# Patient Record
Sex: Male | Born: 1935 | Race: Black or African American | Hispanic: No | Smoking: Never smoker
Health system: Southern US, Community
[De-identification: ages and names within clinical notes are randomized; demographics above are authoritative.]

## PROBLEM LIST (undated history)

## (undated) DIAGNOSIS — E785 Hyperlipidemia, unspecified: Secondary | ICD-10-CM

## (undated) DIAGNOSIS — I739 Peripheral vascular disease, unspecified: Secondary | ICD-10-CM

## (undated) DIAGNOSIS — I779 Disorder of arteries and arterioles, unspecified: Secondary | ICD-10-CM

## (undated) DIAGNOSIS — R42 Dizziness and giddiness: Secondary | ICD-10-CM

## (undated) DIAGNOSIS — I341 Nonrheumatic mitral (valve) prolapse: Secondary | ICD-10-CM

## (undated) DIAGNOSIS — I10 Essential (primary) hypertension: Secondary | ICD-10-CM

## (undated) HISTORY — DX: Nonrheumatic mitral (valve) prolapse: I34.1

## (undated) HISTORY — DX: Essential (primary) hypertension: I10

## (undated) HISTORY — DX: Hyperlipidemia, unspecified: E78.5

## (undated) HISTORY — DX: Peripheral vascular disease, unspecified: I73.9

## (undated) HISTORY — DX: Disorder of arteries and arterioles, unspecified: I77.9

---

## 1985-06-12 HISTORY — PX: ABDOMINAL SURGERY: SHX537

## 2007-12-04 ENCOUNTER — Encounter: Admission: RE | Admit: 2007-12-04 | Discharge: 2007-12-04 | Payer: Self-pay | Admitting: Gastroenterology

## 2009-10-14 ENCOUNTER — Encounter: Payer: Self-pay | Admitting: Cardiology

## 2009-10-15 ENCOUNTER — Encounter: Payer: Self-pay | Admitting: Cardiology

## 2009-10-17 ENCOUNTER — Encounter: Payer: Self-pay | Admitting: Cardiology

## 2009-10-18 ENCOUNTER — Ambulatory Visit: Payer: Self-pay | Admitting: Cardiology

## 2009-10-19 ENCOUNTER — Encounter: Payer: Self-pay | Admitting: Cardiology

## 2009-11-03 ENCOUNTER — Ambulatory Visit: Payer: Self-pay | Admitting: Physician Assistant

## 2009-11-03 DIAGNOSIS — R079 Chest pain, unspecified: Secondary | ICD-10-CM

## 2009-11-03 DIAGNOSIS — E785 Hyperlipidemia, unspecified: Secondary | ICD-10-CM | POA: Insufficient documentation

## 2009-11-03 DIAGNOSIS — I10 Essential (primary) hypertension: Secondary | ICD-10-CM | POA: Insufficient documentation

## 2009-11-03 DIAGNOSIS — I25810 Atherosclerosis of coronary artery bypass graft(s) without angina pectoris: Secondary | ICD-10-CM | POA: Insufficient documentation

## 2009-11-03 DIAGNOSIS — I4510 Unspecified right bundle-branch block: Secondary | ICD-10-CM | POA: Insufficient documentation

## 2009-12-01 ENCOUNTER — Ambulatory Visit: Payer: Self-pay | Admitting: Vascular Surgery

## 2009-12-01 ENCOUNTER — Encounter: Payer: Self-pay | Admitting: Physician Assistant

## 2009-12-15 ENCOUNTER — Encounter: Admission: RE | Admit: 2009-12-15 | Discharge: 2009-12-15 | Payer: Self-pay | Admitting: Vascular Surgery

## 2009-12-15 ENCOUNTER — Encounter: Payer: Self-pay | Admitting: Cardiology

## 2009-12-15 ENCOUNTER — Ambulatory Visit: Payer: Self-pay | Admitting: Vascular Surgery

## 2010-02-07 ENCOUNTER — Ambulatory Visit: Payer: Self-pay | Admitting: Cardiology

## 2010-02-07 DIAGNOSIS — I739 Peripheral vascular disease, unspecified: Secondary | ICD-10-CM | POA: Insufficient documentation

## 2010-02-07 DIAGNOSIS — R0602 Shortness of breath: Secondary | ICD-10-CM

## 2010-02-16 ENCOUNTER — Encounter (INDEPENDENT_AMBULATORY_CARE_PROVIDER_SITE_OTHER): Payer: Self-pay | Admitting: *Deleted

## 2010-02-17 ENCOUNTER — Telehealth (INDEPENDENT_AMBULATORY_CARE_PROVIDER_SITE_OTHER): Payer: Self-pay | Admitting: *Deleted

## 2010-02-24 ENCOUNTER — Ambulatory Visit: Payer: Self-pay | Admitting: Cardiology

## 2010-02-24 ENCOUNTER — Encounter: Payer: Self-pay | Admitting: Cardiology

## 2010-02-28 ENCOUNTER — Ambulatory Visit: Payer: Self-pay | Admitting: Cardiology

## 2010-02-28 DIAGNOSIS — I059 Rheumatic mitral valve disease, unspecified: Secondary | ICD-10-CM

## 2010-07-12 NOTE — Progress Notes (Signed)
Summary: [facility] Oneida Alar  [facility]-Dr[facility] Fields   Imported By: Gurney Maxin, RN, BSN 12/03/2009 [id number]  _____________________________________________________________________  External Attachment:    Type:   Image     Comment:   External Document

## 2010-07-12 NOTE — Letter (Signed)
Summary: [facility]   [facility]   Imported By: Sallee Provencal 01/10/2010 16:13:15  _____________________________________________________________________  External Attachment:    Type:   Image     Comment:   External Document

## 2010-07-12 NOTE — Consult Note (Signed)
Summary: CARDIOLOGY CONSULT/ [place] [facility]/ [place]   Imported By: Delfino Lovett 11/02/2009 11:12:07  _____________________________________________________________________  External Attachment:    Type:   Image     Comment:   External Document

## 2010-07-12 NOTE — Letter (Signed)
Summary: [facility], general practice at [place]. [address]   [place], [place] [address]   Phone: [phone]  Fax: [phone]    02/16/2010  Jared Cruz [address], [place]  [address]  Dear Mr. Amodei,   Dr. Domenic Polite has requested that we schedule a 2-D Echo and a Pulmonary Function Test for you at [facility]. I have been unable to reach you by telephone. Please contact our office so that we may get these test set up for you.        Sincerely,   Delfino Lovett Patient Care Coordinator  [phone] ext.[place]
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
## 2010-07-12 NOTE — Letter (Signed)
Summary: [facility] D/C DR. Megan Salon  [place] D/C DR. Megan Salon   Imported By: Delfino Lovett 11/02/2009 11:07:58  _____________________________________________________________________  External Attachment:    Type:   Image     Comment:   External Document

## 2010-07-12 NOTE — Assessment & Plan Note (Signed)
Summary: 3 WK F/U PER 8/29 OV-JM   Visit Type:  Follow-up Primary Provider:  Dr. Olena Mater   History of Present Illness: 77 year old male presents for followup. I saw him back in late August. He underwent a followup 2-D echocardiogram as well as PFTs, reviewed below.  He reports NYHA class 2-3 dyspnea on exertion, no significant change. Reports improvement with use of inhalers. No chest pain or palpitations.  I reviewed the results of this testing, and discussed the implications. He will need followup of his valvular disease. Not entirely clear that this is symptom provoking at the present time.  Blood pressure is elevated today. He states that he does not check this at home. We did discuss other potential changes to his medications if this trend continues.  Preventive Screening-Counseling & Management  Alcohol-Tobacco     Smoking Status: never  Current Medications (verified): 1)  Proventil Hfa 108 (90 Base) Mcg/act Aers (Albuterol Sulfate) .... As Needed Every Four Hours 2)  Singulair 10 Mg Tabs (Montelukast Sodium) .... Take 1 Tablet By Mouth Once A Day 3)  Aspir-Low 81 Mg Tbec (Aspirin) .... Take 1 Tablet By Mouth Once A Day 4)  Amlodipine Besylate 10 Mg Tabs (Amlodipine Besylate) .... Take One Tablet By Mouth Daily 5)  Albuterol Sulfate (2.5 Mg/49ml) 0.083% Nebu (Albuterol Sulfate) .... Use As Directed 6)  Asmanex 60 Metered Doses 220 Mcg/inh Aepb (Mometasone Furoate) .... Use As Directed 7)  Epipen 2-Pak 0.3 Mg/0.33ml Devi (Epinephrine) .... Use As Directed 8)  Meloxicam 7.5 Mg Tabs (Meloxicam) .... Take 1 Tablet By Mouth Two Times A Day 9)  Simvastatin 20 Mg Tabs (Simvastatin) .... Take 1 Tablet By Mouth Once A Day  Allergies (verified): 1)  ! Ace Inhibitors  Comments:  Nurse/Medical Assistant: The patient is currently on medications but does not know the name or dosage at this time. Instructed to contact our office with details. Will update medication list at that  time.  Past History:  Social History: Last updated: 02/07/2010 Retired  Married  Tobacco Use - No Alcohol Use - no 5 grown children  Past Medical History: Asthma Hypertension Dyslipidemia Carotid artery disease Mitral Valve Prolapse - mild to moderate regurgitation Mild pulmonary hypertension  Review of Systems       The patient complains of dyspnea on exertion.  The patient denies anorexia, fever, weight loss, chest pain, syncope, peripheral edema, prolonged cough, melena, and hematochezia.         Otherwise reviewed and negative.  Vital Signs:  Patient profile:   77 year old male Height:      68 inches Weight:      179 pounds O2 Sat:      100 % on Room air Pulse rate:   63 / minute BP sitting:   155 / 87  (left arm) Cuff size:   regular  Vitals Entered By: Georgina Peer (February 28, 2010 8:33 AM)  O2 Flow:  Room air  Physical Exam  Additional Exam:  Overweight male in no acute distress. HEENT: Conjunctiva and lids normal, oropharynx with moist mucosa. NECK: Palpable pulses, soft right-sided bruit; no JVD; no TM. LUNGS: CTA bilaterally, decreased breath sounds, no wheezes. HEART: RRR (S1S2); split S2 with prominent P2; no significant murmurs; no rubs; no gallops. ABD: Soft, NT; intact BS. EXT: Intact distal pulses; no edema. SKIN: Warm, dry. MUSC: No obvious deformity. NEURO: A/O (x3), affect appropriate.    Echocardiogram  Procedure date:  02/24/2010  Findings:  LVEF [id number] with diastolic dysfunction, mild mitral valve prolapse, mild to moderate mitral regurgitation, mild to moderate right ventricular enlargement with moderate right atrial enlargement, mild to moderate tricuspid regurgitation, mild pulmonary hypertension with RVSP 44 mmHg.  Prior Report Reviewed for Stress Echocardiogram:  Findings: 10/18/2009 Stress Echocardiogram             Conclusions:         1. Patient followed a Bruce protocol. The patient exercised into stage          2 reaching peak heart rate 149 bpm, 101% MPHR.  The total exercise         duration was 04:36 minutes. The patient felt chest discomfort         consistent with angina. The study was terminated because of fatigue         and because peak was reached. There is lead motion artifact with         equivocal ST changes in leads II, III, aVF, V4-V6 near peak. The blood         pressure response is hypertensive. Exercise capacity is poor. The         patient achieved a level of 7 METS. This is an equivocal         electrocardiographic stress test.         2. This was a negative echocardiographic stress test. The left               ventricular chamber size is decreased. The left ventricle appears         hyperdynamic at peak without clear wall motion abnormalties to suggest         ischemia. The estimated ejection fraction is greater than 65%.                        Comments:    PFT  Procedure date:  02/24/2010  Findings:      FEC 2.1, 70% predicted. FEV1 1.9, 74% predicted. Normal expiratory loop. DLCO 16.9, 89% predicted.  Possible mild, primarily small airways obstruction. The FVC is reduced. This does suggest the possibility of a restrictive process.  Impression & Recommendations:  Problem # 1:  SHORTNESS OF BREATH (ICD-786.05)  Likely multifactorial. He does have evidence of small airways disease by PFTs, and reports improvement with use of inhalers. Consistent with reported history of asthma. He has evidence of increased right heart size as well as mild pulmonary hypertension by echocardiogram. Mitral valve prolapse is noted, although associated with only mild to moderate mitral regurgitation, and not clear if this is symptom provoking. Element of diastolic dysfunction and elevated blood pressures also to be considered.  His updated medication list for this problem includes:    Aspir-low 81 Mg Tbec (Aspirin) .Marland Kitchen... Take 1 tablet by mouth once a day    Amlodipine Besylate 10 Mg  Tabs (Amlodipine besylate) .Marland Kitchen... Take one tablet by mouth daily  Problem # 2:  HYPERTENSION (ICD-401.9)  Blood pressure elevated today. We discussed sodium restriction. He has an ACE inhibitor allergy. Next step might be considering a low dose diuretic such as HCTZ with potassium supplement. He plans to see Dr. Ree Edman soon for followup.  His updated medication list for this problem includes:    Aspir-low 81 Mg Tbec (Aspirin) .Marland Kitchen... Take 1 tablet by mouth once a day    Amlodipine Besylate 10 Mg Tabs (Amlodipine besylate) .Marland Kitchen... Take one tablet by mouth daily  Problem #  3:  MITRAL VALVE PROLAPSE (ICD-424.0)  Associated with mild to moderate mitral regurgitation. Continue clinical followup at this point.  Patient Instructions: 1)  Your physician recommends that you continue on your current medications as directed. Please refer to the Current Medication list given to you today. 2)  Follow up in  6 months

## 2010-07-12 NOTE — Progress Notes (Signed)
Summary: Pending Echo/PFT's  ---- Converted from flag ---- ---- 02/17/2010 8:41 AM, Delfino Lovett wrote:   ---- 02/16/2010 9:56 AM, Delfino Lovett wrote: LETTER MAILED TO PATIENT REQT THAT HE CALL OFFICE  ---- 02/07/2010 8:34 AM, Gurney Maxin, RN, BSN wrote: The following orders have been entered for this patient and placed on Admin Hold:  Type:     Referral       Code:   2D Echo Description:   2-D Echocardiogram Order Date:   02/07/2010   Authorized By:   Beckie Salts, MD, [facility] Order #:   [id number] Clinical Notes:    Type:     Referral       Code:   PFT Description:   Pulmonary Function Test Order Date:   02/07/2010   Authorized By:   Beckie Salts, MD, [facility] Order #:   [id number] Clinical Notes:   Bronchodilators? With ABG's? With DLCO? On Room Air? Does patient take Amiodorone? ------------------------------
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
## 2010-08-24 ENCOUNTER — Encounter: Payer: Self-pay | Admitting: *Deleted

## 2010-09-01 ENCOUNTER — Ambulatory Visit: Payer: Self-pay | Admitting: Cardiology

## 2010-09-20 ENCOUNTER — Other Ambulatory Visit: Payer: Self-pay | Admitting: Cardiology

## 2010-09-20 ENCOUNTER — Encounter: Payer: Self-pay | Admitting: Cardiology

## 2010-09-20 ENCOUNTER — Ambulatory Visit (INDEPENDENT_AMBULATORY_CARE_PROVIDER_SITE_OTHER): Payer: Medicare Other | Admitting: Cardiology

## 2010-09-20 VITALS — BP 142/82 | HR 75 | Ht 68.0 in | Wt 186.0 lb

## 2010-09-20 DIAGNOSIS — E782 Mixed hyperlipidemia: Secondary | ICD-10-CM

## 2010-09-20 DIAGNOSIS — I059 Rheumatic mitral valve disease, unspecified: Secondary | ICD-10-CM

## 2010-09-20 DIAGNOSIS — R0602 Shortness of breath: Secondary | ICD-10-CM

## 2010-09-20 DIAGNOSIS — J45909 Unspecified asthma, uncomplicated: Secondary | ICD-10-CM

## 2010-09-20 DIAGNOSIS — I10 Essential (primary) hypertension: Secondary | ICD-10-CM

## 2010-09-20 NOTE — Patient Instructions (Signed)
   2D Echo   Your physician wants you to follow up in: 6 months.  You will receive a reminder letter in the mail one-two months in advance.  If you don't receive a letter, please call our office to schedule the follow up appointment

## 2010-09-20 NOTE — Assessment & Plan Note (Signed)
Continue medical therapy. We also discussed sodium restriction. A low dose diuretic such as HCTZ might be considered. He has an ACE inhibitor allergy.

## 2010-09-20 NOTE — Assessment & Plan Note (Signed)
Relatively stable, and likely multifactorial. Continue observation.

## 2010-09-20 NOTE — Progress Notes (Signed)
Clinical Summary Jared Cruz is a 77 y.o.male presenting for followup. He was last seen in September 2011.  He reports no progressive problems with shortness of breath. He has had some issues with the strong pollen season however. No reported palpitations or chest pain.  Complains of significant left knee pain, plans to discuss this with his primary care physician. He has not been using regular pain medications other than over-the-counter Tylenol.  I rechecked his blood pressure today during his visit today, noted below. He reports compliance with his medications. We did discuss sodium restriction, also possibility of additional medical therapy.  Last echocardiogram as noted below.   Allergies  Allergen Reactions  . Ace Inhibitors     Current outpatient prescriptions:albuterol (PROVENTIL) (2.5 MG/3ML) 0.083% nebulizer solution, Take 2.5 mg by nebulization every 6 (six) hours as needed.  , Disp: , Rfl: ;  albuterol (PROVENTIL) 90 MCG/ACT inhaler, Inhale 2 puffs into the lungs every 6 (six) hours as needed.  , Disp: , Rfl: ;  amLODipine (NORVASC) 10 MG tablet, Take 10 mg by mouth daily.  , Disp: , Rfl: ;  aspirin 81 MG EC tablet, Take 81 mg by mouth daily.  , Disp: , Rfl:  EPINEPHrine (EPIPEN JR) 0.15 MG/0.3ML injection, Inject 0.15 mg into the muscle as needed.  , Disp: , Rfl: ;  HYDROcodone-acetaminophen (VICODIN) 5-500 MG per tablet, Take 1 tablet by mouth every 6 (six) hours as needed.  , Disp: , Rfl: ;  mometasone (ASMANEX 60 METERED DOSES) 220 MCG/INH inhaler, Inhale 2 puffs into the lungs daily. As directed  , Disp: , Rfl: ;  montelukast (SINGULAIR) 10 MG tablet, Take 10 mg by mouth at bedtime.  , Disp: , Rfl:  DISCONTD: meloxicam (MOBIC) 7.5 MG tablet, Take 7.5 mg by mouth 2 (two) times daily.  , Disp: , Rfl: ;  DISCONTD: simvastatin (ZOCOR) 20 MG tablet, Take 20 mg by mouth at bedtime.  , Disp: , Rfl:   Past Medical History  Diagnosis Date  . Asthma     PFT 9/11 - FEC 2.1, 70%  predicted; FEV1 1.9, 74% predicted; Normal expiratory loop; DLCO 16.9, 89% predicted   . Dyslipidemia   . Carotid artery disease   . Mitral valve prolapse     Mild to moderate regurgitation  . Essential hypertension, benign     Social History Jared Cruz reports that he has never smoked. He has never used smokeless tobacco. Jared Cruz reports that he does not drink alcohol.  Review of Systems Outlined above, otherwise reviewed and negative.  Physical Examination Filed Vitals:   09/20/10 1211  BP: 142/82  Pulse: 75  Overweight male in no acute distress. HEENT: Conjunctiva and lids normal, oropharynx with moist mucosa. NECK: Palpable pulses, soft right-sided bruit; no JVD; no TM. LUNGS: CTA bilaterally, decreased breath sounds, no wheezes. HEART: RRR (S1S2); split S2 with prominent P2; no significant murmurs; no rubs; no gallops. ABD: Soft, NT; intact BS. EXT: Intact distal pulses; no edema. SKIN: Warm, dry. MUSC: No obvious deformity. NEURO: A/O (x3), affect appropriate.   ECG Normal sinus rhythm at 79 beats per minute with left atrial enlargement, right bundle branch block, nonspecific ST-T changes.  Studies Echocardiogram from 02/24/2010 showed [id number] with diastolic dysfunction, mild mitral valve prolapse, mild to moderate mitral regurgitation, mild to moderate right ventricular enlargement with moderate right atrial enlargement, mild to moderate tricuspid regurgitation, mild pulmonary hypertension with RVSP 44 mmHg.  Problem List and Plan

## 2010-09-20 NOTE — Assessment & Plan Note (Signed)
A followup echocardiogram will be planned to ensure stability in both mitral valve disease and also pulmonary hypertension. If this is stable, can likely go to an annual followup.

## 2010-09-22 DIAGNOSIS — I059 Rheumatic mitral valve disease, unspecified: Secondary | ICD-10-CM

## 2010-09-23 ENCOUNTER — Encounter: Payer: Self-pay | Admitting: Cardiology

## 2010-09-29 ENCOUNTER — Telehealth: Payer: Self-pay | Admitting: *Deleted

## 2010-09-29 NOTE — Telephone Encounter (Signed)
Pt notified of Echo results and verbalized understanding.

## 2010-09-29 NOTE — Telephone Encounter (Signed)
Message copied by Gurney Maxin on Thu Sep 29, 2010  3:51 PM ------      Message from: Rozann Lesches      Created: Wed Sep 28, 2010  4:49 PM       Reviewed report. Mitral valve prolapse with moderate mitral regurgitation, stable. RVSP is also stable. We can go to a one-year followup, sooner as needed.

## 2010-10-11 ENCOUNTER — Encounter: Payer: Self-pay | Admitting: Cardiology
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
## 2010-10-25 NOTE — Assessment & Plan Note (Signed)
OFFICE VISIT   Jared Cruz, Jared Cruz  DOB:  February 11, 1934                                       12/15/2009  [id number]   The patient returns for followup today.  He was recently seen for  evaluation of possible subclavian stenosis with some symptoms of  dizziness.  He states today that the dizziness primarily occurs when he  goes from a lying to sitting or standing position.  It sounds like  orthostasis.  His CT angiogram of the neck and chest was performed  today, which shows no significant subclavian artery stenosis, no carotid  stenosis or no vertebral stenosis.   On physical exam today, blood pressure is 130/76 in the right arm,  132/77 in the left arm.  Heart rate is 87 regular.  Temperature is 97.9.  Upper extremity vascular exam shows 2+ brachial and radial pulses  bilaterally.   I reviewed the CT angiogram of his neck and chest today and concur with  the findings of radiology that there is no significant great vessel or  vertebral artery stenosis.  I discussed with the patient about staying  well-hydrated as it seems he has some component of orthostasis that  contributes to his dizziness.  We refilled his prescription for  amlodipine with 3 months of refills today.  He will return to see me on  an as-needed basis.     Jessy Oto. Fields, MD  Electronically Signed   CEF/MEDQ  D:  12/15/2009  T:  12/16/2009  Job:  [id number]   cc:   Carlena Bjornstad, MD, [facility]  Margarita Rana, M.D.  Satira Sark, MD

## 2010-12-11 LAB — HM PAP SMEAR

## 2011-08-15 ENCOUNTER — Other Ambulatory Visit: Payer: Self-pay | Admitting: Family Medicine

## 2011-08-15 ENCOUNTER — Encounter (HOSPITAL_COMMUNITY): Payer: Self-pay

## 2011-08-15 ENCOUNTER — Ambulatory Visit (HOSPITAL_COMMUNITY)
Admission: RE | Admit: 2011-08-15 | Discharge: 2011-08-15 | Disposition: A | Discharge status: Home / Self Care | Payer: Medicare Other | Source: Ambulatory Visit | Attending: Family Medicine | Admitting: Family Medicine

## 2011-08-15 DIAGNOSIS — R51 Headache: Secondary | ICD-10-CM

## 2011-08-15 DIAGNOSIS — I10 Essential (primary) hypertension: Secondary | ICD-10-CM

## 2011-08-15 DIAGNOSIS — R42 Dizziness and giddiness: Secondary | ICD-10-CM

## 2011-10-04 ENCOUNTER — Ambulatory Visit: Payer: Medicare Other | Admitting: Cardiology

## 2011-10-04 ENCOUNTER — Encounter: Payer: Self-pay | Admitting: Cardiology

## 2011-11-14 ENCOUNTER — Ambulatory Visit: Payer: Medicare Other | Admitting: Cardiology

## 2011-12-15 ENCOUNTER — Other Ambulatory Visit: Payer: Self-pay

## 2011-12-27 ENCOUNTER — Encounter: Payer: Self-pay | Admitting: Cardiology

## 2011-12-27 ENCOUNTER — Ambulatory Visit (INDEPENDENT_AMBULATORY_CARE_PROVIDER_SITE_OTHER): Payer: Medicare Other | Admitting: Cardiology

## 2011-12-27 VITALS — BP 125/81 | HR 89 | Ht 68.0 in | Wt 189.0 lb

## 2011-12-27 DIAGNOSIS — I059 Rheumatic mitral valve disease, unspecified: Secondary | ICD-10-CM

## 2011-12-27 DIAGNOSIS — I10 Essential (primary) hypertension: Secondary | ICD-10-CM

## 2011-12-27 NOTE — Progress Notes (Signed)
   Clinical Summary Jared Cruz is a 78 y.o.male presenting for followup. He was seen in April 2012. Symptomatically, he reports no progressive shortness of breath, palpitations, syncope. Reports compliance with his medications. He has had adjustments made over time, blood pressure looks good today.  Echocardiogram from 4/12 showed [id number] with diastolic dysfunction, mild dilated right ventricle with mildly reduced function, mild aortic regurgitation, mild MVP with moderate regurgitation, mild tricuspid regurgitation, RVSP 40-45 mm mercury, mild to moderate pulmonic regurgitation, mild dilated ascending aorta. We reviewed this again today.   Allergies  Allergen Reactions  . Ace Inhibitors     Current Outpatient Prescriptions  Medication Sig Dispense Refill  . albuterol (PROVENTIL) (2.5 MG/3ML) 0.083% nebulizer solution Take 2.5 mg by nebulization daily.       Marland Kitchen albuterol (PROVENTIL) 90 MCG/ACT inhaler Inhale 2 puffs into the lungs 2 (two) times daily.       Marland Kitchen amLODipine (NORVASC) 10 MG tablet Take 10 mg by mouth daily.        Marland Kitchen aspirin 81 MG EC tablet Take 81 mg by mouth daily.        Marland Kitchen ezetimibe (ZETIA) 10 MG tablet Take 10 mg by mouth daily.      . Flaxseed, Linseed, (FLAX SEED OIL) 1000 MG CAPS Take 1 capsule by mouth 2 (two) times daily.      . montelukast (SINGULAIR) 10 MG tablet Take 10 mg by mouth daily.       . Pitavastatin Calcium (LIVALO) 4 MG TABS Take 1 tablet by mouth every morning.      Marland Kitchen EPINEPHrine (EPIPEN JR) 0.15 MG/0.3ML injection Inject 0.15 mg into the muscle as needed.        . mometasone (ASMANEX 60 METERED DOSES) 220 MCG/INH inhaler Inhale 2 puffs into the lungs daily. As directed          Past Medical History  Diagnosis Date  . Asthma     PFT 9/11 - FEC 2.1, 70% predicted; FEV1 1.9, 74% predicted; Normal expiratory loop; DLCO 16.9, 89% predicted   . Dyslipidemia   . Carotid artery disease   . Mitral valve prolapse     Mild to moderate regurgitation  .  Essential hypertension, benign   . Hypertension     Social History Jared Cruz reports that he has never smoked. He has never used smokeless tobacco. Jared Cruz reports that he does not drink alcohol.  Review of Systems Negative except as outlined above.  Physical Examination Filed Vitals:   12/27/11 1404  BP: 125/81  Pulse: 89   Overweight male in no acute distress.  HEENT: Conjunctiva and lids normal, oropharynx clear with moist mucosa. Neck: Supple, no elevated JVP, no thyromegaly. Lungs: Clear to auscultation, nonlabored breathing at rest. Cardiac: Regular rate and rhythm, no S3 with soft systolic murmur, soft click, no pericardial rub. Abdomen: Soft, nontender, bowel sounds present, no guarding or rebound. Extremities: No pitting edema, distal pulses 2+. Skin: Warm and dry. Musculoskeletal: No kyphosis. Neuropsychiatric: Alert and oriented x3, affect grossly appropriate.   ECG Normal sinus rhythm with left atrial enlargement and right bundle branch block.  Problem List and Plan   MITRAL VALVE PROLAPSE Symptomatically stable. Echocardiogram from last year showed mild prolapse with moderate regurgitation. Followup echocardiogram will be obtained.  HYPERTENSION Reasonably good blood pressure control noted today. No changes made.    Satira Sark, M.D., F.A.C.C.

## 2011-12-27 NOTE — Assessment & Plan Note (Signed)
Reasonably good blood pressure control noted today. No changes made.

## 2011-12-27 NOTE — Assessment & Plan Note (Signed)
Symptomatically stable. Echocardiogram from last year showed mild prolapse with moderate regurgitation. Followup echocardiogram will be obtained.

## 2011-12-27 NOTE — Patient Instructions (Addendum)
Your physician recommends that you schedule a follow-up appointment in: 1 year with Dr. Domenic Polite. You will receive a reminder letter in the mail in about 10 months reminding you to call and schedule your appointment. If you don't receive this letter, please contact our office. Your physician recommends that you continue on your current medications as directed. Please refer to the Current Medication list given to you today.  Your physician has requested that you have an echocardiogram. Echocardiography is a painless test that uses sound waves to create images of your heart. It provides your doctor with information about the size and shape of your heart and how well your heart's chambers and valves are working. This procedure takes approximately one hour. There are no restrictions for this procedure.

## 2012-01-10 ENCOUNTER — Other Ambulatory Visit: Payer: Medicare Other

## 2012-01-24 ENCOUNTER — Other Ambulatory Visit (INDEPENDENT_AMBULATORY_CARE_PROVIDER_SITE_OTHER): Payer: Medicare Other

## 2012-01-24 ENCOUNTER — Other Ambulatory Visit: Payer: Self-pay

## 2012-01-24 DIAGNOSIS — I059 Rheumatic mitral valve disease, unspecified: Secondary | ICD-10-CM

## 2012-08-08 LAB — BASIC METABOLIC PANEL
Creatinine: 1.4 mg/dL — AB (ref 0.6–1.3)
Glucose: 82 mg/dL
Potassium: 4.9 mmol/L (ref 3.4–5.3)

## 2012-08-08 LAB — LIPID PANEL
HDL: 52 mg/dL (ref 35–70)
Triglycerides: 85 mg/dL (ref 40–160)

## 2012-08-08 LAB — HEPATIC FUNCTION PANEL
AST: 13 U/L — AB (ref 14–40)
Alkaline Phosphatase: 42 U/L (ref 25–125)

## 2012-09-18 ENCOUNTER — Encounter: Payer: Self-pay | Admitting: Family Medicine

## 2012-09-18 ENCOUNTER — Ambulatory Visit (INDEPENDENT_AMBULATORY_CARE_PROVIDER_SITE_OTHER): Payer: Medicare Other | Admitting: Family Medicine

## 2012-09-18 VITALS — BP 105/67 | HR 70 | Temp 97.4°F | Ht 67.5 in | Wt 186.0 lb

## 2012-09-18 DIAGNOSIS — N184 Chronic kidney disease, stage 4 (severe): Secondary | ICD-10-CM

## 2012-09-18 DIAGNOSIS — N1831 Chronic kidney disease, stage 3a: Secondary | ICD-10-CM | POA: Insufficient documentation

## 2012-09-18 DIAGNOSIS — I059 Rheumatic mitral valve disease, unspecified: Secondary | ICD-10-CM

## 2012-09-18 DIAGNOSIS — N1832 Chronic kidney disease, stage 3b: Secondary | ICD-10-CM | POA: Insufficient documentation

## 2012-09-18 DIAGNOSIS — E785 Hyperlipidemia, unspecified: Secondary | ICD-10-CM

## 2012-09-18 DIAGNOSIS — I10 Essential (primary) hypertension: Secondary | ICD-10-CM

## 2012-09-18 DIAGNOSIS — J45909 Unspecified asthma, uncomplicated: Secondary | ICD-10-CM | POA: Insufficient documentation

## 2012-09-18 NOTE — Assessment & Plan Note (Signed)
Blood pressure now well controlled. Continue present medications

## 2012-09-18 NOTE — Assessment & Plan Note (Signed)
Stable labs reviewed

## 2012-09-18 NOTE — Progress Notes (Signed)
  Subjective:    Patient ID: Jared Cruz, male    DOB: [birth year], 79 y.o.   MRN: [id number]  HPI Patient is here for follow up of hypertension: deniesHeadache;deniesChest Pain;deniesweakness; has Shortness of Breath or Orthopnea;deniesVisual changes;deniespalpitations;admits tocough;deniespedal edema;deniessymptoms of TIA or stroke; admits toCompliance with medications. deniesProblems with medications. Has had flare up of his asthma recently. He experienced and wheezing and coughing. He has uses asthma medications, which included Qvar and albuterol. In this resolve the issue.He is back to baseline.   PMH/PSH: reviewed/updated in Epic  SH/FH: reviewed/updated in Epic  Allergies: reviewed/updated in Epic  Medications: reviewed/updated in Epic  Immunizations: reviewed/updated in Epic     Review of Systems  Constitutional: Negative.   HENT: Negative.   Eyes: Negative.   Respiratory: Positive for shortness of breath (asthma attack recently). Negative for cough, chest tightness and wheezing.   Gastrointestinal: Negative.   Endocrine: Negative.   Genitourinary: Negative.   Musculoskeletal: Negative.   Skin: Negative.   Allergic/Immunologic: Negative.   Neurological: Negative.   Hematological: Negative.   Psychiatric/Behavioral: Negative.        Objective:   Physical Exam On examination he appeared in good health and spirits. No distress. Anicteric, Acyanotic Vital signs as documented. BP 105/67  Pulse 70  Temp(Src) 97.4 F (36.3 C) (Oral)  Ht 5' 7.5" (1.715 m)  Wt 186 lb (84.369 kg)  BMI 28.68 kg/m2  Skin warm and dry and without overt rashes.  Head,Ears,Eyes,Throat: normal Neck without JVD.  Lungs clear in apices. Occasional wheeze right lower quadrant. No rales. Heart exam notable for regular rhythm, normal sounds and absence of murmurs, rubs or gallops. Abdomen unremarkable and without evidence of organomegaly, masses, or abdominal aortic  enlargement. GU: Extremities nonedematous. Neurologic:nonfocal       Assessment & Plan:  HYPERTENSION Blood pressure now well controlled. Continue present medications  Chronic kidney disease (CKD), stage IV (severe) Stable labs reviewed  Unspecified asthma Recent asthma flare. Patient treated appropriately. Does need to continue using the Qvar. And the albuterol as a rescue drug.  DYSLIPIDEMIA Stable   Plans Orders Placed This Encounter  Procedures  . HM PAP SMEAR    This external order was created through the Results Console.  . Basic metabolic panel    This external order was created through the Results Console.  . Lipid panel    This external order was created through the Results Console.  . Hepatic function panel    This external order was created through the Results Console.   No results found for this or any previous visit (from the past 24 hour(s)).                                Meds ordered this encounter  Medications  . cloNIDine (CATAPRES) 0.1 MG tablet    Sig: Take 0.1 mg by mouth 2 (two) times daily.  . beclomethasone (QVAR) 40 MCG/ACT inhaler    Sig: Inhale 2 puffs into the lungs 2 (two) times daily.   continue present medications. Continue Qvar. Continues in the albuterol as a rescue drug. Current clinic in 2 months. On the return visit patient should be fasting for lab work.  Francis P. Jacelyn Grip, M.D.

## 2012-09-18 NOTE — Assessment & Plan Note (Signed)
Recent asthma flare. Patient treated appropriately. Does need to continue using the Qvar. And the albuterol as a rescue drug.

## 2012-09-18 NOTE — Assessment & Plan Note (Signed)
Stable

## 2012-09-24 ENCOUNTER — Other Ambulatory Visit: Payer: Self-pay | Admitting: Family Medicine

## 2012-10-11 ENCOUNTER — Other Ambulatory Visit: Payer: Self-pay | Admitting: Family Medicine

## 2012-10-18 ENCOUNTER — Other Ambulatory Visit: Payer: Self-pay

## 2012-10-18 MED ORDER — PITAVASTATIN CALCIUM 4 MG PO TABS: 1.0000 | ORAL_TABLET | Freq: Every day | ORAL | Status: DC

## 2012-11-21 ENCOUNTER — Ambulatory Visit (INDEPENDENT_AMBULATORY_CARE_PROVIDER_SITE_OTHER): Payer: Medicare Other | Admitting: Family Medicine

## 2012-11-21 ENCOUNTER — Encounter: Payer: Self-pay | Admitting: Family Medicine

## 2012-11-21 VITALS — BP 169/81 | HR 67 | Temp 97.6°F | Wt 187.4 lb

## 2012-11-21 DIAGNOSIS — I739 Peripheral vascular disease, unspecified: Secondary | ICD-10-CM

## 2012-11-21 DIAGNOSIS — N184 Chronic kidney disease, stage 4 (severe): Secondary | ICD-10-CM

## 2012-11-21 DIAGNOSIS — E785 Hyperlipidemia, unspecified: Secondary | ICD-10-CM

## 2012-11-21 DIAGNOSIS — J45909 Unspecified asthma, uncomplicated: Secondary | ICD-10-CM

## 2012-11-21 DIAGNOSIS — I6529 Occlusion and stenosis of unspecified carotid artery: Secondary | ICD-10-CM

## 2012-11-21 DIAGNOSIS — I059 Rheumatic mitral valve disease, unspecified: Secondary | ICD-10-CM

## 2012-11-21 DIAGNOSIS — I10 Essential (primary) hypertension: Secondary | ICD-10-CM

## 2012-11-21 DIAGNOSIS — I4510 Unspecified right bundle-branch block: Secondary | ICD-10-CM

## 2012-11-21 LAB — COMPLETE METABOLIC PANEL WITH GFR
ALT: 10 U/L (ref 0–53)
AST: 13 U/L (ref 0–37)
Albumin: 4.3 g/dL (ref 3.5–5.2)
Alkaline Phosphatase: 52 U/L (ref 39–117)
BUN: 13 mg/dL (ref 6–23)
CO2: 27 mEq/L (ref 19–32)
Calcium: 9.7 mg/dL (ref 8.4–10.5)
Chloride: 103 mEq/L (ref 96–112)
Creat: 1.14 mg/dL (ref 0.50–1.35)
GFR, Est African American: 72 mL/min
GFR, Est Non African American: 62 mL/min
Glucose, Bld: 71 mg/dL (ref 70–99)
Potassium: 4.2 mEq/L (ref 3.5–5.3)
Sodium: 140 mEq/L (ref 135–145)
Total Bilirubin: 0.4 mg/dL (ref 0.3–1.2)
Total Protein: 7.5 g/dL (ref 6.0–8.3)

## 2012-11-21 LAB — TSH: TSH: 2.148 u[IU]/mL (ref 0.350–4.500)

## 2012-11-21 MED ORDER — CLONIDINE HCL 0.1 MG PO TABS: 0.1000 mg | ORAL_TABLET | Freq: Three times a day (TID) | ORAL | Status: DC

## 2012-11-21 MED ORDER — EZETIMIBE 10 MG PO TABS: 10.0000 mg | ORAL_TABLET | Freq: Every day | ORAL | Status: DC

## 2012-11-21 NOTE — Patient Instructions (Addendum)
DASH Diet  The DASH diet stands for "Dietary Approaches to Stop Hypertension." It is a healthy eating plan that has been shown to reduce high blood pressure (hypertension) in as little as 14 days, while also possibly providing other significant health benefits. These other health benefits include reducing the risk of breast cancer after menopause and reducing the risk of type 2 diabetes, heart disease, colon cancer, and stroke. Health benefits also include weight loss and slowing kidney failure in patients with chronic kidney disease.   DIET GUIDELINES  · Limit salt (sodium). Your diet should contain less than 1500 mg of sodium daily.  · Limit refined or processed carbohydrates. Your diet should include mostly whole grains. Desserts and added sugars should be used sparingly.  · Include small amounts of heart-healthy fats. These types of fats include nuts, oils, and tub margarine. Limit saturated and trans fats. These fats have been shown to be harmful in the body.  CHOOSING FOODS   The following food groups are based on a 2000 calorie diet. See your Registered Dietitian for individual calorie needs.  Grains and Grain Products (6 to 8 servings daily)  · Eat More Often: Whole-wheat bread, brown rice, whole-grain or wheat pasta, quinoa, popcorn without added fat or salt (air popped).  · Eat Less Often: White bread, white pasta, white rice, cornbread.  Vegetables (4 to 5 servings daily)  · Eat More Often: Fresh, frozen, and canned vegetables. Vegetables may be raw, steamed, roasted, or grilled with a minimal amount of fat.  · Eat Less Often/Avoid: Creamed or fried vegetables. Vegetables in a cheese sauce.  Fruit (4 to 5 servings daily)  · Eat More Often: All fresh, canned (in natural juice), or frozen fruits. Dried fruits without added sugar. One hundred percent fruit juice (½ cup [237 mL] daily).  · Eat Less Often: Dried fruits with added sugar. Canned fruit in light or heavy syrup.  Lean Meats, Fish, and Poultry (2  servings or less daily. One serving is 3 to 4 oz [85-114 g]).  · Eat More Often: Ninety percent or leaner ground beef, tenderloin, sirloin. Round cuts of beef, chicken breast, turkey breast. All fish. Grill, bake, or broil your meat. Nothing should be fried.  · Eat Less Often/Avoid: Fatty cuts of meat, turkey, or chicken leg, thigh, or wing. Fried cuts of meat or fish.  Dairy (2 to 3 servings)  · Eat More Often: Low-fat or fat-free milk, low-fat plain or light yogurt, reduced-fat or part-skim cheese.  · Eat Less Often/Avoid: Milk (whole, 2%). Whole milk yogurt. Full-fat cheeses.  Nuts, Seeds, and Legumes (4 to 5 servings per week)  · Eat More Often: All without added salt.  · Eat Less Often/Avoid: Salted nuts and seeds, canned beans with added salt.  Fats and Sweets (limited)  · Eat More Often: Vegetable oils, tub margarines without trans fats, sugar-free gelatin. Mayonnaise and salad dressings.  · Eat Less Often/Avoid: Coconut oils, palm oils, butter, stick margarine, cream, half and half, cookies, candy, pie.  FOR MORE INFORMATION  The Dash Diet Eating Plan: www.dashdiet.org  Document Released: 05/18/2011 Document Revised: 08/21/2011 Document Reviewed: 05/18/2011  [employer]® Patient Information ©2014 [employer].

## 2012-11-21 NOTE — Progress Notes (Signed)
Patient ID: Jared Cruz, male   DOB: 25-Jan-1934, 79 y.o.   MRN: [id number] SUBJECTIVE: CC: Chief Complaint  Patient presents with  . Follow-up    2 MONTH FOLLOW UP  . Medication Refill    CLONIDINE AND ZETIA    HPI: Patient is here for follow up of hyperlipidemia/hypertension: Feeling good, no problems taking meds as instructed denies Headache;denies Chest Pain;denies weakness;denies Shortness of Breath and orthopnea;denies Visual changes;denies palpitations;denies cough;denies pedal edema;denies symptoms of TIA or stroke;deniesClaudication symptoms. admits to Compliance with medications; denies Problems with medications.    PMH/PSH: reviewed/updated in Epic  SH/FH: reviewed/updated in Epic  Allergies: reviewed/updated in Epic  Medications: reviewed/updated in Epic  Immunizations: reviewed/updated in Epic  ROS: As above in the HPI. All other systems are stable or negative.  OBJECTIVE: APPEARANCE:  Patient in no acute distress.The patient appeared well nourished and normally developed. Acyanotic. Waist: VITAL SIGNS:BP 169/81  Pulse 67  Temp(Src) 97.6 F (36.4 C) (Oral)  Wt 187 lb 6.4 oz (85.004 kg)  BMI 28.9 kg/m2 AAM  SKIN: warm and  Dry without overt rashes, tattoos and scars  HEAD and Neck: without JVD, Head and scalp: normal Eyes:No scleral icterus. Fundi normal, eye movements normal. Ears: Auricle normal, canal normal, Tympanic membranes normal, insufflation normal. Nose: normal Throat: normal Neck & thyroid: normal  CHEST & LUNGS: Chest wall: normal Lungs: Clear  CVS: Reveals the PMI to be normally located. Regular rhythm, First and Second Heart sounds are split and prominent 1/6 murmurs,no rubs or gallops. Peripheral vasculature: Radial pulses: normal  ABDOMEN:  Appearance: normal Benign, no organomegaly, no masses, no Abdominal Aortic enlargement. No Guarding , no rebound. No Bruits. Bowel sounds: normal  RECTAL: N/A GU:  N/A  EXTREMETIES: nonedematous.  MUSCULOSKELETAL:  Spine: normal Joints: intact  NEUROLOGIC: oriented to time,place and person; nonfocal. Strength is normal Sensory is normal Reflexes are normal Cranial Nerves are normal. Results for orders placed in visit on 09/18/12  HM PAP SMEAR      Result Value Range   HM Pap smear [id number]    BASIC METABOLIC PANEL      Result Value Range   Glucose 82     BUN 13  4 - 21 mg/dL   Creatinine 1.4 (*) 0.6 - 1.3 mg/dL   Potassium 4.9  3.4 - 5.3 mmol/L   Sodium 140  137 - 147 mmol/L  LIPID PANEL      Result Value Range   Triglycerides 85  40 - 160 mg/dL   Cholesterol 203 (*) 0 - 200 mg/dL   HDL 52  35 - 70 mg/dL   LDL Cholesterol 134    HEPATIC FUNCTION PANEL      Result Value Range   Alkaline Phosphatase 42  25 - 125 U/L   ALT 9 (*) 10 - 40 U/L   AST 13 (*) 14 - 40 U/L   Bilirubin, Total 0.5      ASSESSMENT: HYPERTENSION - Plan: cloNIDine (CATAPRES) 0.1 MG tablet, COMPLETE METABOLIC PANEL WITH GFR, TSH  Unspecified asthma  MITRAL VALVE PROLAPSE  DYSLIPIDEMIA - Plan: ezetimibe (ZETIA) 10 MG tablet, COMPLETE METABOLIC PANEL WITH GFR, NMR Lipoprofile with Lipids  Chronic kidney disease (CKD), stage IV (severe)  RBBB  PVD  Occlusion and stenosis of carotid artery without mention of cerebral infarction, unspecified laterality    PLAN:  Orders Placed This Encounter  Procedures  . COMPLETE METABOLIC PANEL WITH GFR  . NMR Lipoprofile with Lipids  . TSH  Meds ordered this encounter  Medications  . ezetimibe (ZETIA) 10 MG tablet    Sig: Take 1 tablet (10 mg total) by mouth daily.    Dispense:  30 tablet    Refill:  11  . cloNIDine (CATAPRES) 0.1 MG tablet    Sig: Take 1 tablet (0.1 mg total) by mouth 3 (three) times daily.    Dispense:  90 tablet    Refill:  3  clonidine was increased Discussed with patient.  DASH Diet in the AVS.  Return in about 4 weeks (around 12/19/2012) for Recheck medical problems, recheck  BP.  Cortavious Nix P. Jacelyn Grip, M.D.

## 2012-11-22 ENCOUNTER — Other Ambulatory Visit: Payer: Self-pay | Admitting: Family Medicine

## 2012-11-22 ENCOUNTER — Telehealth: Payer: Self-pay | Admitting: Family Medicine

## 2012-11-22 DIAGNOSIS — E785 Hyperlipidemia, unspecified: Secondary | ICD-10-CM

## 2012-11-22 LAB — NMR LIPOPROFILE WITH LIPIDS
Cholesterol, Total: 195 mg/dL (ref <200)
HDL Particle Number: 33 umol/L (ref >=30.5)
HDL Size: 8.8 nm — ABNORMAL LOW (ref >=9.2)
HDL-C: 46 mg/dL (ref >=40)
LDL (calc): 114 mg/dL — ABNORMAL HIGH (ref <100)
LDL Particle Number: 1659 nmol/L — ABNORMAL HIGH (ref <1000)
LDL Size: 20.3 nm — ABNORMAL LOW (ref >20.5)
LP-IR Score: 66 — ABNORMAL HIGH (ref <=45)
Large HDL-P: 5.2 umol/L (ref >=4.8)
Large VLDL-P: 5.4 nmol/L — ABNORMAL HIGH (ref <=2.7)
Small LDL Particle Number: 1029 nmol/L — ABNORMAL HIGH (ref <=527)
Triglycerides: 177 mg/dL — ABNORMAL HIGH (ref <150)
VLDL Size: 49.3 nm — ABNORMAL HIGH (ref <=46.6)

## 2012-11-22 MED ORDER — FENOFIBRATE 48 MG PO TABS: 48.0000 mg | ORAL_TABLET | Freq: Every day | ORAL | Status: DC

## 2012-12-05 ENCOUNTER — Telehealth: Payer: Self-pay | Admitting: Family Medicine

## 2012-12-06 NOTE — Telephone Encounter (Signed)
appt given for 7/14

## 2012-12-23 ENCOUNTER — Ambulatory Visit (INDEPENDENT_AMBULATORY_CARE_PROVIDER_SITE_OTHER): Payer: Medicare Other | Admitting: Family Medicine

## 2012-12-23 ENCOUNTER — Encounter: Payer: Self-pay | Admitting: Family Medicine

## 2012-12-23 VITALS — BP 162/85 | HR 72 | Temp 97.7°F | Wt 186.2 lb

## 2012-12-23 DIAGNOSIS — IMO0002 Reserved for concepts with insufficient information to code with codable children: Secondary | ICD-10-CM

## 2012-12-23 DIAGNOSIS — E785 Hyperlipidemia, unspecified: Secondary | ICD-10-CM

## 2012-12-23 DIAGNOSIS — S39012A Strain of muscle, fascia and tendon of lower back, initial encounter: Secondary | ICD-10-CM

## 2012-12-23 DIAGNOSIS — I739 Peripheral vascular disease, unspecified: Secondary | ICD-10-CM

## 2012-12-23 DIAGNOSIS — I10 Essential (primary) hypertension: Secondary | ICD-10-CM

## 2012-12-23 DIAGNOSIS — N184 Chronic kidney disease, stage 4 (severe): Secondary | ICD-10-CM

## 2012-12-23 MED ORDER — CYCLOBENZAPRINE HCL 5 MG PO TABS: 5.0000 mg | ORAL_TABLET | Freq: Three times a day (TID) | ORAL | Status: DC | PRN

## 2012-12-23 MED ORDER — ALBUTEROL SULFATE (2.5 MG/3ML) 0.083% IN NEBU: INHALATION_SOLUTION | RESPIRATORY_TRACT | Status: DC

## 2012-12-23 MED ORDER — CHLORTHALIDONE 25 MG PO TABS: 12.5000 mg | ORAL_TABLET | Freq: Every day | ORAL | Status: DC
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
## 2012-12-23 NOTE — Patient Instructions (Signed)

## 2012-12-31 ENCOUNTER — Encounter: Payer: Self-pay | Admitting: Cardiology

## 2012-12-31 ENCOUNTER — Ambulatory Visit (INDEPENDENT_AMBULATORY_CARE_PROVIDER_SITE_OTHER): Payer: Medicare Other | Admitting: Cardiology

## 2012-12-31 VITALS — BP 124/84 | HR 76 | Ht 68.0 in | Wt 186.0 lb

## 2012-12-31 DIAGNOSIS — I6529 Occlusion and stenosis of unspecified carotid artery: Secondary | ICD-10-CM

## 2012-12-31 DIAGNOSIS — I059 Rheumatic mitral valve disease, unspecified: Secondary | ICD-10-CM

## 2012-12-31 DIAGNOSIS — I10 Essential (primary) hypertension: Secondary | ICD-10-CM

## 2012-12-31 NOTE — Assessment & Plan Note (Signed)
Blood pressure is normal today. 

## 2012-12-31 NOTE — Assessment & Plan Note (Signed)
50% RICA stenosis 2011. He continues on aspirin and Zetia.

## 2012-12-31 NOTE — Progress Notes (Signed)
Clinical Summary Jared Cruz is a 79 y.o.male last seen in July 2013. He continues to do well, reports NYHA class II dyspnea which is stable, no palpitations, dizziness, or syncope.  Echocardiogram from August 2013 demonstrated mild LVH with LVEF 0000000, grade 1 diastolic dysfunction, mild aortic regurgitation, moderately thickened mitral leaflets with moderate prolapse of the anterior leaflet and moderate mitral regurgitation (however regurgitant volume calculated at only 17 mL), moderate tricuspid regurgitation.  Recent lab work noted, potassium 4.2, BUN 13, creatinine 1.1, AST 13, ALT 10, LDL 114, cholesterol 195, HDL 46, triglycerides 177.  Allergies  Allergen Reactions  . Ace Inhibitors   . Zocor (Simvastatin)     Current Outpatient Prescriptions  Medication Sig Dispense Refill  . albuterol (PROVENTIL) (2.5 MG/3ML) 0.083% nebulizer solution NEBULIZE 1 VIAL TWICE A DAY  150 mL  2  . albuterol (PROVENTIL) 90 MCG/ACT inhaler Inhale 2 puffs into the lungs 2 (two) times daily.       Marland Kitchen amLODipine (NORVASC) 10 MG tablet Take 10 mg by mouth daily.        Marland Kitchen aspirin 81 MG EC tablet Take 81 mg by mouth daily.        . beclomethasone (QVAR) 40 MCG/ACT inhaler Inhale 2 puffs into the lungs 2 (two) times daily.      . chlorthalidone (HYGROTON) 25 MG tablet Take 0.5 tablets (12.5 mg total) by mouth daily.  30 tablet  5  . cloNIDine (CATAPRES) 0.1 MG tablet Take 1 tablet (0.1 mg total) by mouth 3 (three) times daily.  90 tablet  3  . cyclobenzaprine (FLEXERIL) 5 MG tablet Take 1 tablet (5 mg total) by mouth 3 (three) times daily as needed for muscle spasms.  30 tablet  0  . EPINEPHrine (EPIPEN JR) 0.15 MG/0.3ML injection Inject 0.15 mg into the muscle as needed.        . ezetimibe (ZETIA) 10 MG tablet Take 1 tablet (10 mg total) by mouth daily.  30 tablet  11  . fenofibrate (TRICOR) 48 MG tablet Take 1 tablet (48 mg total) by mouth daily.  30 tablet  3  . montelukast (SINGULAIR) 10 MG tablet Take  10 mg by mouth daily.       . Pitavastatin Calcium (LIVALO) 4 MG TABS Take 1 tablet (4 mg total) by mouth daily.  90 tablet  0   No current facility-administered medications for this visit.    Past Medical History  Diagnosis Date  . Asthma     PFT 9/11 - FEC 2.1, 70% predicted; FEV1 1.9, 74% predicted; Normal expiratory loop; DLCO 16.9, 89% predicted   . Dyslipidemia   . Carotid artery disease   . Mitral valve prolapse     Mild to moderate regurgitation  . Essential hypertension, benign   . Hypertension     Social History Jared Cruz reports that he has never smoked. He has never used smokeless tobacco. Jared Cruz reports that he does not drink alcohol.  Review of Systems Recently strained his back while lifting something. Otherwise negative.  Physical Examination Filed Vitals:   12/31/12 0911  BP: 124/84  Pulse: 76   Filed Weights   12/31/12 0911  Weight: 186 lb (84.369 kg)    Overweight male in no acute distress.  HEENT: Conjunctiva and lids normal, oropharynx clear with moist mucosa.  Neck: Supple, no elevated JVP, no thyromegaly.  Lungs: Clear to auscultation, nonlabored breathing at rest.  Cardiac: Regular rate and rhythm, no S3 with 2/6 systolic  murmur c/w MR at apex, soft click, no pericardial rub.  Abdomen: Soft, nontender, bowel sounds present, no guarding or rebound.  Extremities: No pitting edema, distal pulses 2+.  Skin: Warm and dry.  Musculoskeletal: No kyphosis.  Neuropsychiatric: Alert and oriented x3, affect grossly appropriate.   Problem List and Plan   MITRAL VALVE PROLAPSE Symptomatically stable, no significant change in examination. Echocardiogram from this past August was reviewed. For now we will continue observation, one year followup visit with repeat echocardiogram. If symptoms develop in the interim, we can reassess him sooner.  CAROTID ARTERY DISEASE 50% RICA stenosis 2011. He continues on aspirin and Zetia.  Essential hypertension,  benign Blood pressure is normal today.    Satira Sark, M.D., F.A.C.C.

## 2012-12-31 NOTE — Assessment & Plan Note (Signed)
Symptomatically stable, no significant change in examination. Echocardiogram from this past August was reviewed. For now we will continue observation, one year followup visit with repeat echocardiogram. If symptoms develop in the interim, we can reassess him sooner.

## 2012-12-31 NOTE — Patient Instructions (Addendum)
Your physician recommends that you schedule a follow-up appointment in: 1 year. You will receive a reminder letter in the mail in about 10 months reminding you to call and schedule your appointment. If you don't receive this letter, please contact our office. Your physician recommends that you continue on your current medications as directed. Please refer to the Current Medication list given to you today. Your physician has requested that you have an echocardiogram in 1 year just before your next appointment. Echocardiography is a painless test that uses sound waves to create images of your heart. It provides your doctor with information about the size and shape of your heart and how well your heart's chambers and valves are working. This procedure takes approximately one hour. There are no restrictions for this procedure. You will be contacted about this when its due.

## 2013-01-03 ENCOUNTER — Telehealth: Payer: Self-pay | Admitting: Family Medicine

## 2013-01-03 DIAGNOSIS — E785 Hyperlipidemia, unspecified: Secondary | ICD-10-CM

## 2013-01-03 MED ORDER — PITAVASTATIN CALCIUM 4 MG PO TABS: 1.0000 | ORAL_TABLET | Freq: Every day | ORAL | Status: DC

## 2013-01-03 NOTE — Telephone Encounter (Signed)
Spoke with pt --samples livalo 4mg  at front desk

## 2013-01-21 ENCOUNTER — Ambulatory Visit (INDEPENDENT_AMBULATORY_CARE_PROVIDER_SITE_OTHER): Payer: Medicare Other | Admitting: Family Medicine

## 2013-01-21 ENCOUNTER — Encounter: Payer: Self-pay | Admitting: Family Medicine

## 2013-01-21 VITALS — BP 121/78 | HR 78 | Temp 97.9°F | Wt 185.2 lb

## 2013-01-21 DIAGNOSIS — M549 Dorsalgia, unspecified: Secondary | ICD-10-CM | POA: Insufficient documentation

## 2013-01-21 DIAGNOSIS — I10 Essential (primary) hypertension: Secondary | ICD-10-CM | POA: Insufficient documentation
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
## 2013-01-22 ENCOUNTER — Other Ambulatory Visit: Payer: Self-pay | Admitting: Family Medicine

## 2013-01-22 DIAGNOSIS — R899 Unspecified abnormal finding in specimens from other organs, systems and tissues: Secondary | ICD-10-CM

## 2013-01-22 LAB — BMP8+EGFR
BUN/Creatinine Ratio: 14 (ref 10–22)
BUN: 25 mg/dL (ref 8–27)
CO2: 27 mmol/L (ref 18–29)
Calcium: 10.3 mg/dL — ABNORMAL HIGH (ref 8.6–10.2)
Chloride: 103 mmol/L (ref 97–108)
Creatinine, Ser: 1.82 mg/dL — ABNORMAL HIGH (ref 0.76–1.27)
GFR calc Af Amer: 41 mL/min/{1.73_m2} — ABNORMAL LOW (ref >59)
GFR calc non Af Amer: 35 mL/min/{1.73_m2} — ABNORMAL LOW (ref >59)
Glucose: 86 mg/dL (ref 65–99)
Potassium: 5.5 mmol/L — ABNORMAL HIGH (ref 3.5–5.2)
Sodium: 141 mmol/L (ref 134–144)

## 2013-01-22 NOTE — Progress Notes (Signed)
Quick Note:  Labs abnormal. Potassium elevated and he creatinine went up. Patient to drink adequate water / fluids 6 to 8 cups a day. Avoid potassium foods like bananas and citrus. Recheck BMP in 1 week. Ordered in EPIC. ______

## 2013-01-30 ENCOUNTER — Other Ambulatory Visit (INDEPENDENT_AMBULATORY_CARE_PROVIDER_SITE_OTHER): Payer: Medicare Other

## 2013-01-30 DIAGNOSIS — R6889 Other general symptoms and signs: Secondary | ICD-10-CM

## 2013-01-30 DIAGNOSIS — R899 Unspecified abnormal finding in specimens from other organs, systems and tissues: Secondary | ICD-10-CM

## 2013-01-31 ENCOUNTER — Other Ambulatory Visit: Payer: Self-pay | Admitting: Family Medicine

## 2013-01-31 ENCOUNTER — Telehealth: Payer: Self-pay | Admitting: Family Medicine

## 2013-01-31 DIAGNOSIS — N189 Chronic kidney disease, unspecified: Secondary | ICD-10-CM

## 2013-01-31 LAB — BMP8+EGFR
BUN/Creatinine Ratio: 17 (ref 10–22)
BUN: 33 mg/dL — ABNORMAL HIGH (ref 8–27)
CO2: 25 mmol/L (ref 18–29)
Calcium: 9.8 mg/dL (ref 8.6–10.2)
Chloride: 100 mmol/L (ref 97–108)
Creatinine, Ser: 1.94 mg/dL — ABNORMAL HIGH (ref 0.76–1.27)
GFR calc Af Amer: 38 mL/min/{1.73_m2} — ABNORMAL LOW (ref >59)
GFR calc non Af Amer: 33 mL/min/{1.73_m2} — ABNORMAL LOW (ref >59)
Glucose: 92 mg/dL (ref 65–99)
Potassium: 4.7 mmol/L (ref 3.5–5.2)
Sodium: 139 mmol/L (ref 134–144)

## 2013-01-31 NOTE — Telephone Encounter (Signed)
error 

## 2013-01-31 NOTE — Progress Notes (Signed)
Quick Note:  Labs abnormal. Kidney functions more abnormal. Recommend seeing a kidney specialist. ______

## 2013-02-03 ENCOUNTER — Other Ambulatory Visit: Payer: Self-pay

## 2013-02-03 DIAGNOSIS — R944 Abnormal results of kidney function studies: Secondary | ICD-10-CM

## 2013-02-12 NOTE — Progress Notes (Signed)
Patient came in for labs only.

## 2013-02-17 ENCOUNTER — Telehealth: Payer: Self-pay | Admitting: Family Medicine

## 2013-02-17 ENCOUNTER — Other Ambulatory Visit: Payer: Self-pay | Admitting: Nephrology

## 2013-02-17 NOTE — Telephone Encounter (Signed)
Spoke with pt and he does not need BS machine he needs a BP machine today

## 2013-02-17 NOTE — Telephone Encounter (Signed)
Rx written and given to [place].

## 2013-02-18 ENCOUNTER — Ambulatory Visit
Admission: RE | Admit: 2013-02-18 | Discharge: 2013-02-18 | Disposition: A | Discharge status: Home / Self Care | Payer: Medicare Other | Source: Ambulatory Visit | Attending: Nephrology | Admitting: Nephrology

## 2013-03-20 ENCOUNTER — Ambulatory Visit (INDEPENDENT_AMBULATORY_CARE_PROVIDER_SITE_OTHER): Payer: Medicare Other

## 2013-03-20 DIAGNOSIS — Z23 Encounter for immunization: Secondary | ICD-10-CM

## 2013-03-25 ENCOUNTER — Ambulatory Visit (INDEPENDENT_AMBULATORY_CARE_PROVIDER_SITE_OTHER): Payer: Medicare Other | Admitting: Family Medicine

## 2013-03-25 ENCOUNTER — Encounter: Payer: Self-pay | Admitting: Family Medicine

## 2013-03-25 ENCOUNTER — Encounter (INDEPENDENT_AMBULATORY_CARE_PROVIDER_SITE_OTHER): Payer: Self-pay

## 2013-03-25 VITALS — BP 147/80 | HR 86 | Temp 97.6°F | Ht 68.0 in | Wt 188.0 lb

## 2013-03-25 DIAGNOSIS — N184 Chronic kidney disease, stage 4 (severe): Secondary | ICD-10-CM

## 2013-03-25 DIAGNOSIS — I059 Rheumatic mitral valve disease, unspecified: Secondary | ICD-10-CM

## 2013-03-25 DIAGNOSIS — I10 Essential (primary) hypertension: Secondary | ICD-10-CM

## 2013-03-25 DIAGNOSIS — I4510 Unspecified right bundle-branch block: Secondary | ICD-10-CM

## 2013-03-25 DIAGNOSIS — J45909 Unspecified asthma, uncomplicated: Secondary | ICD-10-CM

## 2013-03-25 DIAGNOSIS — E785 Hyperlipidemia, unspecified: Secondary | ICD-10-CM

## 2013-03-25 DIAGNOSIS — M549 Dorsalgia, unspecified: Secondary | ICD-10-CM

## 2013-03-25 DIAGNOSIS — I6529 Occlusion and stenosis of unspecified carotid artery: Secondary | ICD-10-CM

## 2013-03-25 DIAGNOSIS — I739 Peripheral vascular disease, unspecified: Secondary | ICD-10-CM

## 2013-03-25 NOTE — Patient Instructions (Signed)
      Dr Unique Sillas's Recommendations  For nutrition information, I recommend books:  1).Eat to Live by Dr Joel Fuhrman. 2).Prevent and Reverse Heart Disease by Dr Caldwell Esselstyn. 3) Dr Neal Barnard's Book:  Program to Reverse Diabetes  Exercise recommendations are:  If unable to walk, then the patient can exercise in a chair 3 times a day. By flapping arms like a bird gently and raising legs outwards to the front.  If ambulatory, the patient can go for walks for 30 minutes 3 times a week. Then increase the intensity and duration as tolerated.  Goal is to try to attain exercise frequency to 5 times a week.  If applicable: Best to perform resistance exercises (machines or weights) 2 days a week and cardio type exercises 3 days per week.  
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
## 2013-03-26 LAB — NMR, LIPOPROFILE
Cholesterol: 162 mg/dL (ref <200)
HDL Cholesterol by NMR: 45 mg/dL (ref >=40)
HDL Particle Number: 32 umol/L (ref >=30.5)
LDL Particle Number: 1372 nmol/L — ABNORMAL HIGH (ref <1000)
LDL Size: 20.4 nm — ABNORMAL LOW (ref >20.5)
LDLC SERPL CALC-MCNC: 85 mg/dL (ref <100)
LP-IR Score: 76 — ABNORMAL HIGH (ref <=45)
Small LDL Particle Number: 845 nmol/L — ABNORMAL HIGH (ref <=527)
Triglycerides by NMR: 161 mg/dL — ABNORMAL HIGH (ref <150)

## 2013-03-26 LAB — CMP14+EGFR
ALT: 10 IU/L (ref 0–44)
AST: 15 IU/L (ref 0–40)
Albumin/Globulin Ratio: 1.6 (ref 1.1–2.5)
Albumin: 4.2 g/dL (ref 3.5–4.8)
Alkaline Phosphatase: 44 IU/L (ref 39–117)
BUN/Creatinine Ratio: 10 (ref 10–22)
BUN: 14 mg/dL (ref 8–27)
CO2: 26 mmol/L (ref 18–29)
Calcium: 9.6 mg/dL (ref 8.6–10.2)
Chloride: 104 mmol/L (ref 97–108)
Creatinine, Ser: 1.34 mg/dL — ABNORMAL HIGH (ref 0.76–1.27)
GFR calc Af Amer: 59 mL/min/{1.73_m2} — ABNORMAL LOW (ref >59)
GFR calc non Af Amer: 51 mL/min/{1.73_m2} — ABNORMAL LOW (ref >59)
Globulin, Total: 2.7 g/dL (ref 1.5–4.5)
Glucose: 93 mg/dL (ref 65–99)
Potassium: 4.6 mmol/L (ref 3.5–5.2)
Sodium: 145 mmol/L — ABNORMAL HIGH (ref 134–144)
Total Bilirubin: 0.3 mg/dL (ref 0.0–1.2)
Total Protein: 6.9 g/dL (ref 6.0–8.5)

## 2013-07-17 ENCOUNTER — Other Ambulatory Visit: Payer: Self-pay | Admitting: Family Medicine

## 2013-07-25 ENCOUNTER — Ambulatory Visit: Payer: Medicare Other | Admitting: Family Medicine

## 2013-08-22 ENCOUNTER — Ambulatory Visit (INDEPENDENT_AMBULATORY_CARE_PROVIDER_SITE_OTHER): Payer: Medicare Other | Admitting: Family Medicine

## 2013-08-22 ENCOUNTER — Encounter: Payer: Self-pay | Admitting: Family Medicine

## 2013-08-22 ENCOUNTER — Other Ambulatory Visit: Payer: Self-pay | Admitting: Family Medicine

## 2013-08-22 VITALS — BP 120/73 | HR 71 | Temp 97.5°F | Ht 68.0 in | Wt 189.6 lb

## 2013-08-22 DIAGNOSIS — J45909 Unspecified asthma, uncomplicated: Secondary | ICD-10-CM

## 2013-08-22 DIAGNOSIS — I10 Essential (primary) hypertension: Secondary | ICD-10-CM

## 2013-08-22 DIAGNOSIS — I059 Rheumatic mitral valve disease, unspecified: Secondary | ICD-10-CM

## 2013-08-22 DIAGNOSIS — N184 Chronic kidney disease, stage 4 (severe): Secondary | ICD-10-CM

## 2013-08-22 DIAGNOSIS — E785 Hyperlipidemia, unspecified: Secondary | ICD-10-CM

## 2013-08-22 MED ORDER — MONTELUKAST SODIUM 10 MG PO TABS: 10.0000 mg | ORAL_TABLET | Freq: Every day | ORAL | Status: DC

## 2013-08-22 MED ORDER — ALBUTEROL SULFATE HFA 108 (90 BASE) MCG/ACT IN AERS: 2.0000 | INHALATION_SPRAY | Freq: Four times a day (QID) | RESPIRATORY_TRACT | Status: DC | PRN
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
## 2013-08-24 LAB — CMP14+EGFR
ALT: 11 IU/L (ref 0–44)
AST: 13 IU/L (ref 0–40)
Albumin/Globulin Ratio: 1.5 (ref 1.1–2.5)
Albumin: 4.4 g/dL (ref 3.5–4.8)
Alkaline Phosphatase: 48 IU/L (ref 39–117)
BUN/Creatinine Ratio: 14 (ref 10–22)
BUN: 21 mg/dL (ref 8–27)
CO2: 25 mmol/L (ref 18–29)
Calcium: 9.7 mg/dL (ref 8.6–10.2)
Chloride: 103 mmol/L (ref 97–108)
Creatinine, Ser: 1.52 mg/dL — ABNORMAL HIGH (ref 0.76–1.27)
GFR calc Af Amer: 50 mL/min/{1.73_m2} — ABNORMAL LOW (ref >59)
GFR calc non Af Amer: 44 mL/min/{1.73_m2} — ABNORMAL LOW (ref >59)
Globulin, Total: 2.9 g/dL (ref 1.5–4.5)
Glucose: 86 mg/dL (ref 65–99)
Potassium: 5 mmol/L (ref 3.5–5.2)
Sodium: 143 mmol/L (ref 134–144)
Total Bilirubin: 0.3 mg/dL (ref 0.0–1.2)
Total Protein: 7.3 g/dL (ref 6.0–8.5)

## 2013-08-24 LAB — NMR, LIPOPROFILE
Cholesterol: 171 mg/dL (ref <200)
HDL Cholesterol by NMR: 47 mg/dL (ref >=40)
HDL Particle Number: 32.5 umol/L (ref >=30.5)
LDL Particle Number: 1172 nmol/L — ABNORMAL HIGH (ref <1000)
LDL Size: 20.7 nm (ref >20.5)
LDLC SERPL CALC-MCNC: 94 mg/dL (ref <100)
LP-IR Score: 51 — ABNORMAL HIGH (ref <=45)
Small LDL Particle Number: 507 nmol/L (ref <=527)
Triglycerides by NMR: 148 mg/dL (ref <150)

## 2013-08-25 ENCOUNTER — Telehealth: Payer: Self-pay | Admitting: Family Medicine

## 2013-08-25 NOTE — Telephone Encounter (Signed)
Diet discussed 

## 2013-11-25 ENCOUNTER — Encounter: Payer: Self-pay | Admitting: Family

## 2013-11-25 ENCOUNTER — Ambulatory Visit (INDEPENDENT_AMBULATORY_CARE_PROVIDER_SITE_OTHER): Payer: Medicare Other | Admitting: Family

## 2013-11-25 VITALS — BP 127/77 | HR 72 | Temp 97.9°F | Wt 182.8 lb

## 2013-11-25 DIAGNOSIS — J45909 Unspecified asthma, uncomplicated: Secondary | ICD-10-CM

## 2013-11-25 DIAGNOSIS — E785 Hyperlipidemia, unspecified: Secondary | ICD-10-CM

## 2013-11-25 DIAGNOSIS — I10 Essential (primary) hypertension: Secondary | ICD-10-CM

## 2013-11-25 DIAGNOSIS — Z1321 Encounter for screening for nutritional disorder: Secondary | ICD-10-CM

## 2013-11-25 DIAGNOSIS — Z23 Encounter for immunization: Secondary | ICD-10-CM

## 2013-11-25 MED ORDER — CHLORTHALIDONE 25 MG PO TABS: 12.5000 mg | ORAL_TABLET | Freq: Every day | ORAL | Status: DC

## 2013-11-25 MED ORDER — BECLOMETHASONE DIPROPIONATE 40 MCG/ACT IN AERS: 2.0000 | INHALATION_SPRAY | Freq: Two times a day (BID) | RESPIRATORY_TRACT | Status: DC

## 2013-11-25 MED ORDER — CLONIDINE HCL 0.1 MG PO TABS: 0.1000 mg | ORAL_TABLET | Freq: Three times a day (TID) | ORAL | Status: DC

## 2013-11-25 MED ORDER — AMLODIPINE BESYLATE 10 MG PO TABS: ORAL_TABLET | ORAL | Status: DC

## 2013-11-25 MED ORDER — PITAVASTATIN CALCIUM 4 MG PO TABS: 1.0000 | ORAL_TABLET | Freq: Every day | ORAL | Status: DC

## 2013-11-25 MED ORDER — FENOFIBRATE 48 MG PO TABS: ORAL_TABLET | ORAL | Status: DC

## 2013-11-25 NOTE — Addendum Note (Signed)
Addended by: Jamelle Haring on: 11/25/2013 09:23 AM   Modules accepted: Orders

## 2013-11-25 NOTE — Patient Instructions (Signed)
Asthma, Adult Asthma is a recurring condition in which the airways tighten and narrow. Asthma can make it difficult to breathe. It can cause coughing, wheezing, and shortness of breath. Asthma episodes (also called asthma attacks) range from minor to life-threatening. Asthma cannot be cured, but medicines and lifestyle changes can help control it. CAUSES Asthma is believed to be caused by inherited (genetic) and environmental factors, but its exact cause is unknown. Asthma may be triggered by allergens, lung infections, or irritants in the air. Asthma triggers are different for each person. Common triggers include:   Animal dander.  Dust mites.  Cockroaches.  Pollen from trees or grass.  Mold.  Smoke.  Air pollutants such as dust, household cleaners, hair sprays, aerosol sprays, paint fumes, strong chemicals, or strong odors.  Cold air, weather changes, and winds (which increase molds and pollens in the air).  Strong emotional expressions such as crying or laughing hard.  Stress.  Certain medicines (such as aspirin) or types of drugs (such as beta-blockers).  Sulfites in foods and drinks. Foods and drinks that may contain sulfites include dried fruit, potato chips, and sparkling grape juice.  Infections or inflammatory conditions such as the flu, a cold, or an inflammation of the nasal membranes (rhinitis).  Gastroesophageal reflux disease (GERD).  Exercise or strenuous activity. SYMPTOMS Symptoms may occur immediately after asthma is triggered or many hours later. Symptoms include:  Wheezing.  Excessive nighttime or early morning coughing.  Frequent or severe coughing with a common cold.  Chest tightness.  Shortness of breath. DIAGNOSIS  The diagnosis of asthma is made by a review of your medical history and a physical exam. Tests may also be performed. These may include:  Lung function studies. These tests show how much air you breath in and out.  Allergy  tests.  Imaging tests such as X-rays. TREATMENT  Asthma cannot be cured, but it can usually be controlled. Treatment involves identifying and avoiding your asthma triggers. It also involves medicines. There are 2 classes of medicine used for asthma treatment:   Controller medicines. These prevent asthma symptoms from occurring. They are usually taken every day.  Reliever or rescue medicines. These quickly relieve asthma symptoms. They are used as needed and provide short-term relief. Your health care provider will help you create an asthma action plan. An asthma action plan is a written plan for managing and treating your asthma attacks. It includes a list of your asthma triggers and how they may be avoided. It also includes information on when medicines should be taken and when their dosage should be changed. An action plan may also involve the use of a device called a peak flow meter. A peak flow meter measures how well the lungs are working. It helps you monitor your condition. HOME CARE INSTRUCTIONS   Take medicine as directed by your health care provider. Speak with your health care provider if you have questions about how or when to take the medicines.  Use a peak flow meter as directed by your health care provider. Record and keep track of readings.  Understand and use the action plan to help minimize or stop an asthma attack without needing to seek medical care.  Control your home environment in the following ways to help prevent asthma attacks:  Do not smoke. Avoid being exposed to secondhand smoke.  Change your heating and air conditioning filter regularly.  Limit your use of fireplaces and wood stoves.  Get rid of pests (such as roaches and   mice) and their droppings.  Throw away plants if you see mold on them.  Clean your floors and dust regularly. Use unscented cleaning products.  Try to have someone else vacuum for you regularly. Stay out of rooms while they are being  vacuumed and for a short while afterward. If you vacuum, use a dust mask from a hardware store, a double-layered or microfilter vacuum cleaner bag, or a vacuum cleaner with a HEPA filter.  Replace carpet with wood, tile, or vinyl flooring. Carpet can trap dander and dust.  Use allergy-proof pillows, mattress covers, and box spring covers.  Wash bed sheets and blankets every week in hot water and dry them in a dryer.  Use blankets that are made of polyester or cotton.  Clean bathrooms and kitchens with bleach. If possible, have someone repaint the walls in these rooms with mold-resistant paint. Keep out of the rooms that are being cleaned and painted.  Wash hands frequently. SEEK MEDICAL CARE IF:   You have wheezing, shortness of breath, or a cough even if taking medicine to prevent attacks.  The colored mucus you cough up (sputum) is thicker than usual.  Your sputum changes from clear or white to yellow, green, gray, or bloody.  You have any problems that may be related to the medicines you are taking (such as a rash, itching, swelling, or trouble breathing).  You are using a reliever medicine more than 2 3 times per week.  Your peak flow is still at 50 79% of you personal best after following your action plan for 1 hour. SEEK IMMEDIATE MEDICAL CARE IF:   You seem to be getting worse and are unresponsive to treatment during an asthma attack.  You are short of breath even at rest.  You get short of breath when doing very little physical activity.  You have difficulty eating, drinking, or talking due to asthma symptoms.  You develop chest pain.  You develop a fast heartbeat.  You have a bluish color to your lips or fingernails.  You are lightheaded, dizzy, or faint.  Your peak flow is less than 50% of your personal best.  You have a fever or persistent symptoms for more than 2 3 days.  You have a fever and symptoms suddenly get worse. MAKE SURE YOU:   Understand these  instructions.  Will watch your condition.  Will get help right away if you are not doing well or get worse. Document Released: 05/29/2005 Document Revised: 01/29/2013 Document Reviewed: 12/26/2012 [employer] Patient Information 2014 [employer].  

## 2013-11-25 NOTE — Progress Notes (Signed)
Subjective:    Patient ID: Jared Cruz, male    DOB: July 26, 1933, 80 y.o.   MRN: [id number]  Hypertension This is a chronic problem. The current episode started more than 1 year ago. The problem has been resolved since onset. The problem is controlled. Pertinent negatives include no anxiety, blurred vision, headaches, palpitations, peripheral edema or shortness of breath. Risk factors for coronary artery disease include dyslipidemia, male gender and family history. Past treatments include beta blockers and calcium channel blockers. The current treatment provides moderate improvement. There is no history of kidney disease, CAD/MI or a thyroid problem. There is no history of sleep apnea.  Hyperlipidemia This is a chronic problem. The current episode started more than 1 year ago. The problem is controlled. Recent lipid tests were reviewed and are high. He has no history of diabetes or hypothyroidism. Factors aggravating his hyperlipidemia include beta blockers. Pertinent negatives include no leg pain, myalgias or shortness of breath. Current antihyperlipidemic treatment includes statins. The current treatment provides moderate improvement of lipids. Risk factors for coronary artery disease include dyslipidemia, family history, hypertension and male sex.  Asthma There is no chest tightness, shortness of breath or wheezing. This is a chronic problem. The current episode started more than 1 year ago. The problem occurs intermittently. The problem has been resolved. Pertinent negatives include no headaches or myalgias. His symptoms are aggravated by change in weather and pollen. His symptoms are alleviated by steroid inhaler and ipratropium. He reports significant improvement on treatment. His past medical history is significant for asthma. There is no history of COPD.      Review of Systems  HENT: Negative.   Eyes: Negative for blurred vision.  Respiratory: Negative.  Negative for shortness of  breath and wheezing.   Cardiovascular: Negative.  Negative for palpitations.  Genitourinary: Negative.   Musculoskeletal: Negative.  Negative for myalgias.  Neurological: Negative for headaches.  Hematological: Negative.   Psychiatric/Behavioral: Negative.   All other systems reviewed and are negative.      Objective:   Physical Exam  Vitals reviewed. Constitutional: He is oriented to person, place, and time. He appears well-developed and well-nourished. No distress.  HENT:  Head: Normocephalic.  Right Ear: External ear normal.  Left Ear: External ear normal.  Mouth/Throat: Oropharynx is clear and moist.  Eyes: Pupils are equal, round, and reactive to light. Right eye exhibits no discharge. Left eye exhibits no discharge.  Neck: Normal range of motion. Neck supple. No thyromegaly present.  Cardiovascular: Normal rate, regular rhythm, normal heart sounds and intact distal pulses.   No murmur heard. Pulmonary/Chest: Effort normal. No respiratory distress. He has wheezes.  Mild wheezes   Abdominal: Soft. Bowel sounds are normal. He exhibits no distension. There is no tenderness.  Musculoskeletal: Normal range of motion. He exhibits no edema and no tenderness.  Neurological: He is alert and oriented to person, place, and time. He has normal reflexes. No cranial nerve deficit.  Skin: Skin is warm and dry. No rash noted. No erythema.  Psychiatric: He has a normal mood and affect. His behavior is normal. Judgment and thought content normal.    BP 127/77  Pulse 72  Temp(Src) 97.9 F (36.6 C) (Oral)  Wt 182 lb 12.8 oz (82.918 kg)       Assessment & Plan:  1. HYPERTENSION - amLODipine (NORVASC) 10 MG tablet; TAKE 1 TABLET DAILY  Dispense: 30 tablet; Refill: 6 - chlorthalidone (HYGROTON) 25 MG tablet; Take 0.5 tablets (12.5 mg total) by  mouth daily.  Dispense: 30 tablet; Refill: 5 - cloNIDine (CATAPRES) 0.1 MG tablet; Take 1 tablet (0.1 mg total) by mouth 3 (three) times daily.   Dispense: 90 tablet; Refill: 3 - CMP14+EGFR  2. DYSLIPIDEMIA - Lipid panel - Pitavastatin Calcium (LIVALO) 4 MG TABS; Take 1 tablet (4 mg total) by mouth daily.  Dispense: 30 tablet; Refill: 6 - fenofibrate (TRICOR) 48 MG tablet; TAKE 1 TABLET ONCE A DAY  Dispense: 30 tablet; Refill: 6  3. Unspecified asthma - beclomethasone (QVAR) 40 MCG/ACT inhaler; Inhale 2 puffs into the lungs 2 (two) times daily.  Dispense: 1 Inhaler; Refill: 11  4. Encounter for vitamin deficiency screening - Vit D  25 hydroxy (rtn osteoporosis monitoring)  5. Other and unspecified hyperlipidemia - Lipid panel - Pitavastatin Calcium (LIVALO) 4 MG TABS; Take 1 tablet (4 mg total) by mouth daily.  Dispense: 30 tablet; Refill: 6 - fenofibrate (TRICOR) 48 MG tablet; TAKE 1 TABLET ONCE A DAY  Dispense: 30 tablet; Refill: 6   Continue all meds Labs pending Health Maintenance reviewed Diet and exercise encouraged RTO 3months  Evelina Dun, FNP

## 2013-11-26 LAB — CMP14+EGFR
ALT: 13 IU/L (ref 0–44)
AST: 17 IU/L (ref 0–40)
Albumin/Globulin Ratio: 1.6 (ref 1.1–2.5)
Albumin: 4.6 g/dL (ref 3.5–4.8)
Alkaline Phosphatase: 49 IU/L (ref 39–117)
BILIRUBIN TOTAL: 0.3 mg/dL (ref 0.0–1.2)
BUN/Creatinine Ratio: 12 (ref 10–22)
BUN: 21 mg/dL (ref 8–27)
CALCIUM: 10.8 mg/dL — AB (ref 8.6–10.2)
CHLORIDE: 103 mmol/L (ref 97–108)
CO2: 28 mmol/L (ref 18–29)
Creatinine, Ser: 1.76 mg/dL — ABNORMAL HIGH (ref 0.76–1.27)
GFR calc Af Amer: 42 mL/min/{1.73_m2} — ABNORMAL LOW (ref >59)
GFR, EST NON AFRICAN AMERICAN: 36 mL/min/{1.73_m2} — AB (ref >59)
Globulin, Total: 2.9 g/dL (ref 1.5–4.5)
Glucose: 103 mg/dL — ABNORMAL HIGH (ref 65–99)
Potassium: 4.8 mmol/L (ref 3.5–5.2)
SODIUM: 143 mmol/L (ref 134–144)
TOTAL PROTEIN: 7.5 g/dL (ref 6.0–8.5)

## 2013-11-26 LAB — LIPID PANEL
CHOLESTEROL TOTAL: 161 mg/dL (ref 100–199)
Chol/HDL Ratio: 3 ratio units (ref 0.0–5.0)
HDL: 53 mg/dL (ref >39)
LDL Calculated: 89 mg/dL (ref 0–99)
Triglycerides: 97 mg/dL (ref 0–149)
VLDL CHOLESTEROL CAL: 19 mg/dL (ref 5–40)

## 2013-11-26 LAB — VITAMIN D 25 HYDROXY (VIT D DEFICIENCY, FRACTURES): Vit D, 25-Hydroxy: 22.4 ng/mL — ABNORMAL LOW (ref 30.0–100.0)

## 2013-12-02 ENCOUNTER — Other Ambulatory Visit: Payer: Self-pay | Admitting: *Deleted

## 2013-12-02 DIAGNOSIS — E785 Hyperlipidemia, unspecified: Secondary | ICD-10-CM

## 2013-12-02 MED ORDER — EZETIMIBE 10 MG PO TABS: 10.0000 mg | ORAL_TABLET | Freq: Every day | ORAL | Status: DC

## 2014-01-20 ENCOUNTER — Other Ambulatory Visit: Payer: Self-pay | Admitting: *Deleted

## 2014-01-20 DIAGNOSIS — E785 Hyperlipidemia, unspecified: Secondary | ICD-10-CM

## 2014-01-20 NOTE — Telephone Encounter (Signed)
See allergies. Last lipids 6/15.

## 2014-01-21 MED ORDER — PITAVASTATIN CALCIUM 4 MG PO TABS: 1.0000 | ORAL_TABLET | Freq: Every day | ORAL | Status: DC

## 2014-02-19 ENCOUNTER — Other Ambulatory Visit: Payer: Self-pay | Admitting: Family Medicine

## 2014-02-25 ENCOUNTER — Other Ambulatory Visit: Payer: Self-pay

## 2014-02-25 DIAGNOSIS — E785 Hyperlipidemia, unspecified: Secondary | ICD-10-CM

## 2014-02-25 MED ORDER — PITAVASTATIN CALCIUM 4 MG PO TABS: 1.0000 | ORAL_TABLET | Freq: Every day | ORAL | Status: DC

## 2014-02-27 ENCOUNTER — Telehealth: Payer: Self-pay | Admitting: Family

## 2014-03-24 ENCOUNTER — Ambulatory Visit (INDEPENDENT_AMBULATORY_CARE_PROVIDER_SITE_OTHER): Payer: Medicare Other

## 2014-03-24 DIAGNOSIS — Z23 Encounter for immunization: Secondary | ICD-10-CM

## 2014-05-22 ENCOUNTER — Other Ambulatory Visit: Payer: Self-pay | Admitting: Family Medicine

## 2014-05-28 ENCOUNTER — Ambulatory Visit: Payer: Medicare Other | Admitting: Family

## 2014-06-22 ENCOUNTER — Ambulatory Visit (INDEPENDENT_AMBULATORY_CARE_PROVIDER_SITE_OTHER): Payer: Medicare Other | Admitting: Family

## 2014-06-22 ENCOUNTER — Encounter: Payer: Self-pay | Admitting: Family

## 2014-06-22 VITALS — BP 135/76 | HR 82 | Temp 97.0°F | Ht 68.0 in | Wt 189.8 lb

## 2014-06-22 DIAGNOSIS — J4520 Mild intermittent asthma, uncomplicated: Secondary | ICD-10-CM | POA: Diagnosis not present

## 2014-06-22 DIAGNOSIS — M545 Low back pain, unspecified: Secondary | ICD-10-CM

## 2014-06-22 DIAGNOSIS — I10 Essential (primary) hypertension: Secondary | ICD-10-CM | POA: Diagnosis not present

## 2014-06-22 DIAGNOSIS — E785 Hyperlipidemia, unspecified: Secondary | ICD-10-CM

## 2014-06-22 DIAGNOSIS — J45909 Unspecified asthma, uncomplicated: Secondary | ICD-10-CM | POA: Diagnosis not present

## 2014-06-22 MED ORDER — BECLOMETHASONE DIPROPIONATE 40 MCG/ACT IN AERS: 2.0000 | INHALATION_SPRAY | Freq: Two times a day (BID) | RESPIRATORY_TRACT | Status: DC

## 2014-06-22 MED ORDER — FENOFIBRATE 48 MG PO TABS: ORAL_TABLET | ORAL | Status: DC

## 2014-06-22 MED ORDER — MONTELUKAST SODIUM 10 MG PO TABS: 10.0000 mg | ORAL_TABLET | Freq: Every day | ORAL | Status: DC

## 2014-06-22 MED ORDER — CHLORTHALIDONE 25 MG PO TABS: 12.5000 mg | ORAL_TABLET | Freq: Every day | ORAL | Status: DC

## 2014-06-22 MED ORDER — CLONIDINE HCL 0.1 MG PO TABS: 0.1000 mg | ORAL_TABLET | Freq: Three times a day (TID) | ORAL | Status: DC

## 2014-06-22 MED ORDER — AMLODIPINE BESYLATE 10 MG PO TABS: ORAL_TABLET | ORAL | Status: DC

## 2014-06-22 MED ORDER — ALBUTEROL SULFATE HFA 108 (90 BASE) MCG/ACT IN AERS: 2.0000 | INHALATION_SPRAY | Freq: Four times a day (QID) | RESPIRATORY_TRACT | Status: DC | PRN

## 2014-06-22 MED ORDER — PITAVASTATIN CALCIUM 4 MG PO TABS: 1.0000 | ORAL_TABLET | Freq: Every day | ORAL | Status: DC

## 2014-06-22 MED ORDER — EZETIMIBE 10 MG PO TABS: 10.0000 mg | ORAL_TABLET | Freq: Every day | ORAL | Status: DC

## 2014-06-22 MED ORDER — ALBUTEROL SULFATE (2.5 MG/3ML) 0.083% IN NEBU: INHALATION_SOLUTION | RESPIRATORY_TRACT | Status: DC

## 2014-06-22 MED ORDER — CYCLOBENZAPRINE HCL 5 MG PO TABS: 5.0000 mg | ORAL_TABLET | Freq: Three times a day (TID) | ORAL | Status: DC | PRN

## 2014-06-22 NOTE — Progress Notes (Signed)
Subjective:    Patient ID: Jared Cruz, male    DOB: 1934/05/10, 80 y.o.   MRN: [id number]  Asthma There is no chest tightness, shortness of breath or wheezing. This is a chronic problem. The current episode started more than 1 year ago. The problem occurs intermittently. The problem has been resolved. Pertinent negatives include no headaches or myalgias. His symptoms are aggravated by change in weather and pollen. His symptoms are alleviated by steroid inhaler, ipratropium and leukotriene antagonist. He reports significant improvement on treatment. His past medical history is significant for asthma. There is no history of COPD.  Hyperlipidemia This is a chronic problem. The current episode started more than 1 year ago. The problem is controlled. Recent lipid tests were reviewed and are normal. He has no history of diabetes or hypothyroidism. Factors aggravating his hyperlipidemia include beta blockers. Pertinent negatives include no leg pain, myalgias or shortness of breath. Current antihyperlipidemic treatment includes fibric acid derivatives and ezetimibe. The current treatment provides moderate improvement of lipids. Risk factors for coronary artery disease include dyslipidemia, family history, hypertension and male sex.  Hypertension This is a chronic problem. The current episode started more than 1 year ago. The problem has been resolved since onset. The problem is controlled. Pertinent negatives include no anxiety, blurred vision, headaches, palpitations, peripheral edema or shortness of breath. Risk factors for coronary artery disease include dyslipidemia, male gender and family history. Past treatments include calcium channel blockers. The current treatment provides moderate improvement. There is no history of kidney disease, CAD/MI or a thyroid problem. There is no history of sleep apnea.  Back Pain This is a chronic problem. The current episode started more than 1 month ago. The problem  occurs intermittently. The problem has been waxing and waning since onset. The pain is present in the lumbar spine. The quality of the pain is described as aching. The pain is at a severity of 6/10. The pain is moderate. The symptoms are aggravated by lying down. Pertinent negatives include no dysuria, headaches, leg pain, numbness, perianal numbness or tingling. He has tried bed rest for the symptoms. The treatment provided mild relief.      Review of Systems  Constitutional: Negative.   HENT: Negative.   Eyes: Negative for blurred vision.  Respiratory: Negative.  Negative for shortness of breath and wheezing.   Cardiovascular: Negative.  Negative for palpitations.  Gastrointestinal: Negative.   Endocrine: Negative.   Genitourinary: Negative.  Negative for dysuria.  Musculoskeletal: Positive for back pain. Negative for myalgias.  Neurological: Negative.  Negative for tingling, numbness and headaches.  Hematological: Negative.   Psychiatric/Behavioral: Negative.   All other systems reviewed and are negative.      Objective:   Physical Exam  Constitutional: He is oriented to person, place, and time. He appears well-developed and well-nourished. No distress.  HENT:  Head: Normocephalic.  Right Ear: External ear normal.  Left Ear: External ear normal.  Nose: Nose normal.  Mouth/Throat: Oropharynx is clear and moist.  Eyes: Pupils are equal, round, and reactive to light. Right eye exhibits no discharge. Left eye exhibits no discharge.  Neck: Normal range of motion. Neck supple. No thyromegaly present.  Cardiovascular: Normal rate, regular rhythm, normal heart sounds and intact distal pulses.   No murmur heard. Pulmonary/Chest: Effort normal and breath sounds normal. No respiratory distress. He has no wheezes.  Abdominal: Soft. Bowel sounds are normal. He exhibits no distension. There is no tenderness.  Musculoskeletal: Normal range of motion. He  exhibits no edema or tenderness.    Neurological: He is alert and oriented to person, place, and time. He has normal reflexes. No cranial nerve deficit.  Skin: Skin is warm and dry. No rash noted. No erythema.  Psychiatric: He has a normal mood and affect. His behavior is normal. Judgment and thought content normal.  Vitals reviewed.   BP 135/76 mmHg  Pulse 82  Temp(Src) 97 F (36.1 C) (Oral)  Ht _0  (1.727 m)  Wt 189 lb 12.8 oz (86.093 kg)  BMI 28.87 kg/m2       Assessment & Plan:  1. Essential hypertension - cloNIDine (CATAPRES) 0.1 MG tablet; Take 1 tablet (0.1 mg total) by mouth 3 (three) times daily.  Dispense: 90 tablet; Refill: 3 - amLODipine (NORVASC) 10 MG tablet; TAKE 1 TABLET DAILY  Dispense: 90 tablet; Refill: 3 - chlorthalidone (HYGROTON) 25 MG tablet; Take 0.5 tablets (12.5 mg total) by mouth daily.  Dispense: 90 tablet; Refill: 3 - CMP14+EGFR - Vit D  25 hydroxy (rtn osteoporosis monitoring)  2. Asthma, mild intermittent, uncomplicated - ION62+XBMW  3. Hyperlipidemia - Pitavastatin Calcium (LIVALO) 4 MG TABS; Take 1 tablet (4 mg total) by mouth daily.  Dispense: 90 tablet; Refill: 3 - fenofibrate (TRICOR) 48 MG tablet; TAKE 1 TABLET ONCE A DAY  Dispense: 90 tablet; Refill: 3 - ezetimibe (ZETIA) 10 MG tablet; Take 1 tablet (10 mg total) by mouth daily.  Dispense: 90 tablet; Refill: 3 - CMP14+EGFR  4. Asthma, chronic, mild intermittent, uncomplicated - montelukast (SINGULAIR) 10 MG tablet; Take 1 tablet (10 mg total) by mouth daily.  Dispense: 90 tablet; Refill: 3 - albuterol (PROVENTIL HFA;VENTOLIN HFA) 108 (90 BASE) MCG/ACT inhaler; Inhale 2 puffs into the lungs every 6 (six) hours as needed for wheezing or shortness of breath.  Dispense: 1 Inhaler; Refill: 11 - albuterol (PROVENTIL) (2.5 MG/3ML) 0.083% nebulizer solution; NEBULIZE 1 VIAL TWICE A DAY  Dispense: 150 mL; Refill: 2 - beclomethasone (QVAR) 40 MCG/ACT inhaler; Inhale 2 puffs into the lungs 2 (two) times daily.  Dispense: 1 Inhaler;  Refill: 11  5. Midline low back pain without sciatica - cyclobenzaprine (FLEXERIL) 5 MG tablet; Take 1 tablet (5 mg total) by mouth 3 (three) times daily as needed for muscle spasms.  Dispense: 30 tablet; Refill: 1   Continue all meds Labs pending Health Maintenance reviewed Diet and exercise encouraged RTO 6 months  Evelina Dun, FNP

## 2014-06-22 NOTE — Patient Instructions (Signed)
Back Pain, Adult Low back pain is very common. About 1 in 5 people have back pain.The cause of low back pain is rarely dangerous. The pain often gets better over time.About half of people with a sudden onset of back pain feel better in just 2 weeks. About 8 in 10 people feel better by 6 weeks.  CAUSES Some common causes of back pain include:  Strain of the muscles or ligaments supporting the spine.  Wear and tear (degeneration) of the spinal discs.  Arthritis.  Direct injury to the back. DIAGNOSIS Most of the time, the direct cause of low back pain is not known.However, back pain can be treated effectively even when the exact cause of the pain is unknown.Answering your caregiver's questions about your overall health and symptoms is one of the most accurate ways to make sure the cause of your pain is not dangerous. If your caregiver needs more information, he or she may order lab work or imaging tests (X-rays or MRIs).However, even if imaging tests show changes in your back, this usually does not require surgery. HOME CARE INSTRUCTIONS For many people, back pain returns.Since low back pain is rarely dangerous, it is often a condition that people can learn to manageon their own.   Remain active. It is stressful on the back to sit or stand in one place. Do not sit, drive, or stand in one place for more than 30 minutes at a time. Take short walks on level surfaces as soon as pain allows.Try to increase the length of time you walk each day.  Do not stay in bed.Resting more than 1 or 2 days can delay your recovery.  Do not avoid exercise or work.Your body is made to move.It is not dangerous to be active, even though your back may hurt.Your back will likely heal faster if you return to being active before your pain is gone.  Pay attention to your body when you bend and lift. Many people have less discomfortwhen lifting if they bend their knees, keep the load close to their bodies,and  avoid twisting. Often, the most comfortable positions are those that put less stress on your recovering back.  Find a comfortable position to sleep. Use a firm mattress and lie on your side with your knees slightly bent. If you lie on your back, put a pillow under your knees.  Only take over-the-counter or prescription medicines as directed by your caregiver. Over-the-counter medicines to reduce pain and inflammation are often the most helpful.Your caregiver may prescribe muscle relaxant drugs.These medicines help dull your pain so you can more quickly return to your normal activities and healthy exercise.  Put ice on the injured area.  Put ice in a plastic bag.  Place a towel between your skin and the bag.  Leave the ice on for 15-20 minutes, 03-04 times a day for the first 2 to 3 days. After that, ice and heat may be alternated to reduce pain and spasms.  Ask your caregiver about trying back exercises and gentle massage. This may be of some benefit.  Avoid feeling anxious or stressed.Stress increases muscle tension and can worsen back pain.It is important to recognize when you are anxious or stressed and learn ways to manage it.Exercise is a great option. SEEK MEDICAL CARE IF:  You have pain that is not relieved with rest or medicine.  You have pain that does not improve in 1 week.  You have new symptoms.  You are generally not feeling well. SEEK   IMMEDIATE MEDICAL CARE IF:   You have pain that radiates from your back into your legs.  You develop new bowel or bladder control problems.  You have unusual weakness or numbness in your arms or legs.  You develop nausea or vomiting.  You develop abdominal pain.  You feel faint. Document Released: 05/29/2005 Document Revised: 11/28/2011 Document Reviewed: 09/30/2013 [facility] Patient Information 2015 [place], [place]. This information is not intended to replace advice given to you by your health care provider. Make sure you  discuss any questions you have with your health care provider. Health Maintenance A healthy lifestyle and preventative care can promote health and wellness.  Maintain regular health, dental, and eye exams.  Eat a healthy diet. Foods like vegetables, fruits, whole grains, low-fat dairy products, and lean protein foods contain the nutrients you need and are low in calories. Decrease your intake of foods high in solid fats, added sugars, and salt. Get information about a proper diet from your health care provider, if necessary.  Regular physical exercise is one of the most important things you can do for your health. Most adults should get at least 150 minutes of moderate-intensity exercise (any activity that increases your heart rate and causes you to sweat) each week. In addition, most adults need muscle-strengthening exercises on 2 or more days a week.   Maintain a healthy weight. The body mass index (BMI) is a screening tool to identify possible weight problems. It provides an estimate of body fat based on height and weight. Your health care provider can find your BMI and can help you achieve or maintain a healthy weight. For males 20 years and older:  A BMI below 18.5 is considered underweight.  A BMI of 18.5 to 24.9 is normal.  A BMI of 25 to 29.9 is considered overweight.  A BMI of 30 and above is considered obese.  Maintain normal blood lipids and cholesterol by exercising and minimizing your intake of saturated fat. Eat a balanced diet with plenty of fruits and vegetables. Blood tests for lipids and cholesterol should begin at age 9 and be repeated every 5 years. If your lipid or cholesterol levels are high, you are over age 66, or you are at high risk for heart disease, you may need your cholesterol levels checked more frequently.Ongoing high lipid and cholesterol levels should be treated with medicines if diet and exercise are not working.  If you smoke, find out from your health  care provider how to quit. If you do not use tobacco, do not start.  Lung cancer screening is recommended for adults aged 34-80 years who are at high risk for developing lung cancer because of a history of smoking. A yearly low-dose CT scan of the lungs is recommended for people who have at least a 30-pack-year history of smoking and are current smokers or have quit within the past 15 years. A pack year of smoking is smoking an average of 1 pack of cigarettes a day for 1 year (for example, a 30-pack-year history of smoking could mean smoking 1 pack a day for 30 years or 2 packs a day for 15 years). Yearly screening should continue until the smoker has stopped smoking for at least 15 years. Yearly screening should be stopped for people who develop a health problem that would prevent them from having lung cancer treatment.  If you choose to drink alcohol, do not have more than 2 drinks per day. One drink is considered to be 12 oz (  360 mL) of beer, 5 oz (150 mL) of wine, or 1.5 oz (45 mL) of liquor.  Avoid the use of street drugs. Do not share needles with anyone. Ask for help if you need support or instructions about stopping the use of drugs.  High blood pressure causes heart disease and increases the risk of stroke. Blood pressure should be checked at least every 1-2 years. Ongoing high blood pressure should be treated with medicines if weight loss and exercise are not effective.  If you are 56-19 years old, ask your health care provider if you should take aspirin to prevent heart disease.  Diabetes screening involves taking a blood sample to check your fasting blood sugar level. This should be done once every 3 years after age 21 if you are at a normal weight and without risk factors for diabetes. Testing should be considered at a younger age or be carried out more frequently if you are overweight and have at least 1 risk factor for diabetes.  Colorectal cancer can be detected and often prevented.  Most routine colorectal cancer screening begins at the age of 35 and continues through age 31. However, your health care provider may recommend screening at an earlier age if you have risk factors for colon cancer. On a yearly basis, your health care provider may provide home test kits to check for hidden blood in the stool. A small camera at the end of a tube may be used to directly examine the colon (sigmoidoscopy or colonoscopy) to detect the earliest forms of colorectal cancer. Talk to your health care provider about this at age 55 when routine screening begins. A direct exam of the colon should be repeated every 5-10 years through age 20, unless early forms of precancerous polyps or small growths are found.  People who are at an increased risk for hepatitis B should be screened for this virus. You are considered at high risk for hepatitis B if:  You were born in a country where hepatitis B occurs often. Talk with your health care provider about which countries are considered high risk.  Your parents were born in a high-risk country and you have not received a shot to protect against hepatitis B (hepatitis B vaccine).  You have HIV or AIDS.  You use needles to inject street drugs.  You live with, or have sex with, someone who has hepatitis B.  You are a man who has sex with other men (MSM).  You get hemodialysis treatment.  You take certain medicines for conditions like cancer, organ transplantation, and autoimmune conditions.  Hepatitis C blood testing is recommended for all people born from [birth year] through 1963 and any individual with known risk factors for hepatitis C.  Healthy men should no longer receive prostate-specific antigen (PSA) blood tests as part of routine cancer screening. Talk to your health care provider about prostate cancer screening.  Testicular cancer screening is not recommended for adolescents or adult males who have no symptoms. Screening includes self-exam, a health  care provider exam, and other screening tests. Consult with your health care provider about any symptoms you have or any concerns you have about testicular cancer.  Practice safe sex. Use condoms and avoid high-risk sexual practices to reduce the spread of sexually transmitted infections (STIs).  You should be screened for STIs, including gonorrhea and chlamydia if:  You are sexually active and are younger than 24 years.  You are older than 24 years, and your health care provider tells you  that you are at risk for this type of infection.  Your sexual activity has changed since you were last screened, and you are at an increased risk for chlamydia or gonorrhea. Ask your health care provider if you are at risk.  If you are at risk of being infected with HIV, it is recommended that you take a prescription medicine daily to prevent HIV infection. This is called pre-exposure prophylaxis (PrEP). You are considered at risk if:  You are a man who has sex with other men (MSM).  You are a heterosexual man who is sexually active with multiple partners.  You take drugs by injection.  You are sexually active with a partner who has HIV.  Talk with your health care provider about whether you are at high risk of being infected with HIV. If you choose to begin PrEP, you should first be tested for HIV. You should then be tested every 3 months for as long as you are taking PrEP.  Use sunscreen. Apply sunscreen liberally and repeatedly throughout the day. You should seek shade when your shadow is shorter than you. Protect yourself by wearing long sleeves, pants, a wide-brimmed hat, and sunglasses year round whenever you are outdoors.  Tell your health care provider of new moles or changes in moles, especially if there is a change in shape or color. Also, tell your health care provider if a mole is larger than the size of a pencil eraser.  A one-time screening for abdominal aortic aneurysm (AAA) and surgical  repair of large AAAs by ultrasound is recommended for men aged 53-75 years who are current or former smokers.  Stay current with your vaccines (immunizations). Document Released: 11/25/2007 Document Revised: 06/03/2013 Document Reviewed: 10/24/2010 [facility] Patient Information 2015 [place], [place]. This information is not intended to replace advice given to you by your health care provider. Make sure you discuss any questions you have with your health care provider.

## 2014-06-23 LAB — CMP14+EGFR
ALBUMIN: 4.5 g/dL (ref 3.5–4.8)
ALK PHOS: 49 IU/L (ref 39–117)
ALT: 8 IU/L (ref 0–44)
AST: 11 IU/L (ref 0–40)
Albumin/Globulin Ratio: 1.6 (ref 1.1–2.5)
BUN / CREAT RATIO: 10 (ref 10–22)
BUN: 17 mg/dL (ref 8–27)
CHLORIDE: 106 mmol/L (ref 97–108)
CO2: 25 mmol/L (ref 18–29)
Calcium: 9.9 mg/dL (ref 8.6–10.2)
Creatinine, Ser: 1.63 mg/dL — ABNORMAL HIGH (ref 0.76–1.27)
GFR calc Af Amer: 46 mL/min/{1.73_m2} — ABNORMAL LOW (ref >59)
GFR calc non Af Amer: 40 mL/min/{1.73_m2} — ABNORMAL LOW (ref >59)
GLUCOSE: 109 mg/dL — AB (ref 65–99)
Globulin, Total: 2.9 g/dL (ref 1.5–4.5)
Potassium: 4.9 mmol/L (ref 3.5–5.2)
Sodium: 144 mmol/L (ref 134–144)
Total Bilirubin: 0.3 mg/dL (ref 0.0–1.2)
Total Protein: 7.4 g/dL (ref 6.0–8.5)

## 2014-06-23 LAB — VITAMIN D 25 HYDROXY (VIT D DEFICIENCY, FRACTURES): Vit D, 25-Hydroxy: 16.6 ng/mL — ABNORMAL LOW (ref 30.0–100.0)

## 2014-06-24 ENCOUNTER — Other Ambulatory Visit: Payer: Self-pay | Admitting: Family

## 2014-06-24 ENCOUNTER — Telehealth: Payer: Self-pay | Admitting: Family

## 2014-06-24 MED ORDER — VITAMIN D (ERGOCALCIFEROL) 1.25 MG (50000 UNIT) PO CAPS: 50000.0000 [IU] | ORAL_CAPSULE | ORAL | Status: DC

## 2014-06-24 NOTE — Telephone Encounter (Signed)
-----   Message from Sharion Balloon, [place] sent at 06/24/2014  8:42 AM EST ----- Kidney and liver function stable Vit D level low- RX sent to pharmacy

## 2014-07-02 NOTE — Telephone Encounter (Signed)
Patient aware.

## 2014-07-29 ENCOUNTER — Encounter: Payer: Self-pay | Admitting: Cardiology

## 2014-09-22 ENCOUNTER — Ambulatory Visit (INDEPENDENT_AMBULATORY_CARE_PROVIDER_SITE_OTHER): Payer: Medicare Other | Admitting: *Deleted

## 2014-09-22 ENCOUNTER — Encounter: Payer: Self-pay | Admitting: *Deleted

## 2014-09-22 VITALS — BP 133/84 | HR 87 | Resp 20 | Ht 67.5 in | Wt 189.6 lb

## 2014-09-22 DIAGNOSIS — Z0000 Encounter for general adult medical examination without abnormal findings: Secondary | ICD-10-CM

## 2014-09-22 DIAGNOSIS — Z1212 Encounter for screening for malignant neoplasm of rectum: Secondary | ICD-10-CM

## 2014-09-22 NOTE — Patient Instructions (Signed)
Advance Directive Advance directives are the legal documents that allow you to make choices about your health care and medical treatment if you cannot speak for yourself. Advance directives are a way for you to communicate your wishes to family, friends, and health care providers. The specified people can then convey your decisions about end-of-life care to avoid confusion if you should become unable to communicate. Ideally, the process of discussing and writing advance directives should happen over time rather than making decisions all at once. Advance directives can be modified as your situation changes, and you can change your mind at any time, even after you have signed the advance directives. Each state has its own laws regarding advance directives. You may want to check with your health care provider, attorney, or state representative about the law in your state. Below are some examples of advance directives. LIVING WILL A living will is a set of instructions documenting your wishes about medical care when you cannot care for yourself. It is used if you become:  Terminally ill.  Incapacitated.  Unable to communicate.  Unable to make decisions. Items to consider in your living will include:  The use or non-use of life-sustaining equipment, such as dialysis machines and breathing machines (ventilators).  A do not resuscitate (DNR) order, which is the instruction not to use cardiopulmonary resuscitation (CPR) if breathing or heartbeat stops.  Tube feeding.  Withholding of food and fluids.  Comfort (palliative) care when the goal becomes comfort rather than a cure.  Organ and tissue donation. A living will does not give instructions about distribution of your money and property if you should pass away. It is advisable to seek the expert advice of a lawyer in drawing up a will regarding your possessions. Decisions about taxes, beneficiaries, and asset distribution will be legally binding.  This process can relieve your family and friends of any burdens surrounding disputes or questions that may come up about the allocation of your assets. DO NOT RESUSCITATE (DNR) A do not resuscitate (DNR) order is a request to not have CPR in the event that your heart stops beating or you stop breathing. Unless given other instructions, a health care provider will try to help any patient whose heart has stopped or who has stopped breathing.  HEALTH CARE PROXY AND DURABLE POWER OF ATTORNEY FOR HEALTH CARE A health care proxy is a person (agent) appointed to make medical decisions for you if you cannot. Generally, people choose someone they know well and trust to represent their preferences when they can no longer do so. You should be sure to ask this person for agreement to act as your agent. An agent may have to exercise judgment in the event of a medical decision for which your wishes are not known. The durable power of attorney for health care is the legal document that names your health care proxy. Once written, it should be:  Signed.  Notarized.  Dated.  Copied.  Witnessed.  Incorporated into your medical record. You may also want to appoint someone to manage your financial affairs if you cannot. This is called a durable power of attorney for finances. It is a separate legal document from the durable power of attorney for health care. You may choose the same person or someone different from your health care proxy to act as your agent in financial matters. Document Released: 09/05/2007 Document Revised: 06/03/2013 Document Reviewed: 10/16/2012 Exit[employer] Patient Information 2015 [employer]. This information is not intended to replace advice   given to you by your health care provider. Make sure you discuss any questions you have with your health care provider. DASH Eating Plan DASH stands for "Dietary Approaches to Stop Hypertension." The DASH eating plan is a healthy eating plan that has  been shown to reduce high blood pressure (hypertension). Additional health benefits may include reducing the risk of type 2 diabetes mellitus, heart disease, and stroke. The DASH eating plan may also help with weight loss. WHAT DO I NEED TO KNOW ABOUT THE DASH EATING PLAN? For the DASH eating plan, you will follow these general guidelines:  Choose foods with a percent daily value for sodium of less than 5% (as listed on the food label).  Use salt-free seasonings or herbs instead of table salt or sea salt.  Check with your health care provider or pharmacist before using salt substitutes.  Eat lower-sodium products, often labeled as "lower sodium" or "no salt added."  Eat fresh foods.  Eat more vegetables, fruits, and low-fat dairy products.  Choose whole grains. Look for the word "whole" as the first word in the ingredient list.  Choose fish and skinless chicken or turkey more often than red meat. Limit fish, poultry, and meat to 6 oz (170 g) each day.  Limit sweets, desserts, sugars, and sugary drinks.  Choose heart-healthy fats.  Limit cheese to 1 oz (28 g) per day.  Eat more home-cooked food and less restaurant, buffet, and fast food.  Limit fried foods.  Cook foods using methods other than frying.  Limit canned vegetables. If you do use them, rinse them well to decrease the sodium.  When eating at a restaurant, ask that your food be prepared with less salt, or no salt if possible. WHAT FOODS CAN I EAT? Seek help from a dietitian for individual calorie needs. Grains Whole grain or whole wheat bread. Brown rice. Whole grain or whole wheat pasta. Quinoa, bulgur, and whole grain cereals. Low-sodium cereals. Corn or whole wheat flour tortillas. Whole grain cornbread. Whole grain crackers. Low-sodium crackers. Vegetables Fresh or frozen vegetables (raw, steamed, roasted, or grilled). Low-sodium or reduced-sodium tomato and vegetable juices. Low-sodium or reduced-sodium tomato  sauce and paste. Low-sodium or reduced-sodium canned vegetables.  Fruits All fresh, canned (in natural juice), or frozen fruits. Meat and Other Protein Products Ground beef (85% or leaner), grass-fed beef, or beef trimmed of fat. Skinless chicken or turkey. Ground chicken or turkey. Pork trimmed of fat. All fish and seafood. Eggs. Dried beans, peas, or lentils. Unsalted nuts and seeds. Unsalted canned beans. Dairy Low-fat dairy products, such as skim or 1% milk, 2% or reduced-fat cheeses, low-fat ricotta or cottage cheese, or plain low-fat yogurt. Low-sodium or reduced-sodium cheeses. Fats and Oils Tub margarines without trans fats. Light or reduced-fat mayonnaise and salad dressings (reduced sodium). Avocado. Safflower, olive, or canola oils. Natural peanut or almond butter. Other Unsalted popcorn and pretzels. The items listed above may not be a complete list of recommended foods or beverages. Contact your dietitian for more options. WHAT FOODS ARE NOT RECOMMENDED? Grains White bread. White pasta. White rice. Refined cornbread. Bagels and croissants. Crackers that contain trans fat. Vegetables Creamed or fried vegetables. Vegetables in a cheese sauce. Regular canned vegetables. Regular canned tomato sauce and paste. Regular tomato and vegetable juices. Fruits Dried fruits. Canned fruit in light or heavy syrup. Fruit juice. Meat and Other Protein Products Fatty cuts of meat. Ribs, chicken wings, bacon, sausage, bologna, salami, chitterlings, fatback, hot dogs, bratwurst, and packaged luncheon meats. Salted   nuts and seeds. Canned beans with salt. Dairy Whole or 2% milk, cream, half-and-half, and cream cheese. Whole-fat or sweetened yogurt. Full-fat cheeses or blue cheese. Nondairy creamers and whipped toppings. Processed cheese, cheese spreads, or cheese curds. Condiments Onion and garlic salt, seasoned salt, table salt, and sea salt. Canned and packaged gravies. Worcestershire sauce. Tartar  sauce. Barbecue sauce. Teriyaki sauce. Soy sauce, including reduced sodium. Steak sauce. Fish sauce. Oyster sauce. Cocktail sauce. Horseradish. Ketchup and mustard. Meat flavorings and tenderizers. Bouillon cubes. Hot sauce. Tabasco sauce. Marinades. Taco seasonings. Relishes. Fats and Oils Butter, stick margarine, lard, shortening, ghee, and bacon fat. Coconut, palm kernel, or palm oils. Regular salad dressings. Other Pickles and olives. Salted popcorn and pretzels. The items listed above may not be a complete list of foods and beverages to avoid. Contact your dietitian for more information. WHERE CAN I FIND MORE INFORMATION? National [employer], [employer], [employer]: www.nhlbi.nih.gov/health/health-topics/topics/dash/ Document Released: 05/18/2011 Document Revised: 10/13/2013 Document Reviewed: 04/02/2013 Exit[employer] Patient Information 2015 [employer]. This information is not intended to replace advice given to you by your health care provider. Make sure you discuss any questions you have with your health care provider. Preventive Care for Adults A healthy lifestyle and preventive care can promote health and wellness. Preventive health guidelines for men include the following key practices:  A routine yearly physical is a good way to check with your health care provider about your health and preventative screening. It is a chance to share any concerns and updates on your health and to receive a thorough exam.  Visit your dentist for a routine exam and preventative care every 6 months. Brush your teeth twice a day and floss once a day. Good oral hygiene prevents tooth decay and gum disease.  The frequency of eye exams is based on your age, health, family medical history, use of contact lenses, and other factors. Follow your health care provider's recommendations for frequency of eye exams.  Eat a healthy diet. Foods such as vegetables, fruits, whole grains, low-fat dairy products, and lean  protein foods contain the nutrients you need without too many calories. Decrease your intake of foods high in solid fats, added sugars, and salt. Eat the right amount of calories for you.Get information about a proper diet from your health care provider, if necessary.  Regular physical exercise is one of the most important things you can do for your health. Most adults should get at least 150 minutes of moderate-intensity exercise (any activity that increases your heart rate and causes you to sweat) each week. In addition, most adults need muscle-strengthening exercises on 2 or more days a week.  Maintain a healthy weight. The body mass index (BMI) is a screening tool to identify possible weight problems. It provides an estimate of body fat based on height and weight. Your health care provider can find your BMI and can help you achieve or maintain a healthy weight.For adults 20 years and older:  A BMI below 18.5 is considered underweight.  A BMI of 18.5 to 24.9 is normal.  A BMI of 25 to 29.9 is considered overweight.  A BMI of 30 and above is considered obese.  Maintain normal blood lipids and cholesterol levels by exercising and minimizing your intake of saturated fat. Eat a balanced diet with plenty of fruit and vegetables. Blood tests for lipids and cholesterol should begin at age 20 and be repeated every 5 years. If your lipid or cholesterol levels are high, you are over 50,   or you are at high risk for heart disease, you may need your cholesterol levels checked more frequently.Ongoing high lipid and cholesterol levels should be treated with medicines if diet and exercise are not working.  If you smoke, find out from your health care provider how to quit. If you do not use tobacco, do not start.  Lung cancer screening is recommended for adults aged 55-80 years who are at high risk for developing lung cancer because of a history of smoking. A yearly low-dose CT scan of the lungs is  recommended for people who have at least a 30-pack-year history of smoking and are a current smoker or have quit within the past 15 years. A pack year of smoking is smoking an average of 1 pack of cigarettes a day for 1 year (for example: 1 pack a day for 30 years or 2 packs a day for 15 years). Yearly screening should continue until the smoker has stopped smoking for at least 15 years. Yearly screening should be stopped for people who develop a health problem that would prevent them from having lung cancer treatment.  If you choose to drink alcohol, do not have more than 2 drinks per day. One drink is considered to be 12 ounces (355 mL) of beer, 5 ounces (148 mL) of wine, or 1.5 ounces (44 mL) of liquor.  Avoid use of street drugs. Do not share needles with anyone. Ask for help if you need support or instructions about stopping the use of drugs.  High blood pressure causes heart disease and increases the risk of stroke. Your blood pressure should be checked at least every 1-2 years. Ongoing high blood pressure should be treated with medicines, if weight loss and exercise are not effective.  If you are 45-79 years old, ask your health care provider if you should take aspirin to prevent heart disease.  Diabetes screening involves taking a blood sample to check your fasting blood sugar level. This should be done once every 3 years, after age 45, if you are within normal weight and without risk factors for diabetes. Testing should be considered at a younger age or be carried out more frequently if you are overweight and have at least 1 risk factor for diabetes.  Colorectal cancer can be detected and often prevented. Most routine colorectal cancer screening begins at the age of 50 and continues through age 75. However, your health care provider may recommend screening at an earlier age if you have risk factors for colon cancer. On a yearly basis, your health care provider may provide home test kits to check  for hidden blood in the stool. Use of a small camera at the end of a tube to directly examine the colon (sigmoidoscopy or colonoscopy) can detect the earliest forms of colorectal cancer. Talk to your health care provider about this at age 50, when routine screening begins. Direct exam of the colon should be repeated every 5-10 years through age 75, unless early forms of precancerous polyps or small growths are found.  People who are at an increased risk for hepatitis B should be screened for this virus. You are considered at high risk for hepatitis B if:  You were born in a country where hepatitis B occurs often. Talk with your health care provider about which countries are considered high risk.  Your parents were born in a high-risk country and you have not received a shot to protect against hepatitis B (hepatitis B vaccine).  You have HIV   or AIDS.  You use needles to inject street drugs.  You live with, or have sex with, someone who has hepatitis B.  You are a man who has sex with other men (MSM).  You get hemodialysis treatment.  You take certain medicines for conditions such as cancer, organ transplantation, and autoimmune conditions.  Hepatitis C blood testing is recommended for all people born from 1945 through 1965 and any individual with known risks for hepatitis C.  Practice safe sex. Use condoms and avoid high-risk sexual practices to reduce the spread of sexually transmitted infections (STIs). STIs include gonorrhea, chlamydia, syphilis, trichomonas, herpes, HPV, and human immunodeficiency virus (HIV). Herpes, HIV, and HPV are viral illnesses that have no cure. They can result in disability, cancer, and death.  If you are at risk of being infected with HIV, it is recommended that you take a prescription medicine daily to prevent HIV infection. This is called preexposure prophylaxis (PrEP). You are considered at risk if:  You are a man who has sex with other men (MSM) and have  other risk factors.  You are a heterosexual man, are sexually active, and are at increased risk for HIV infection.  You take drugs by injection.  You are sexually active with a partner who has HIV.  Talk with your health care provider about whether you are at high risk of being infected with HIV. If you choose to begin PrEP, you should first be tested for HIV. You should then be tested every 3 months for as long as you are taking PrEP.  A one-time screening for abdominal aortic aneurysm (AAA) and surgical repair of large AAAs by ultrasound are recommended for men ages 65 to 75 years who are current or former smokers.  Healthy men should no longer receive prostate-specific antigen (PSA) blood tests as part of routine cancer screening. Talk with your health care provider about prostate cancer screening.  Testicular cancer screening is not recommended for adult males who have no symptoms. Screening includes self-exam, a health care provider exam, and other screening tests. Consult with your health care provider about any symptoms you have or any concerns you have about testicular cancer.  Use sunscreen. Apply sunscreen liberally and repeatedly throughout the day. You should seek shade when your shadow is shorter than you. Protect yourself by wearing long sleeves, pants, a wide-brimmed hat, and sunglasses year round, whenever you are outdoors.  Once a month, do a whole-body skin exam, using a mirror to look at the skin on your back. Tell your health care provider about new moles, moles that have irregular borders, moles that are larger than a pencil eraser, or moles that have changed in shape or color.  Stay current with required vaccines (immunizations).  Influenza vaccine. All adults should be immunized every year.  Tetanus, diphtheria, and acellular pertussis (Td, Tdap) vaccine. An adult who has not previously received Tdap or who does not know his vaccine status should receive 1 dose of Tdap.  This initial dose should be followed by tetanus and diphtheria toxoids (Td) booster doses every 10 years. Adults with an unknown or incomplete history of completing a 3-dose immunization series with Td-containing vaccines should begin or complete a primary immunization series including a Tdap dose. Adults should receive a Td booster every 10 years.  Varicella vaccine. An adult without evidence of immunity to varicella should receive 2 doses or a second dose if he has previously received 1 dose.  Human papillomavirus (HPV) vaccine. Males aged 13-21   years who have not received the vaccine previously should receive the 3-dose series. Males aged 22-26 years may be immunized. Immunization is recommended through the age of 26 years for any male who has sex with males and did not get any or all doses earlier. Immunization is recommended for any person with an immunocompromised condition through the age of 26 years if he did not get any or all doses earlier. During the 3-dose series, the second dose should be obtained 4-8 weeks after the first dose. The third dose should be obtained 24 weeks after the first dose and 16 weeks after the second dose.  Zoster vaccine. One dose is recommended for adults aged 60 years or older unless certain conditions are present.  Measles, mumps, and rubella (MMR) vaccine. Adults born before 1957 generally are considered immune to measles and mumps. Adults born in 1957 or later should have 1 or more doses of MMR vaccine unless there is a contraindication to the vaccine or there is laboratory evidence of immunity to each of the three diseases. A routine second dose of MMR vaccine should be obtained at least 28 days after the first dose for students attending postsecondary schools, health care workers, or international travelers. People who received inactivated measles vaccine or an unknown type of measles vaccine during 1963-1967 should receive 2 doses of MMR vaccine. People who received  inactivated mumps vaccine or an unknown type of mumps vaccine before 1979 and are at high risk for mumps infection should consider immunization with 2 doses of MMR vaccine. Unvaccinated health care workers born before 1957 who lack laboratory evidence of measles, mumps, or rubella immunity or laboratory confirmation of disease should consider measles and mumps immunization with 2 doses of MMR vaccine or rubella immunization with 1 dose of MMR vaccine.  Pneumococcal 13-valent conjugate (PCV13) vaccine. When indicated, a person who is uncertain of his immunization history and has no record of immunization should receive the PCV13 vaccine. An adult aged 19 years or older who has certain medical conditions and has not been previously immunized should receive 1 dose of PCV13 vaccine. This PCV13 should be followed with a dose of pneumococcal polysaccharide (PPSV23) vaccine. The PPSV23 vaccine dose should be obtained at least 8 weeks after the dose of PCV13 vaccine. An adult aged 19 years or older who has certain medical conditions and previously received 1 or more doses of PPSV23 vaccine should receive 1 dose of PCV13. The PCV13 vaccine dose should be obtained 1 or more years after the last PPSV23 vaccine dose.  Pneumococcal polysaccharide (PPSV23) vaccine. When PCV13 is also indicated, PCV13 should be obtained first. All adults aged 65 years and older should be immunized. An adult younger than age 65 years who has certain medical conditions should be immunized. Any person who resides in a nursing home or long-term care facility should be immunized. An adult smoker should be immunized. People with an immunocompromised condition and certain other conditions should receive both PCV13 and PPSV23 vaccines. People with human immunodeficiency virus (HIV) infection should be immunized as soon as possible after diagnosis. Immunization during chemotherapy or radiation therapy should be avoided. Routine use of PPSV23 vaccine is  not recommended for American Indians, [place] Natives, or people younger than 65 years unless there are medical conditions that require PPSV23 vaccine. When indicated, people who have unknown immunization and have no record of immunization should receive PPSV23 vaccine. One-time revaccination 5 years after the first dose of PPSV23 is recommended for people aged 19-64   years who have chronic kidney failure, nephrotic syndrome, asplenia, or immunocompromised conditions. People who received 1-2 doses of PPSV23 before age 65 years should receive another dose of PPSV23 vaccine at age 65 years or later if at least 5 years have passed since the previous dose. Doses of PPSV23 are not needed for people immunized with PPSV23 at or after age 65 years.  Meningococcal vaccine. Adults with asplenia or persistent complement component deficiencies should receive 2 doses of quadrivalent meningococcal conjugate (MenACWY-D) vaccine. The doses should be obtained at least 2 months apart. Microbiologists working with certain meningococcal bacteria, military recruits, people at risk during an outbreak, and people who travel to or live in countries with a high rate of meningitis should be immunized. A first-year college student up through age 21 years who is living in a residence hall should receive a dose if he did not receive a dose on or after his 16th birthday. Adults who have certain high-risk conditions should receive one or more doses of vaccine.  Hepatitis A vaccine. Adults who wish to be protected from this disease, have certain high-risk conditions, work with hepatitis A-infected animals, work in hepatitis A research labs, or travel to or work in countries with a high rate of hepatitis A should be immunized. Adults who were previously unvaccinated and who anticipate close contact with an international adoptee during the first 60 days after arrival in the [place] from a country with a high rate of hepatitis A should be  immunized.  Hepatitis B vaccine. Adults should be immunized if they wish to be protected from this disease, have certain high-risk conditions, may be exposed to blood or other infectious body fluids, are household contacts or sex partners of hepatitis B positive people, are clients or workers in certain care facilities, or travel to or work in countries with a high rate of hepatitis B.  Haemophilus influenzae type b (Hib) vaccine. A previously unvaccinated person with asplenia or sickle cell disease or having a scheduled splenectomy should receive 1 dose of Hib vaccine. Regardless of previous immunization, a recipient of a hematopoietic stem cell transplant should receive a 3-dose series 6-12 months after his successful transplant. Hib vaccine is not recommended for adults with HIV infection. Preventive Service / Frequency Ages 19 to 39  Blood pressure check.** / Every 1 to 2 years.  Lipid and cholesterol check.** / Every 5 years beginning at age 20.  Hepatitis C blood test.** / For any individual with known risks for hepatitis C.  Skin self-exam. / Monthly.  Influenza vaccine. / Every year.  Tetanus, diphtheria, and acellular pertussis (Tdap, Td) vaccine.** / Consult your health care provider. 1 dose of Td every 10 years.  Varicella vaccine.** / Consult your health care provider.  HPV vaccine. / 3 doses over 6 months, if 26 or younger.  Measles, mumps, rubella (MMR) vaccine.** / You need at least 1 dose of MMR if you were born in 1957 or later. You may also need a second dose.  Pneumococcal 13-valent conjugate (PCV13) vaccine.** / Consult your health care provider.  Pneumococcal polysaccharide (PPSV23) vaccine.** / 1 to 2 doses if you smoke cigarettes or if you have certain conditions.  Meningococcal vaccine.** / 1 dose if you are age 19 to 21 years and a first-year college student living in a residence hall, or have one of several medical conditions. You may also need additional  booster doses.  Hepatitis A vaccine.** / Consult your health care provider.  Hepatitis B vaccine.** /   Consult your health care provider.  Haemophilus influenzae type b (Hib) vaccine.** / Consult your health care provider. Ages 40 to 64  Blood pressure check.** / Every 1 to 2 years.  Lipid and cholesterol check.** / Every 5 years beginning at age 20.  Lung cancer screening. / Every year if you are aged 55-80 years and have a 30-pack-year history of smoking and currently smoke or have quit within the past 15 years. Yearly screening is stopped once you have quit smoking for at least 15 years or develop a health problem that would prevent you from having lung cancer treatment.  Fecal occult blood test (FOBT) of stool. / Every year beginning at age 50 and continuing until age 75. You may not have to do this test if you get a colonoscopy every 10 years.  Flexible sigmoidoscopy** or colonoscopy.** / Every 5 years for a flexible sigmoidoscopy or every 10 years for a colonoscopy beginning at age 50 and continuing until age 75.  Hepatitis C blood test.** / For all people born from 1945 through 1965 and any individual with known risks for hepatitis C.  Skin self-exam. / Monthly.  Influenza vaccine. / Every year.  Tetanus, diphtheria, and acellular pertussis (Tdap/Td) vaccine.** / Consult your health care provider. 1 dose of Td every 10 years.  Varicella vaccine.** / Consult your health care provider.  Zoster vaccine.** / 1 dose for adults aged 60 years or older.  Measles, mumps, rubella (MMR) vaccine.** / You need at least 1 dose of MMR if you were born in 1957 or later. You may also need a second dose.  Pneumococcal 13-valent conjugate (PCV13) vaccine.** / Consult your health care provider.  Pneumococcal polysaccharide (PPSV23) vaccine.** / 1 to 2 doses if you smoke cigarettes or if you have certain conditions.  Meningococcal vaccine.** / Consult your health care provider.  Hepatitis A  vaccine.** / Consult your health care provider.  Hepatitis B vaccine.** / Consult your health care provider.  Haemophilus influenzae type b (Hib) vaccine.** / Consult your health care provider. Ages 65 and over  Blood pressure check.** / Every 1 to 2 years.  Lipid and cholesterol check.**/ Every 5 years beginning at age 20.  Lung cancer screening. / Every year if you are aged 55-80 years and have a 30-pack-year history of smoking and currently smoke or have quit within the past 15 years. Yearly screening is stopped once you have quit smoking for at least 15 years or develop a health problem that would prevent you from having lung cancer treatment.  Fecal occult blood test (FOBT) of stool. / Every year beginning at age 50 and continuing until age 75. You may not have to do this test if you get a colonoscopy every 10 years.  Flexible sigmoidoscopy** or colonoscopy.** / Every 5 years for a flexible sigmoidoscopy or every 10 years for a colonoscopy beginning at age 50 and continuing until age 75.  Hepatitis C blood test.** / For all people born from 1945 through 1965 and any individual with known risks for hepatitis C.  Abdominal aortic aneurysm (AAA) screening.** / A one-time screening for ages 65 to 75 years who are current or former smokers.  Skin self-exam. / Monthly.  Influenza vaccine. / Every year.  Tetanus, diphtheria, and acellular pertussis (Tdap/Td) vaccine.** / 1 dose of Td every 10 years.  Varicella vaccine.** / Consult your health care provider.  Zoster vaccine.** / 1 dose for adults aged 60 years or older.  Pneumococcal 13-valent conjugate (PCV13)   vaccine.** / Consult your health care provider.  Pneumococcal polysaccharide (PPSV23) vaccine.** / 1 dose for all adults aged 65 years and older.  Meningococcal vaccine.** / Consult your health care provider.  Hepatitis A vaccine.** / Consult your health care provider.  Hepatitis B vaccine.** / Consult your health care  provider.  Haemophilus influenzae type b (Hib) vaccine.** / Consult your health care provider. **Family history and personal history of risk and conditions may change your health care provider's recommendations. Document Released: 07/25/2001 Document Revised: 06/03/2013 Document Reviewed: 10/24/2010 ExitCare Patient Information 2015 [employer]. This information is not intended to replace advice given to you by your health care provider. Make sure you discuss any questions you have with your health care provider.  
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
## 2014-10-05 ENCOUNTER — Other Ambulatory Visit: Payer: Medicare Other

## 2014-10-05 DIAGNOSIS — Z1212 Encounter for screening for malignant neoplasm of rectum: Secondary | ICD-10-CM

## 2014-10-05 NOTE — Progress Notes (Signed)
Lab only 

## 2014-10-07 LAB — FECAL OCCULT BLOOD, IMMUNOCHEMICAL: Fecal Occult Bld: NEGATIVE

## 2014-11-23 ENCOUNTER — Encounter: Payer: Self-pay | Admitting: Family

## 2014-11-23 ENCOUNTER — Ambulatory Visit (INDEPENDENT_AMBULATORY_CARE_PROVIDER_SITE_OTHER): Payer: Medicare Other | Admitting: Family

## 2014-11-23 VITALS — BP 119/72 | HR 83 | Temp 97.7°F | Ht 67.5 in | Wt 186.6 lb

## 2014-11-23 DIAGNOSIS — E559 Vitamin D deficiency, unspecified: Secondary | ICD-10-CM | POA: Diagnosis not present

## 2014-11-23 DIAGNOSIS — J4520 Mild intermittent asthma, uncomplicated: Secondary | ICD-10-CM | POA: Diagnosis not present

## 2014-11-23 DIAGNOSIS — M545 Low back pain: Secondary | ICD-10-CM

## 2014-11-23 DIAGNOSIS — I10 Essential (primary) hypertension: Secondary | ICD-10-CM

## 2014-11-23 DIAGNOSIS — E785 Hyperlipidemia, unspecified: Secondary | ICD-10-CM | POA: Diagnosis not present

## 2014-11-23 DIAGNOSIS — Z125 Encounter for screening for malignant neoplasm of prostate: Secondary | ICD-10-CM | POA: Diagnosis not present

## 2014-11-23 NOTE — Progress Notes (Signed)
Subjective:    Patient ID: Jared Cruz, male    DOB: [birth year], 81 y.o.   MRN: [id number]  Hypertension This is a chronic problem. The current episode started more than 1 year ago. The problem has been resolved since onset. The problem is controlled. Pertinent negatives include no anxiety, blurred vision, headaches, palpitations, peripheral edema or shortness of breath. Risk factors for coronary artery disease include dyslipidemia, male gender and family history. Past treatments include calcium channel blockers, direct vasodilators and central alpha agonists. The current treatment provides moderate improvement. Hypertensive end-organ damage includes CAD/MI. There is no history of kidney disease, CVA, heart failure or a thyroid problem. There is no history of sleep apnea.  Hyperlipidemia This is a chronic problem. The current episode started more than 1 year ago. The problem is controlled. Recent lipid tests were reviewed and are normal. He has no history of diabetes or hypothyroidism. Factors aggravating his hyperlipidemia include beta blockers. Pertinent negatives include no leg pain, myalgias or shortness of breath. Current antihyperlipidemic treatment includes fibric acid derivatives and ezetimibe. The current treatment provides moderate improvement of lipids. Risk factors for coronary artery disease include dyslipidemia, family history, hypertension and male sex.  Asthma There is no chest tightness or shortness of breath. Wheezing: "a little at night" This is a chronic problem. The current episode started more than 1 year ago. The problem occurs intermittently. The problem has been resolved. Associated symptoms include sneezing. Pertinent negatives include no ear congestion, headaches or myalgias. His symptoms are aggravated by change in weather and pollen. His symptoms are alleviated by steroid inhaler, ipratropium and leukotriene antagonist. He reports significant improvement on treatment. His  past medical history is significant for asthma. There is no history of COPD.  Back Pain This is a chronic problem. The current episode started more than 1 month ago. The problem occurs intermittently. The problem has been waxing and waning since onset. The pain is present in the lumbar spine. The quality of the pain is described as aching. The pain is at a severity of 6/10. The pain is moderate. The symptoms are aggravated by lying down. Pertinent negatives include no dysuria, headaches, leg pain, numbness, perianal numbness or tingling. He has tried bed rest for the symptoms. The treatment provided mild relief.      Review of Systems  Constitutional: Negative.   HENT: Positive for sneezing.   Eyes: Positive for discharge. Negative for blurred vision.  Respiratory: Negative.  Negative for shortness of breath. Wheezing: "a little at night"   Cardiovascular: Negative.  Negative for palpitations.  Gastrointestinal: Negative.   Endocrine: Negative.   Genitourinary: Negative.  Negative for dysuria.  Musculoskeletal: Positive for back pain. Negative for myalgias.  Neurological: Negative.  Negative for tingling, numbness and headaches.  Hematological: Negative.   Psychiatric/Behavioral: Negative.   All other systems reviewed and are negative.      Objective:   Physical Exam  Constitutional: He is oriented to person, place, and time. He appears well-developed and well-nourished. No distress.  HENT:  Head: Normocephalic.  Right Ear: External ear normal.  Left Ear: External ear normal.  Nose: Nose normal.  Mouth/Throat: Oropharynx is clear and moist.  Eyes: Pupils are equal, round, and reactive to light. Right eye exhibits no discharge. Left eye exhibits no discharge.  Neck: Normal range of motion. Neck supple. No thyromegaly present.  Cardiovascular: Normal rate, regular rhythm, normal heart sounds and intact distal pulses.   No murmur heard. Pulmonary/Chest: Effort normal and breath  sounds normal. No respiratory distress. He has no wheezes.  Abdominal: Soft. Bowel sounds are normal. He exhibits no distension. There is no tenderness.  Musculoskeletal: Normal range of motion. He exhibits no edema or tenderness.  Neurological: He is alert and oriented to person, place, and time. He has normal reflexes. No cranial nerve deficit.  Skin: Skin is warm and dry. No rash noted. No erythema.  Psychiatric: He has a normal mood and affect. His behavior is normal. Judgment and thought content normal.  Vitals reviewed.     BP 119/72 mmHg  Pulse 83  Temp(Src) 97.7 F (36.5 C) (Oral)  Ht 5' 7.5" (1.715 m)  Wt 186 lb 9.6 oz (84.641 kg)  BMI 28.78 kg/m2     Assessment & Plan:  1. Essential hypertension - CMP14+EGFR  2. Hyperlipidemia - CMP14+EGFR - Lipid panel  3. Asthma, mild intermittent, uncomplicated - YEB34+DHWY  4. Vitamin D deficiency - CMP14+EGFR - Vit D  25 hydroxy (rtn osteoporosis monitoring)  5. Prostate cancer screening - CMP14+EGFR - PSA Total+%Free (Serial)  6. Bilateral low back pain, with sciatica presence unspecified    Continue all meds Labs pending Health Maintenance reviewed Diet and exercise encouraged RTO 6 months  Evelina Dun, FNP

## 2014-11-23 NOTE — Patient Instructions (Signed)

## 2014-11-24 LAB — CMP14+EGFR
ALBUMIN: 4.4 g/dL (ref 3.5–4.8)
ALK PHOS: 44 IU/L (ref 39–117)
ALT: 10 IU/L (ref 0–44)
AST: 15 IU/L (ref 0–40)
Albumin/Globulin Ratio: 1.5 (ref 1.1–2.5)
BILIRUBIN TOTAL: 0.4 mg/dL (ref 0.0–1.2)
BUN / CREAT RATIO: 10 (ref 10–22)
BUN: 16 mg/dL (ref 8–27)
CO2: 26 mmol/L (ref 18–29)
Calcium: 9.6 mg/dL (ref 8.6–10.2)
Chloride: 104 mmol/L (ref 97–108)
Creatinine, Ser: 1.67 mg/dL — ABNORMAL HIGH (ref 0.76–1.27)
GFR calc non Af Amer: 39 mL/min/{1.73_m2} — ABNORMAL LOW (ref >59)
GFR, EST AFRICAN AMERICAN: 45 mL/min/{1.73_m2} — AB (ref >59)
Globulin, Total: 3 g/dL (ref 1.5–4.5)
Glucose: 101 mg/dL — ABNORMAL HIGH (ref 65–99)
POTASSIUM: 4.9 mmol/L (ref 3.5–5.2)
Sodium: 143 mmol/L (ref 134–144)
Total Protein: 7.4 g/dL (ref 6.0–8.5)

## 2014-11-24 LAB — LIPID PANEL
Chol/HDL Ratio: 3.4 ratio units (ref 0.0–5.0)
Cholesterol, Total: 179 mg/dL (ref 100–199)
HDL: 53 mg/dL (ref >39)
LDL Calculated: 106 mg/dL — ABNORMAL HIGH (ref 0–99)
Triglycerides: 100 mg/dL (ref 0–149)
VLDL Cholesterol Cal: 20 mg/dL (ref 5–40)

## 2014-11-24 LAB — VITAMIN D 25 HYDROXY (VIT D DEFICIENCY, FRACTURES): VIT D 25 HYDROXY: 49.7 ng/mL (ref 30.0–100.0)

## 2014-11-24 LAB — PSA TOTAL+% FREE (SERIAL)
PSA FREE: 0.62 ng/mL
PSA, Free Pct: 51.7 %
Prostate Specific Ag, Serum: 1.2 ng/mL (ref 0.0–4.0)

## 2014-11-30 ENCOUNTER — Other Ambulatory Visit: Payer: Self-pay | Admitting: Family

## 2014-12-24 ENCOUNTER — Telehealth: Payer: Self-pay

## 2014-12-24 NOTE — Telephone Encounter (Signed)
Insurance prior authorized Qvar through 06/12/15

## 2014-12-29 ENCOUNTER — Telehealth: Payer: Self-pay

## 2014-12-29 NOTE — Telephone Encounter (Signed)
Insurance prior authorized [employer] [id number]

## 2015-02-16 ENCOUNTER — Telehealth: Payer: Self-pay

## 2015-02-16 NOTE — Telephone Encounter (Signed)
Insurance prior authorized Ventolin HFA through 06/12/15

## 2015-03-17 ENCOUNTER — Ambulatory Visit (INDEPENDENT_AMBULATORY_CARE_PROVIDER_SITE_OTHER): Payer: Medicare Other

## 2015-03-17 DIAGNOSIS — Z23 Encounter for immunization: Secondary | ICD-10-CM

## 2015-04-15 ENCOUNTER — Telehealth: Payer: Self-pay | Admitting: Family

## 2015-04-15 ENCOUNTER — Other Ambulatory Visit: Payer: Self-pay | Admitting: Family

## 2015-04-15 NOTE — Telephone Encounter (Signed)
Per [facility] - this already ready for pick up -- pt aware

## 2015-05-27 ENCOUNTER — Encounter: Payer: Self-pay | Admitting: Family

## 2015-05-27 ENCOUNTER — Ambulatory Visit (INDEPENDENT_AMBULATORY_CARE_PROVIDER_SITE_OTHER): Payer: Medicare Other | Admitting: Family

## 2015-05-27 ENCOUNTER — Encounter (INDEPENDENT_AMBULATORY_CARE_PROVIDER_SITE_OTHER): Payer: Self-pay

## 2015-05-27 VITALS — BP 131/83 | HR 78 | Temp 96.8°F | Ht 67.5 in | Wt 186.8 lb

## 2015-05-27 DIAGNOSIS — I10 Essential (primary) hypertension: Secondary | ICD-10-CM | POA: Diagnosis not present

## 2015-05-27 DIAGNOSIS — I25810 Atherosclerosis of coronary artery bypass graft(s) without angina pectoris: Secondary | ICD-10-CM | POA: Diagnosis not present

## 2015-05-27 DIAGNOSIS — I739 Peripheral vascular disease, unspecified: Secondary | ICD-10-CM

## 2015-05-27 DIAGNOSIS — J4520 Mild intermittent asthma, uncomplicated: Secondary | ICD-10-CM

## 2015-05-27 DIAGNOSIS — E785 Hyperlipidemia, unspecified: Secondary | ICD-10-CM

## 2015-05-27 DIAGNOSIS — E559 Vitamin D deficiency, unspecified: Secondary | ICD-10-CM

## 2015-05-27 MED ORDER — PRAVASTATIN SODIUM 40 MG PO TABS: 40.0000 mg | ORAL_TABLET | Freq: Every day | ORAL | Status: DC

## 2015-05-27 NOTE — Progress Notes (Signed)
Subjective:    Patient ID: Jared Cruz, male    DOB: 16-Aug-1933, 81 y.o.   MRN: [id number]  Pt presents to the office today for chronic follow up. PT states he received a paper from his insurance states they would no longer cover Livalo in 2017. Pt wants to discuss changing this medication.  Hypertension This is a chronic problem. The current episode started more than 1 year ago. The problem has been resolved since onset. The problem is controlled. Pertinent negatives include no anxiety, blurred vision, headaches, palpitations, peripheral edema or shortness of breath. Risk factors for coronary artery disease include dyslipidemia, male gender and family history. Past treatments include calcium channel blockers, direct vasodilators and central alpha agonists. The current treatment provides moderate improvement. Hypertensive end-organ damage includes CAD/MI. There is no history of kidney disease, CVA, heart failure or a thyroid problem. There is no history of sleep apnea.  Hyperlipidemia This is a chronic problem. The current episode started more than 1 year ago. The problem is controlled. Recent lipid tests were reviewed and are normal. He has no history of diabetes or hypothyroidism. Factors aggravating his hyperlipidemia include beta blockers. Pertinent negatives include no leg pain, myalgias or shortness of breath. Current antihyperlipidemic treatment includes fibric acid derivatives and ezetimibe. The current treatment provides moderate improvement of lipids. Risk factors for coronary artery disease include dyslipidemia, family history, hypertension and male sex.  Asthma He complains of wheezing ("if I get around strong smells"). There is no chest tightness, frequent throat clearing, hoarse voice or shortness of breath. This is a chronic problem. The current episode started more than 1 year ago. The problem occurs intermittently. The problem has been resolved. Associated symptoms include sneezing.  Pertinent negatives include no ear congestion, headaches or myalgias. His symptoms are aggravated by change in weather and pollen. His symptoms are alleviated by steroid inhaler, ipratropium and leukotriene antagonist. He reports significant improvement on treatment. His past medical history is significant for asthma. There is no history of COPD.  Back Pain This is a chronic problem. The current episode started more than 1 month ago. The problem occurs intermittently. The problem has been waxing and waning since onset. The pain is present in the lumbar spine. The quality of the pain is described as aching. The pain is at a severity of 6/10. The pain is moderate. The symptoms are aggravated by lying down. Pertinent negatives include no dysuria, headaches, leg pain, numbness, perianal numbness or tingling. He has tried bed rest for the symptoms. The treatment provided mild relief.      Review of Systems  Constitutional: Negative.   HENT: Positive for sneezing. Negative for hoarse voice.   Eyes: Negative for blurred vision.  Respiratory: Positive for wheezing ("if I get around strong smells"). Negative for shortness of breath.   Cardiovascular: Negative.  Negative for palpitations.  Gastrointestinal: Negative.   Endocrine: Negative.   Genitourinary: Negative.  Negative for dysuria.  Musculoskeletal: Positive for back pain. Negative for myalgias.  Neurological: Negative.  Negative for tingling, numbness and headaches.  Hematological: Negative.   Psychiatric/Behavioral: Negative.   All other systems reviewed and are negative.      Objective:   Physical Exam  Constitutional: He is oriented to person, place, and time. He appears well-developed and well-nourished. No distress.  HENT:  Head: Normocephalic.  Right Ear: External ear normal.  Left Ear: External ear normal.  Nose: Nose normal.  Mouth/Throat: Oropharynx is clear and moist.  Eyes: Pupils are equal,  round, and reactive to light.  Right eye exhibits no discharge. Left eye exhibits no discharge.  Neck: Normal range of motion. Neck supple. No thyromegaly present.  Cardiovascular: Normal rate, regular rhythm, normal heart sounds and intact distal pulses.   No murmur heard. Pulmonary/Chest: Effort normal and breath sounds normal. No respiratory distress. He has no wheezes.  Abdominal: Soft. Bowel sounds are normal. He exhibits no distension. There is no tenderness.  Musculoskeletal: Normal range of motion. He exhibits no edema or tenderness.  Neurological: He is alert and oriented to person, place, and time. He has normal reflexes. No cranial nerve deficit.  Skin: Skin is warm and dry. No rash noted. No erythema.  Psychiatric: He has a normal mood and affect. His behavior is normal. Judgment and thought content normal.  Vitals reviewed.   BP 131/83 mmHg  Pulse 78  Temp(Src) 96.8 F (36 C) (Oral)  Ht 5' 7.5" (1.715 m)  Wt 186 lb 12.8 oz (84.732 kg)  BMI 28.81 kg/m2       Assessment & Plan:  1. Coronary artery disease involving coronary bypass graft of native heart without angina pectoris - CMP14+EGFR  2. Essential hypertension - CMP14+EGFR  3. PVD - CMP14+EGFR  4. Asthma, mild intermittent, uncomplicated - ICH79+GVSY  5. Hyperlipidemia - CMP14+EGFR - Lipid panel - pravastatin (PRAVACHOL) 40 MG tablet; Take 1 tablet (40 mg total) by mouth daily.  Dispense: 90 tablet; Refill: 1  6. Vitamin D deficiency - CMP14+EGFR - VITAMIN D 25 Hydroxy (Vit-D Deficiency, Fractures)   Continue all meds Labs pending Health Maintenance reviewed Diet and exercise encouraged RTO 6 months  Evelina Dun, FNP

## 2015-05-27 NOTE — Patient Instructions (Signed)

## 2015-05-28 LAB — CMP14+EGFR
ALBUMIN: 4.9 g/dL — AB (ref 3.5–4.8)
ALT: 12 IU/L (ref 0–44)
AST: 16 IU/L (ref 0–40)
Albumin/Globulin Ratio: 1.6 (ref 1.1–2.5)
Alkaline Phosphatase: 55 IU/L (ref 39–117)
BILIRUBIN TOTAL: 0.5 mg/dL (ref 0.0–1.2)
BUN/Creatinine Ratio: 16 (ref 10–22)
BUN: 25 mg/dL (ref 8–27)
CALCIUM: 10.1 mg/dL (ref 8.6–10.2)
CO2: 21 mmol/L (ref 18–29)
CREATININE: 1.59 mg/dL — AB (ref 0.76–1.27)
Chloride: 102 mmol/L (ref 96–106)
GFR calc non Af Amer: 41 mL/min/{1.73_m2} — ABNORMAL LOW (ref >59)
GFR, EST AFRICAN AMERICAN: 47 mL/min/{1.73_m2} — AB (ref >59)
GLUCOSE: 93 mg/dL (ref 65–99)
Globulin, Total: 3 g/dL (ref 1.5–4.5)
Potassium: 5.2 mmol/L (ref 3.5–5.2)
Sodium: 146 mmol/L — ABNORMAL HIGH (ref 134–144)
Total Protein: 7.9 g/dL (ref 6.0–8.5)

## 2015-05-28 LAB — LIPID PANEL
Chol/HDL Ratio: 3.4 ratio units (ref 0.0–5.0)
Cholesterol, Total: 176 mg/dL (ref 100–199)
HDL: 52 mg/dL (ref >39)
LDL CALC: 104 mg/dL — AB (ref 0–99)
Triglycerides: 99 mg/dL (ref 0–149)
VLDL CHOLESTEROL CAL: 20 mg/dL (ref 5–40)

## 2015-05-28 LAB — VITAMIN D 25 HYDROXY (VIT D DEFICIENCY, FRACTURES): Vit D, 25-Hydroxy: 39.2 ng/mL (ref 30.0–100.0)

## 2015-06-03 ENCOUNTER — Other Ambulatory Visit: Payer: Self-pay | Admitting: Family

## 2015-07-01 ENCOUNTER — Telehealth: Payer: Self-pay

## 2015-07-01 MED ORDER — FLUTICASONE PROPIONATE (INHAL) 50 MCG/BLIST IN AEPB: 1.0000 | INHALATION_SPRAY | Freq: Two times a day (BID) | RESPIRATORY_TRACT | Status: DC

## 2015-07-01 NOTE — Telephone Encounter (Signed)
Pt's QVAR d/c and Flovent Prescription sent to pharmacy because of insurance

## 2015-07-01 NOTE — Telephone Encounter (Signed)
Detailed message left for patient with medication change.

## 2015-07-01 NOTE — Telephone Encounter (Signed)
Insurance denied Qvar  Must try and fail Arnuity Ellipta, Flovent Diskus or Flovent HFA    Insurance approved Ventolin HFA through 06/11/16

## 2015-07-02 ENCOUNTER — Other Ambulatory Visit: Payer: Self-pay | Admitting: Family

## 2015-07-03 MED ORDER — FLUTICASONE PROPIONATE HFA 44 MCG/ACT IN AERO: 2.0000 | INHALATION_SPRAY | Freq: Two times a day (BID) | RESPIRATORY_TRACT | Status: DC

## 2015-07-03 NOTE — Telephone Encounter (Signed)
[facility] called about pt calling them for 2 days to refill qvar 40 or ventolin hfa. Pharmacy states that pt said that the flowvent burns his throat and would like something else called in. Please advise

## 2015-07-03 NOTE — Telephone Encounter (Signed)
Changing from diskus to [facility] due to throat burning. Per pharmacy message.  Jared Apple, MD [facility] 07/03/2015, 12:13 PM

## 2015-07-08 ENCOUNTER — Telehealth: Payer: Self-pay

## 2015-07-08 NOTE — Telephone Encounter (Signed)
Insurance denied Qvar and you changed him to [employer]   Patient is telling pharmacy that he took that before and it almost killed him    The other one that is formulary is [name] features [employer]

## 2015-07-08 NOTE — Telephone Encounter (Signed)
Defer to PCP.   Changed while I was on call on Saturday.   Laroy Apple, MD [facility] 07/08/2015, 5:01 PM

## 2015-07-09 MED ORDER — FLUTICASONE FUROATE 100 MCG/ACT IN AEPB: 1.0000 | INHALATION_SPRAY | Freq: Every day | RESPIRATORY_TRACT | Status: DC

## 2015-07-09 NOTE — Addendum Note (Signed)
Addended by: Evelina Dun A on: 07/09/2015 09:40 AM   Modules accepted: Orders

## 2015-07-09 NOTE — Telephone Encounter (Signed)
RX of Arnuity sent to pharmacy. Pt to stop QVAR and Flovent

## 2015-07-09 NOTE — Telephone Encounter (Signed)
Pt aware.

## 2015-09-22 ENCOUNTER — Other Ambulatory Visit: Payer: Self-pay | Admitting: Family

## 2015-09-22 NOTE — Telephone Encounter (Signed)
Refilled per protocol. Visit 05/2015 next sched for 11/25/15

## 2015-09-27 ENCOUNTER — Other Ambulatory Visit: Payer: Self-pay | Admitting: *Deleted

## 2015-09-27 MED ORDER — AMLODIPINE BESYLATE 10 MG PO TABS: 10.0000 mg | ORAL_TABLET | Freq: Every day | ORAL | Status: DC

## 2015-09-27 NOTE — Telephone Encounter (Signed)
Patient lost the rx for amlodipine and needs a new rx sent to pharmacy

## 2015-10-15 ENCOUNTER — Other Ambulatory Visit: Payer: Self-pay | Admitting: Family

## 2015-11-25 ENCOUNTER — Ambulatory Visit (INDEPENDENT_AMBULATORY_CARE_PROVIDER_SITE_OTHER): Payer: Medicare Other | Admitting: Family

## 2015-11-25 ENCOUNTER — Encounter: Payer: Self-pay | Admitting: Family

## 2015-11-25 VITALS — BP 125/77 | HR 85 | Temp 97.3°F | Ht 67.5 in | Wt 191.6 lb

## 2015-11-25 DIAGNOSIS — E663 Overweight: Secondary | ICD-10-CM | POA: Insufficient documentation

## 2015-11-25 DIAGNOSIS — E785 Hyperlipidemia, unspecified: Secondary | ICD-10-CM

## 2015-11-25 DIAGNOSIS — M545 Low back pain: Secondary | ICD-10-CM | POA: Diagnosis not present

## 2015-11-25 DIAGNOSIS — I10 Essential (primary) hypertension: Secondary | ICD-10-CM | POA: Diagnosis not present

## 2015-11-25 DIAGNOSIS — J4520 Mild intermittent asthma, uncomplicated: Secondary | ICD-10-CM

## 2015-11-25 DIAGNOSIS — E559 Vitamin D deficiency, unspecified: Secondary | ICD-10-CM | POA: Diagnosis not present

## 2015-11-25 DIAGNOSIS — K4090 Unilateral inguinal hernia, without obstruction or gangrene, not specified as recurrent: Secondary | ICD-10-CM | POA: Diagnosis not present

## 2015-11-25 DIAGNOSIS — I25810 Atherosclerosis of coronary artery bypass graft(s) without angina pectoris: Secondary | ICD-10-CM | POA: Diagnosis not present

## 2015-11-25 LAB — CMP14+EGFR
ALK PHOS: 51 IU/L (ref 39–117)
ALT: 13 IU/L (ref 0–44)
AST: 16 IU/L (ref 0–40)
Albumin/Globulin Ratio: 1.3 (ref 1.2–2.2)
Albumin: 4.4 g/dL (ref 3.5–4.8)
BUN/Creatinine Ratio: 11 (ref 10–24)
BUN: 15 mg/dL (ref 8–27)
Bilirubin Total: 0.3 mg/dL (ref 0.0–1.2)
CALCIUM: 10.2 mg/dL (ref 8.6–10.2)
CO2: 23 mmol/L (ref 18–29)
CREATININE: 1.36 mg/dL — AB (ref 0.76–1.27)
Chloride: 104 mmol/L (ref 96–106)
GFR calc Af Amer: 57 mL/min/{1.73_m2} — ABNORMAL LOW (ref >59)
GFR, EST NON AFRICAN AMERICAN: 49 mL/min/{1.73_m2} — AB (ref >59)
GLUCOSE: 98 mg/dL (ref 65–99)
Globulin, Total: 3.3 g/dL (ref 1.5–4.5)
Potassium: 5 mmol/L (ref 3.5–5.2)
Sodium: 145 mmol/L — ABNORMAL HIGH (ref 134–144)
Total Protein: 7.7 g/dL (ref 6.0–8.5)

## 2015-11-25 LAB — CBC WITH DIFFERENTIAL/PLATELET
BASOS: 1 %
Basophils Absolute: 0 10*3/uL (ref 0.0–0.2)
EOS (ABSOLUTE): 0.3 10*3/uL (ref 0.0–0.4)
EOS: 4 %
HEMATOCRIT: 45.1 % (ref 37.5–51.0)
Hemoglobin: 15.1 g/dL (ref 12.6–17.7)
IMMATURE GRANS (ABS): 0 10*3/uL (ref 0.0–0.1)
IMMATURE GRANULOCYTES: 0 %
LYMPHS: 38 %
Lymphocytes Absolute: 2.6 10*3/uL (ref 0.7–3.1)
MCH: 29.8 pg (ref 26.6–33.0)
MCHC: 33.5 g/dL (ref 31.5–35.7)
MCV: 89 fL (ref 79–97)
Monocytes Absolute: 0.3 10*3/uL (ref 0.1–0.9)
Monocytes: 5 %
NEUTROS PCT: 52 %
Neutrophils Absolute: 3.6 10*3/uL (ref 1.4–7.0)
PLATELETS: 217 10*3/uL (ref 150–379)
RBC: 5.06 x10E6/uL (ref 4.14–5.80)
RDW: 14.1 % (ref 12.3–15.4)
WBC: 6.8 10*3/uL (ref 3.4–10.8)

## 2015-11-25 NOTE — Progress Notes (Signed)
Subjective:    Patient ID: Jared Cruz, male    DOB: Mar 07, 1934, 82 y.o.   MRN: [id number]  Pt presents to the office today for chronic follow up. Pt states he believes he has a hernia. PT states he noticed some swelling in his right groin and it has become worse. PT denies any discoloration or pain.  Hypertension This is a chronic problem. The current episode started more than 1 year ago. The problem has been resolved since onset. The problem is controlled. Pertinent negatives include no anxiety, blurred vision, headaches, palpitations, peripheral edema or shortness of breath. Risk factors for coronary artery disease include dyslipidemia, male gender and family history. Past treatments include calcium channel blockers, direct vasodilators and central alpha agonists. The current treatment provides moderate improvement. Hypertensive end-organ damage includes CAD/MI. There is no history of kidney disease, CVA, heart failure or a thyroid problem. There is no history of sleep apnea.  Hyperlipidemia This is a chronic problem. The current episode started more than 1 year ago. The problem is controlled. Recent lipid tests were reviewed and are normal. He has no history of diabetes or hypothyroidism. Factors aggravating his hyperlipidemia include beta blockers. Pertinent negatives include no leg pain, myalgias or shortness of breath. Current antihyperlipidemic treatment includes fibric acid derivatives and ezetimibe. The current treatment provides moderate improvement of lipids. Risk factors for coronary artery disease include dyslipidemia, family history, hypertension and male sex.  Asthma He complains of hoarse voice and wheezing ("if I get around strong smells"). There is no chest tightness, cough, difficulty breathing, frequent throat clearing or shortness of breath. This is a chronic problem. The current episode started more than 1 year ago. The problem occurs intermittently. The problem has been  resolved. Associated symptoms include sneezing. Pertinent negatives include no ear congestion, headaches or myalgias. His symptoms are aggravated by change in weather and pollen. His symptoms are alleviated by steroid inhaler, ipratropium and leukotriene antagonist. He reports significant improvement on treatment. His past medical history is significant for asthma. There is no history of COPD.  Back Pain This is a chronic problem. The current episode started more than 1 month ago. The problem occurs intermittently. The problem has been waxing and waning since onset. The pain is present in the lumbar spine. The quality of the pain is described as aching. The pain is at a severity of 6/10. The pain is moderate. The symptoms are aggravated by lying down. Pertinent negatives include no dysuria, headaches, leg pain, numbness, perianal numbness or tingling. He has tried bed rest for the symptoms. The treatment provided mild relief.      Review of Systems  Constitutional: Negative.   HENT: Positive for hoarse voice and sneezing.   Eyes: Negative for blurred vision.  Respiratory: Positive for wheezing ("if I get around strong smells"). Negative for cough and shortness of breath.   Cardiovascular: Negative.  Negative for palpitations.  Gastrointestinal: Negative.   Endocrine: Negative.   Genitourinary: Negative.  Negative for dysuria.  Musculoskeletal: Positive for back pain. Negative for myalgias.  Neurological: Negative.  Negative for tingling, numbness and headaches.  Hematological: Negative.   Psychiatric/Behavioral: Negative.   All other systems reviewed and are negative.      Objective:   Physical Exam  Constitutional: He is oriented to person, place, and time. He appears well-developed and well-nourished. No distress.  HENT:  Head: Normocephalic.  Right Ear: External ear normal.  Left Ear: External ear normal.  Nose: Nose normal.  Mouth/Throat: Oropharynx  is clear and moist.  Eyes:  Pupils are equal, round, and reactive to light. Right eye exhibits no discharge. Left eye exhibits no discharge.  Neck: Normal range of motion. Neck supple. No thyromegaly present.  Cardiovascular: Normal rate, regular rhythm and intact distal pulses.   Murmur heard. Pulmonary/Chest: Effort normal and breath sounds normal. No respiratory distress. He has no wheezes.  Abdominal: Soft. Bowel sounds are normal. He exhibits no distension. There is no tenderness.  Genitourinary:  Right Inguinal Hernia present, no pain or discoloration noted   Musculoskeletal: Normal range of motion. He exhibits no edema or tenderness.  Neurological: He is alert and oriented to person, place, and time. He has normal reflexes. No cranial nerve deficit.  Skin: Skin is warm and dry. No rash noted. No erythema.  Psychiatric: He has a normal mood and affect. His behavior is normal. Judgment and thought content normal.  Vitals reviewed.   BP 125/77 mmHg  Pulse 85  Temp(Src) 97.3 F (36.3 C) (Oral)  Ht 5' 7.5" (1.715 m)  Wt 191 lb 9.6 oz (86.909 kg)  BMI 29.55 kg/m2       Assessment & Plan:  1. Essential hypertension - CMP14+EGFR - CBC with Differential/Platelet  2. Coronary artery disease involving coronary bypass graft of native heart without angina pectoris - CMP14+EGFR - CBC with Differential/Platelet  3. Asthma, mild intermittent, uncomplicated - SEG31+DVVO - CBC with Differential/Platelet  4. Hyperlipidemia - CMP14+EGFR - CBC with Differential/Platelet  5. Bilateral low back pain, with sciatica presence unspecified - CMP14+EGFR - CBC with Differential/Platelet  6. Vitamin D deficiency - CMP14+EGFR - CBC with Differential/Platelet  7. Overweight (BMI 25.0-29.9) - CMP14+EGFR - CBC with Differential/Platelet  8. Unilateral inguinal hernia without obstruction or gangrene, recurrence not specified - CMP14+EGFR - Ambulatory referral to General Surgery - CBC with  Differential/Platelet   Continue all meds Labs pending Health Maintenance reviewed Diet and exercise encouraged RTO 6 months  Evelina Dun, FNP

## 2015-11-25 NOTE — Patient Instructions (Signed)

## 2015-12-02 ENCOUNTER — Ambulatory Visit: Payer: Self-pay | Admitting: General Surgery
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
## 2016-02-29 ENCOUNTER — Ambulatory Visit (INDEPENDENT_AMBULATORY_CARE_PROVIDER_SITE_OTHER): Payer: Medicare Other

## 2016-02-29 DIAGNOSIS — Z23 Encounter for immunization: Secondary | ICD-10-CM | POA: Diagnosis not present

## 2016-03-09 ENCOUNTER — Other Ambulatory Visit: Payer: Self-pay | Admitting: Family

## 2016-03-16 ENCOUNTER — Other Ambulatory Visit: Payer: Self-pay | Admitting: Family

## 2016-03-16 DIAGNOSIS — E785 Hyperlipidemia, unspecified: Secondary | ICD-10-CM

## 2016-03-20 ENCOUNTER — Ambulatory Visit (INDEPENDENT_AMBULATORY_CARE_PROVIDER_SITE_OTHER): Payer: Medicare Other | Admitting: Pediatrics

## 2016-03-20 ENCOUNTER — Encounter: Payer: Self-pay | Admitting: Pediatrics

## 2016-03-20 VITALS — BP 135/89 | HR 86 | Temp 97.4°F | Ht 67.5 in | Wt 187.6 lb

## 2016-03-20 DIAGNOSIS — I059 Rheumatic mitral valve disease, unspecified: Secondary | ICD-10-CM | POA: Diagnosis not present

## 2016-03-20 DIAGNOSIS — R42 Dizziness and giddiness: Secondary | ICD-10-CM | POA: Diagnosis not present

## 2016-03-20 DIAGNOSIS — I10 Essential (primary) hypertension: Secondary | ICD-10-CM | POA: Diagnosis not present

## 2016-03-20 MED ORDER — CLONIDINE HCL 0.1 MG PO TABS: ORAL_TABLET | ORAL | 0 refills | Status: DC

## 2016-03-20 NOTE — Patient Instructions (Signed)
Take clonidine twice a day instead of 3 times a day Blood work today I ordered the ECHO Avoid bending over Let me know if blood pressure is regularly < 110 on top Let me know if any further events happen

## 2016-03-20 NOTE — Progress Notes (Signed)
  Subjective:   Patient ID: Jared Cruz, male    DOB: 05/05/1934, 82 y.o.   MRN: [id number] CC: Night Sweats and Dizziness  HPI: Jared Cruz is a 82 y.o. male presenting for Night Sweats and Dizziness  2 weeks ago had episode of serious sweats and swimmyheadedness. Also had nausea with it Feels like room is spinning First episode when cooking breakfast Other episode happened at night when he lay down Spinning lasts for about 5 minutes Wakes him up from sleep the sleep Every time he stops he falls Last time was two nights ago When he rolled over about 3 am he noticed the spinning then the sweating Happened when his head was lower than his feet As soon as he got head up he felt better and spinning stopped  First episode happened when he bent over cleaning something off the floor Got his inhaler afterwards   HTN: 120s/70s usually, lowest was 100/69  No toher night sweats other than one time associated with vertigo as above Appetite has been good No weight loss No chest pain or pressure No symptoms with exertion  Relevant past medical, surgical, family and social history reviewed. Allergies and medications reviewed and updated. History  Smoking Status  . Never Smoker  Smokeless Tobacco  . Never Used   ROS: Per HPI   Objective:    BP 135/89   Pulse 86   Temp 97.4 F (36.3 C) (Oral)   Ht 5' 7.5" (1.715 m)   Wt 187 lb 9.6 oz (85.1 kg)   BMI 28.95 kg/m   Wt Readings from Last 3 Encounters:  03/20/16 187 lb 9.6 oz (85.1 kg)  11/25/15 191 lb 9.6 oz (86.9 kg)  05/27/15 186 lb 12.8 oz (84.7 kg)    Gen: NAD, alert, cooperative with exam, NCAT EYES: EOMI, no conjunctival injection, or no icterus CV: NRRR, normal S1, blurred S2, midsystolic murmur, distal pulses 2+ b/l Resp: CTABL, no wheezes, normal WOB Abd: +BS, soft, NTND. no guarding or organomegaly Ext: No edema, warm Neuro: Alert and oriented, strength equal b/l UE and LE, coordination grossly normal MSK:  normal muscle bulk  Assessment & Plan:  Fannie was seen today for episodes of veritgo, come on when he bends over or moves head, has happened twice.   Diagnoses and all orders for this visit:  MITRAL VALVE PROLAPSE Recheck ECHO, last in 2013 -     ECHOCARDIOGRAM COMPLETE; Future  Essential hypertension BPs at home sometimes on low side, down to 100s/60s. Decrease to BID clonidine Continue to check at home, if regularly low will let us know -     cloNIDine (CATAPRES) 0.1 MG tablet; Take 1 tablet (0.1 mg total) by mouth 2 times daily.  Vertigo -     ECHOCARDIOGRAM COMPLETE; Future -     BMP8+EGFR -     CBC with Differential/Platelet   Follow up plan: As scheduled in 2 mo, sooner if needed Assunta Found, MD [place]

## 2016-03-21 ENCOUNTER — Other Ambulatory Visit: Payer: Self-pay | Admitting: Family

## 2016-03-21 ENCOUNTER — Telehealth: Payer: Self-pay | Admitting: Pediatrics

## 2016-03-21 DIAGNOSIS — I10 Essential (primary) hypertension: Secondary | ICD-10-CM

## 2016-03-21 LAB — BMP8+EGFR
BUN / CREAT RATIO: 9 — AB (ref 10–24)
BUN: 13 mg/dL (ref 8–27)
CHLORIDE: 101 mmol/L (ref 96–106)
CO2: 24 mmol/L (ref 18–29)
CREATININE: 1.42 mg/dL — AB (ref 0.76–1.27)
Calcium: 10.6 mg/dL — ABNORMAL HIGH (ref 8.6–10.2)
GFR calc Af Amer: 54 mL/min/{1.73_m2} — ABNORMAL LOW (ref >59)
GFR calc non Af Amer: 47 mL/min/{1.73_m2} — ABNORMAL LOW (ref >59)
GLUCOSE: 106 mg/dL — AB (ref 65–99)
Potassium: 5.5 mmol/L — ABNORMAL HIGH (ref 3.5–5.2)
Sodium: 145 mmol/L — ABNORMAL HIGH (ref 134–144)

## 2016-03-21 LAB — CBC WITH DIFFERENTIAL/PLATELET
BASOS ABS: 0 10*3/uL (ref 0.0–0.2)
BASOS: 1 %
EOS (ABSOLUTE): 0.1 10*3/uL (ref 0.0–0.4)
Eos: 1 %
Hematocrit: 43.4 % (ref 37.5–51.0)
Hemoglobin: 14.6 g/dL (ref 12.6–17.7)
IMMATURE GRANULOCYTES: 0 %
Immature Grans (Abs): 0 10*3/uL (ref 0.0–0.1)
Lymphocytes Absolute: 2 10*3/uL (ref 0.7–3.1)
Lymphs: 31 %
MCH: 30 pg (ref 26.6–33.0)
MCHC: 33.6 g/dL (ref 31.5–35.7)
MCV: 89 fL (ref 79–97)
MONOS ABS: 0.4 10*3/uL (ref 0.1–0.9)
Monocytes: 5 %
NEUTROS PCT: 62 %
Neutrophils Absolute: 4 10*3/uL (ref 1.4–7.0)
PLATELETS: 241 10*3/uL (ref 150–379)
RBC: 4.87 x10E6/uL (ref 4.14–5.80)
RDW: 14.1 % (ref 12.3–15.4)
WBC: 6.5 10*3/uL (ref 3.4–10.8)

## 2016-03-23 ENCOUNTER — Telehealth: Payer: Self-pay | Admitting: Family

## 2016-03-23 NOTE — Telephone Encounter (Signed)
Aware of results. 

## 2016-03-23 NOTE — Telephone Encounter (Signed)
Requesting results of last labs. Please review and advise. Thank you

## 2016-03-23 NOTE — Telephone Encounter (Signed)
Put in result note for labs, thanks and sorry for delay

## 2016-03-27 ENCOUNTER — Other Ambulatory Visit: Payer: Self-pay | Admitting: *Deleted

## 2016-03-27 MED ORDER — EZETIMIBE 10 MG PO TABS: 10.0000 mg | ORAL_TABLET | Freq: Every day | ORAL | 1 refills | Status: DC

## 2016-04-23 ENCOUNTER — Emergency Department (HOSPITAL_COMMUNITY)
Admission: EM | Admit: 2016-04-23 | Discharge: 2016-04-23 | Disposition: A | Discharge status: Home / Self Care | Payer: Medicare Other | Attending: Emergency Medicine | Admitting: Emergency Medicine

## 2016-04-23 ENCOUNTER — Emergency Department (HOSPITAL_COMMUNITY): Payer: Medicare Other

## 2016-04-23 ENCOUNTER — Encounter (HOSPITAL_COMMUNITY): Payer: Self-pay

## 2016-04-23 DIAGNOSIS — W1809XA Striking against other object with subsequent fall, initial encounter: Secondary | ICD-10-CM | POA: Insufficient documentation

## 2016-04-23 DIAGNOSIS — J45909 Unspecified asthma, uncomplicated: Secondary | ICD-10-CM | POA: Insufficient documentation

## 2016-04-23 DIAGNOSIS — R42 Dizziness and giddiness: Secondary | ICD-10-CM | POA: Diagnosis not present

## 2016-04-23 DIAGNOSIS — Y929 Unspecified place or not applicable: Secondary | ICD-10-CM | POA: Diagnosis not present

## 2016-04-23 DIAGNOSIS — I10 Essential (primary) hypertension: Secondary | ICD-10-CM | POA: Insufficient documentation

## 2016-04-23 DIAGNOSIS — I2510 Atherosclerotic heart disease of native coronary artery without angina pectoris: Secondary | ICD-10-CM | POA: Insufficient documentation

## 2016-04-23 DIAGNOSIS — Y999 Unspecified external cause status: Secondary | ICD-10-CM | POA: Insufficient documentation

## 2016-04-23 DIAGNOSIS — S0993XA Unspecified injury of face, initial encounter: Secondary | ICD-10-CM | POA: Diagnosis present

## 2016-04-23 DIAGNOSIS — Z79899 Other long term (current) drug therapy: Secondary | ICD-10-CM | POA: Diagnosis not present

## 2016-04-23 DIAGNOSIS — S00531A Contusion of lip, initial encounter: Secondary | ICD-10-CM | POA: Diagnosis not present

## 2016-04-23 DIAGNOSIS — W19XXXA Unspecified fall, initial encounter: Secondary | ICD-10-CM

## 2016-04-23 DIAGNOSIS — Y939 Activity, unspecified: Secondary | ICD-10-CM | POA: Diagnosis not present

## 2016-04-23 DIAGNOSIS — Z7982 Long term (current) use of aspirin: Secondary | ICD-10-CM | POA: Diagnosis not present

## 2016-04-23 DIAGNOSIS — S0083XA Contusion of other part of head, initial encounter: Secondary | ICD-10-CM

## 2016-04-23 HISTORY — DX: Dizziness and giddiness: R42

## 2016-04-23 NOTE — ED Triage Notes (Signed)
I have vertigo and fell today, face first.  Injury to lip and left jaw with swelling, and abrasions to both knees.  Did not lose consciousness.  Denies headaches or changes in mental status.
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
## 2016-04-23 NOTE — ED Notes (Signed)
Pt has a history of vertigo for which he takes mustard- He reports he fell today with abraisions to his knees bilaterally- He fell onto his face and now has swelling to his left lower jaw as well as his lips- he has a small cit to his inner lip. He is neuro intact with ad lib movement as well as conversant.  He is evaluated by the physician, is given an ice pack and awaits further evaluation

## 2016-04-24 ENCOUNTER — Encounter: Payer: Self-pay | Admitting: *Deleted

## 2016-05-25 ENCOUNTER — Ambulatory Visit (INDEPENDENT_AMBULATORY_CARE_PROVIDER_SITE_OTHER): Payer: Medicare Other | Admitting: Family

## 2016-05-25 ENCOUNTER — Encounter: Payer: Self-pay | Admitting: Family

## 2016-05-25 VITALS — BP 155/88 | HR 103 | Temp 97.9°F | Ht 67.0 in | Wt 187.0 lb

## 2016-05-25 DIAGNOSIS — E663 Overweight: Secondary | ICD-10-CM

## 2016-05-25 DIAGNOSIS — I059 Rheumatic mitral valve disease, unspecified: Secondary | ICD-10-CM | POA: Diagnosis not present

## 2016-05-25 DIAGNOSIS — M545 Low back pain: Secondary | ICD-10-CM | POA: Diagnosis not present

## 2016-05-25 DIAGNOSIS — J45909 Unspecified asthma, uncomplicated: Secondary | ICD-10-CM

## 2016-05-25 DIAGNOSIS — E785 Hyperlipidemia, unspecified: Secondary | ICD-10-CM | POA: Diagnosis not present

## 2016-05-25 DIAGNOSIS — H8110 Benign paroxysmal vertigo, unspecified ear: Secondary | ICD-10-CM | POA: Diagnosis not present

## 2016-05-25 DIAGNOSIS — E559 Vitamin D deficiency, unspecified: Secondary | ICD-10-CM

## 2016-05-25 DIAGNOSIS — I25810 Atherosclerosis of coronary artery bypass graft(s) without angina pectoris: Secondary | ICD-10-CM

## 2016-05-25 DIAGNOSIS — I739 Peripheral vascular disease, unspecified: Secondary | ICD-10-CM

## 2016-05-25 DIAGNOSIS — I10 Essential (primary) hypertension: Secondary | ICD-10-CM

## 2016-05-25 MED ORDER — MECLIZINE HCL 25 MG PO TABS: 25.0000 mg | ORAL_TABLET | Freq: Three times a day (TID) | ORAL | 0 refills | Status: DC | PRN

## 2016-05-25 NOTE — Progress Notes (Signed)
Subjective:    Patient ID: Jared Cruz, male    DOB: 04/26/1934, 82 y.o.   MRN: [id number]  Pt presents to the office today for chronic follow up. Pt has CAD, PVD, mitral valve prolapse, and hyperlipidemia. Pt states he has not seen a Cardiologists since 09/2010. Hypertension  This is a chronic problem. The current episode started more than 1 year ago. The problem has been waxing and waning since onset. The problem is controlled. Pertinent negatives include no anxiety, blurred vision, headaches, palpitations, peripheral edema or shortness of breath. Risk factors for coronary artery disease include dyslipidemia, male gender and family history. Past treatments include calcium channel blockers and central alpha agonists. The current treatment provides moderate improvement. Hypertensive end-organ damage includes CAD/MI. There is no history of kidney disease, CVA, heart failure or a thyroid problem. There is no history of sleep apnea.  Hyperlipidemia  This is a chronic problem. The current episode started more than 1 year ago. The problem is controlled. He has no history of diabetes or hypothyroidism. Factors aggravating his hyperlipidemia include beta blockers. Pertinent negatives include no leg pain, myalgias or shortness of breath. Current antihyperlipidemic treatment includes fibric acid derivatives and ezetimibe. The current treatment provides moderate improvement of lipids. Risk factors for coronary artery disease include dyslipidemia, family history, hypertension and male sex.  Dizziness  This is a new problem. The current episode started more than 1 month ago. The problem occurs intermittently. The problem has been waxing and waning. Associated symptoms include vertigo. Pertinent negatives include no coughing, headaches, myalgias or numbness. The symptoms are aggravated by bending. He has tried rest for the symptoms. The treatment provided no relief.  Asthma  He complains of hoarse voice and  wheezing ("if I get around strong smells"). There is no chest tightness, cough, difficulty breathing, frequent throat clearing or shortness of breath. This is a chronic problem. The current episode started more than 1 year ago. The problem occurs intermittently. The problem has been resolved. Associated symptoms include sneezing. Pertinent negatives include no ear congestion, headaches or myalgias. His symptoms are aggravated by change in weather and pollen. His symptoms are alleviated by steroid inhaler, ipratropium and leukotriene antagonist. He reports significant improvement on treatment. His past medical history is significant for asthma. There is no history of COPD.  Back Pain  This is a chronic problem. The current episode started more than 1 month ago. The problem occurs intermittently. The problem has been waxing and waning since onset. The pain is present in the lumbar spine. The quality of the pain is described as aching. The pain is at a severity of 1/10. The patient is experiencing no pain. The symptoms are aggravated by lying down. Pertinent negatives include no dysuria, headaches, leg pain, numbness, perianal numbness or tingling. He has tried bed rest for the symptoms. The treatment provided mild relief.      Review of Systems  Constitutional: Negative.   HENT: Positive for hoarse voice and sneezing.   Eyes: Negative for blurred vision.  Respiratory: Positive for wheezing ("if I get around strong smells"). Negative for cough and shortness of breath.   Cardiovascular: Negative.  Negative for palpitations.  Gastrointestinal: Negative.   Endocrine: Negative.   Genitourinary: Negative.  Negative for dysuria.  Musculoskeletal: Positive for back pain. Negative for myalgias.  Neurological: Positive for dizziness and vertigo. Negative for tingling, numbness and headaches.  Hematological: Negative.   Psychiatric/Behavioral: Negative.   All other systems reviewed and are negative.  Objective:   Physical Exam  Constitutional: He is oriented to person, place, and time. He appears well-developed and well-nourished. No distress.  HENT:  Head: Normocephalic.  Right Ear: External ear normal.  Left Ear: External ear normal.  Nose: Nose normal.  Mouth/Throat: Oropharynx is clear and moist.  Eyes: Pupils are equal, round, and reactive to light. Right eye exhibits no discharge. Left eye exhibits no discharge.  Neck: Normal range of motion. Neck supple. No thyromegaly present.  Cardiovascular: Normal rate, regular rhythm and intact distal pulses.   Murmur heard. Pulmonary/Chest: Effort normal and breath sounds normal. No respiratory distress. He has no wheezes.  Abdominal: Soft. Bowel sounds are normal. He exhibits no distension. There is no tenderness.  Genitourinary:  Genitourinary Comments: Right Inguinal Hernia present, no pain or discoloration noted   Musculoskeletal: Normal range of motion. He exhibits no edema or tenderness.  Neurological: He is alert and oriented to person, place, and time. He has normal reflexes. No cranial nerve deficit.  Skin: Skin is warm and dry. No rash noted. No erythema.  Psychiatric: He has a normal mood and affect. His behavior is normal. Judgment and thought content normal.  Vitals reviewed.   BP (!) 155/88   Pulse (!) 103   Temp 97.9 F (36.6 C) (Oral)   Ht '5\' 7"'$  (1.702 m)   Wt 187 lb (84.8 kg)   BMI 29.29 kg/m       Assessment & Plan:  1. Coronary artery disease involving coronary bypass graft of native heart without angina pectoris - CMP14+EGFR - Ambulatory referral to Cardiology  2. Essential hypertension - CMP14+EGFR  3. Peripheral vascular disease ([place]) - CMP14+EGFR - Lipid panel - Ambulatory referral to Cardiology  4. Uncomplicated asthma, unspecified asthma severity, unspecified whether persistent - CMP14+EGFR  5. Vitamin D deficiency - CMP14+EGFR - VITAMIN D 25 Hydroxy (Vit-D Deficiency, Fractures)  6.  Overweight (BMI 25.0-29.9) - CMP14+EGFR  7. Hyperlipidemia, unspecified hyperlipidemia type - CMP14+EGFR - Lipid panel  8. Bilateral low back pain, unspecified chronicity, with sciatica presence unspecified - CMP14+EGFR  9. Benign paroxysmal positional vertigo, unspecified laterality -Falls precaution discussed Exercises discussed-Handout given to pt -Meclizine rx given to pt today - meclizine (ANTIVERT) 25 MG tablet; Take 1 tablet (25 mg total) by mouth 3 (three) times daily as needed for dizziness.  Dispense: 30 tablet; Refill: 0 - CBC with Differential/Platelet - CMP14+EGFR  10. MITRAL VALVE PROLAPSE - CMP14+EGFR - Ambulatory referral to Cardiology   Continue all meds Labs pending Health Maintenance reviewed Diet and exercise encouraged RTO 3 months to recheck BPV  Evelina Dun, FNP

## 2016-05-25 NOTE — Patient Instructions (Signed)
Benign Positional Vertigo Introduction Vertigo is the feeling that you or your surroundings are moving when they are not. Benign positional vertigo is the most common form of vertigo. The cause of this condition is not serious (is benign). This condition is triggered by certain movements and positions (is positional). This condition can be dangerous if it occurs while you are doing something that could endanger you or others, such as driving. What are the causes? In many cases, the cause of this condition is not known. It may be caused by a disturbance in an area of the inner ear that helps your brain to sense movement and balance. This disturbance can be caused by a viral infection (labyrinthitis), head injury, or repetitive motion. What increases the risk? This condition is more likely to develop in:  Women.  People who are 50 years of age or older. What are the signs or symptoms? Symptoms of this condition usually happen when you move your head or your eyes in different directions. Symptoms may start suddenly, and they usually last for less than a minute. Symptoms may include:  Loss of balance and falling.  Feeling like you are spinning or moving.  Feeling like your surroundings are spinning or moving.  Nausea and vomiting.  Blurred vision.  Dizziness.  Involuntary eye movement (nystagmus). Symptoms can be mild and cause only slight annoyance, or they can be severe and interfere with daily life. Episodes of benign positional vertigo may return (recur) over time, and they may be triggered by certain movements. Symptoms may improve over time. How is this diagnosed? This condition is usually diagnosed by medical history and a physical exam of the head, neck, and ears. You may be referred to a health care provider who specializes in ear, nose, and throat (ENT) problems (otolaryngologist) or a provider who specializes in disorders of the nervous system (neurologist). You may have  additional testing, including:  MRI.  A CT scan.  Eye movement tests. Your health care provider may ask you to change positions quickly while he or she watches you for symptoms of benign positional vertigo, such as nystagmus. Eye movement may be tested with an electronystagmogram (ENG), caloric stimulation, the Dix-Hallpike test, or the roll test.  An electroencephalogram (EEG). This records electrical activity in your brain.  Hearing tests. How is this treated? Usually, your health care provider will treat this by moving your head in specific positions to adjust your inner ear back to normal. Surgery may be needed in severe cases, but this is rare. In some cases, benign positional vertigo may resolve on its own in 2-4 weeks. Follow these instructions at home: Safety  Move slowly.Avoid sudden body or head movements.  Avoid driving.  Avoid operating heavy machinery.  Avoid doing any tasks that would be dangerous to you or others if a vertigo episode would occur.  If you have trouble walking or keeping your balance, try using a cane for stability. If you feel dizzy or unstable, sit down right away.  Return to your normal activities as told by your health care provider. Ask your health care provider what activities are safe for you. General instructions  Take over-the-counter and prescription medicines only as told by your health care provider.  Avoid certain positions or movements as told by your health care provider.  Drink enough fluid to keep your urine clear or pale yellow.  Keep all follow-up visits as told by your health care provider. This is important. Contact a health care provider if:    You have a fever.  Your condition gets worse or you develop new symptoms.  Your family or friends notice any behavioral changes.  Your nausea or vomiting gets worse.  You have numbness or a "pins and needles" sensation. Get help right away if:  You have difficulty speaking or  moving.  You are always dizzy.  You faint.  You develop severe headaches.  You have weakness in your legs or arms.  You have changes in your hearing or vision.  You develop a stiff neck.  You develop sensitivity to light. This information is not intended to replace advice given to you by your health care provider. Make sure you discuss any questions you have with your health care provider. Document Released: 03/06/2006 Document Revised: 11/04/2015 Document Reviewed: 09/21/2014  2017 Elsevier  

## 2016-05-26 ENCOUNTER — Ambulatory Visit: Payer: Medicare Other | Admitting: Family

## 2016-05-26 LAB — CMP14+EGFR
A/G RATIO: 1.6 (ref 1.2–2.2)
ALBUMIN: 4.7 g/dL (ref 3.5–4.8)
ALT: 9 IU/L (ref 0–44)
AST: 14 IU/L (ref 0–40)
Alkaline Phosphatase: 58 IU/L (ref 39–117)
BILIRUBIN TOTAL: 0.3 mg/dL (ref 0.0–1.2)
BUN / CREAT RATIO: 12 (ref 10–24)
BUN: 22 mg/dL (ref 8–27)
CHLORIDE: 105 mmol/L (ref 96–106)
CO2: 24 mmol/L (ref 18–29)
Calcium: 10.5 mg/dL — ABNORMAL HIGH (ref 8.6–10.2)
Creatinine, Ser: 1.77 mg/dL — ABNORMAL HIGH (ref 0.76–1.27)
GFR calc non Af Amer: 36 mL/min/{1.73_m2} — ABNORMAL LOW (ref >59)
GFR, EST AFRICAN AMERICAN: 41 mL/min/{1.73_m2} — AB (ref >59)
GLOBULIN, TOTAL: 3 g/dL (ref 1.5–4.5)
Glucose: 99 mg/dL (ref 65–99)
POTASSIUM: 5.5 mmol/L — AB (ref 3.5–5.2)
Sodium: 145 mmol/L — ABNORMAL HIGH (ref 134–144)
TOTAL PROTEIN: 7.7 g/dL (ref 6.0–8.5)

## 2016-05-26 LAB — CBC WITH DIFFERENTIAL/PLATELET
BASOS: 0 %
Basophils Absolute: 0 10*3/uL (ref 0.0–0.2)
EOS (ABSOLUTE): 0.3 10*3/uL (ref 0.0–0.4)
EOS: 4 %
HEMATOCRIT: 44.5 % (ref 37.5–51.0)
HEMOGLOBIN: 14.7 g/dL (ref 13.0–17.7)
IMMATURE GRANS (ABS): 0 10*3/uL (ref 0.0–0.1)
Immature Granulocytes: 0 %
LYMPHS: 35 %
Lymphocytes Absolute: 2.7 10*3/uL (ref 0.7–3.1)
MCH: 29.7 pg (ref 26.6–33.0)
MCHC: 33 g/dL (ref 31.5–35.7)
MCV: 90 fL (ref 79–97)
MONOCYTES: 8 %
Monocytes Absolute: 0.6 10*3/uL (ref 0.1–0.9)
NEUTROS ABS: 4 10*3/uL (ref 1.4–7.0)
Neutrophils: 53 %
Platelets: 238 10*3/uL (ref 150–379)
RBC: 4.95 x10E6/uL (ref 4.14–5.80)
RDW: 14.4 % (ref 12.3–15.4)
WBC: 7.7 10*3/uL (ref 3.4–10.8)

## 2016-05-26 LAB — LIPID PANEL
CHOL/HDL RATIO: 4.6 ratio (ref 0.0–5.0)
CHOLESTEROL TOTAL: 221 mg/dL — AB (ref 100–199)
HDL: 48 mg/dL (ref >39)
LDL CALC: 143 mg/dL — AB (ref 0–99)
Triglycerides: 148 mg/dL (ref 0–149)
VLDL CHOLESTEROL CAL: 30 mg/dL (ref 5–40)

## 2016-05-26 LAB — VITAMIN D 25 HYDROXY (VIT D DEFICIENCY, FRACTURES): VIT D 25 HYDROXY: 25.6 ng/mL — AB (ref 30.0–100.0)

## 2016-05-31 ENCOUNTER — Telehealth: Payer: Self-pay | Admitting: Family

## 2016-05-31 ENCOUNTER — Other Ambulatory Visit: Payer: Self-pay | Admitting: Family

## 2016-05-31 DIAGNOSIS — E875 Hyperkalemia: Secondary | ICD-10-CM

## 2016-06-30 ENCOUNTER — Ambulatory Visit: Payer: Medicare Other | Admitting: Cardiology

## 2016-07-14 ENCOUNTER — Other Ambulatory Visit: Payer: Self-pay | Admitting: *Deleted

## 2016-07-14 ENCOUNTER — Telehealth: Payer: Self-pay | Admitting: Family

## 2016-07-14 DIAGNOSIS — H8110 Benign paroxysmal vertigo, unspecified ear: Secondary | ICD-10-CM

## 2016-07-14 MED ORDER — MECLIZINE HCL 25 MG PO TABS: 25.0000 mg | ORAL_TABLET | Freq: Three times a day (TID) | ORAL | 0 refills | Status: DC | PRN

## 2016-07-14 NOTE — Telephone Encounter (Signed)
Rx sent to pharmacy   

## 2016-07-17 ENCOUNTER — Other Ambulatory Visit: Payer: Self-pay | Admitting: Family

## 2016-07-24 ENCOUNTER — Encounter: Payer: Self-pay | Admitting: Cardiology
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
## 2016-07-25 ENCOUNTER — Ambulatory Visit (INDEPENDENT_AMBULATORY_CARE_PROVIDER_SITE_OTHER): Payer: Medicare Other | Admitting: Cardiology

## 2016-07-25 ENCOUNTER — Encounter: Payer: Self-pay | Admitting: Cardiology

## 2016-07-25 ENCOUNTER — Telehealth: Payer: Self-pay

## 2016-07-25 VITALS — BP 138/88 | HR 97 | Ht 68.0 in | Wt 189.0 lb

## 2016-07-25 DIAGNOSIS — I341 Nonrheumatic mitral (valve) prolapse: Secondary | ICD-10-CM | POA: Diagnosis not present

## 2016-07-25 DIAGNOSIS — I10 Essential (primary) hypertension: Secondary | ICD-10-CM

## 2016-07-25 DIAGNOSIS — I340 Nonrheumatic mitral (valve) insufficiency: Secondary | ICD-10-CM | POA: Diagnosis not present

## 2016-07-25 DIAGNOSIS — Z789 Other specified health status: Secondary | ICD-10-CM

## 2016-07-25 DIAGNOSIS — R0989 Other specified symptoms and signs involving the circulatory and respiratory systems: Secondary | ICD-10-CM

## 2016-07-25 NOTE — Telephone Encounter (Signed)
Patient left his meds in the office today. Attempted to contact him. No answer. Unable to leave a message.

## 2016-07-25 NOTE — Patient Instructions (Signed)
Your physician wants you to follow-up in: 6 months with Dr. Domenic Polite. You will receive a reminder letter in the mail two months in advance. If you don't receive a letter, please call our office to schedule the follow-up appointment.  Your physician has requested that you have an echocardiogram. Echocardiography is a painless test that uses sound waves to create images of your heart. It provides your doctor with information about the size and shape of your heart and how well your heart's chambers and valves are working. This procedure takes approximately one hour. There are no restrictions for this procedure.    Your physician has requested that you have a carotid duplex. This test is an ultrasound of the carotid arteries in your neck. It looks at blood flow through these arteries that supply the brain with blood. Allow one hour for this exam. There are no restrictions or special instructions.    Your physician recommends that you continue on your current medications as directed. Please refer to the Current Medication list given to you today.   Thank you for choosing [facility]!!

## 2016-08-14 ENCOUNTER — Other Ambulatory Visit: Payer: Self-pay | Admitting: Family

## 2016-08-16 ENCOUNTER — Ambulatory Visit (INDEPENDENT_AMBULATORY_CARE_PROVIDER_SITE_OTHER): Payer: Medicare Other

## 2016-08-16 ENCOUNTER — Other Ambulatory Visit: Payer: Self-pay

## 2016-08-16 ENCOUNTER — Telehealth: Payer: Self-pay | Admitting: *Deleted

## 2016-08-16 ENCOUNTER — Ambulatory Visit: Payer: Medicare Other

## 2016-08-16 DIAGNOSIS — I341 Nonrheumatic mitral (valve) prolapse: Secondary | ICD-10-CM

## 2016-08-16 DIAGNOSIS — R0989 Other specified symptoms and signs involving the circulatory and respiratory systems: Secondary | ICD-10-CM | POA: Diagnosis not present

## 2016-08-16 NOTE — Telephone Encounter (Signed)
Patient informed. 

## 2016-08-16 NOTE — Telephone Encounter (Signed)
-----   Message from Satira Sark, MD sent at 08/16/2016 12:24 PM EST ----- Results reviewed. Mitral valve prolapse noted as before, no progression in degree of mitral regurgitation. Continue plan for follow-up. A copy of this test should be forwarded to Evelina Dun, FNP.

## 2016-08-17 ENCOUNTER — Other Ambulatory Visit: Payer: Self-pay | Admitting: Family

## 2016-08-18 ENCOUNTER — Telehealth: Payer: Self-pay | Admitting: *Deleted

## 2016-08-18 LAB — VAS US CAROTID
LCCADDIAS: -40 cm/s
LCCADSYS: -143 cm/s
LEFT ECA DIAS: -13 cm/s
LEFT VERTEBRAL DIAS: -18 cm/s
LICADSYS: -52 cm/s
LICAPDIAS: -34 cm/s
LICAPSYS: -133 cm/s
Left CCA prox dias: 32 cm/s
Left CCA prox sys: 126 cm/s
Left ICA dist dias: -18 cm/s
RCCADSYS: -97 cm/s
RCCAPSYS: 97 cm/s
RIGHT ECA DIAS: -17 cm/s
RIGHT VERTEBRAL DIAS: -15 cm/s
Right CCA prox dias: 20 cm/s

## 2016-08-18 NOTE — Telephone Encounter (Signed)
Patient informed. 

## 2016-08-18 NOTE — Telephone Encounter (Signed)
-----   Message from Satira Sark, MD sent at 08/18/2016  2:36 PM EST ----- Results reviewed. Carotid Dopplers show 1-39% ICA stenosis, bilaterally. Will continue medical therapy and observation. A copy of this test should be forwarded to Evelina Dun, FNP.

## 2016-09-06 ENCOUNTER — Other Ambulatory Visit: Payer: Self-pay | Admitting: Family

## 2016-09-06 DIAGNOSIS — H8110 Benign paroxysmal vertigo, unspecified ear: Secondary | ICD-10-CM

## 2016-11-23 ENCOUNTER — Ambulatory Visit (INDEPENDENT_AMBULATORY_CARE_PROVIDER_SITE_OTHER): Payer: Medicare Other | Admitting: Family

## 2016-11-23 ENCOUNTER — Other Ambulatory Visit: Payer: Self-pay | Admitting: Family

## 2016-11-23 ENCOUNTER — Encounter: Payer: Self-pay | Admitting: Family

## 2016-11-23 VITALS — BP 146/88 | HR 90 | Temp 98.4°F | Ht 68.0 in | Wt 188.4 lb

## 2016-11-23 DIAGNOSIS — E663 Overweight: Secondary | ICD-10-CM

## 2016-11-23 DIAGNOSIS — M545 Low back pain: Secondary | ICD-10-CM

## 2016-11-23 DIAGNOSIS — I739 Peripheral vascular disease, unspecified: Secondary | ICD-10-CM | POA: Diagnosis not present

## 2016-11-23 DIAGNOSIS — J45909 Unspecified asthma, uncomplicated: Secondary | ICD-10-CM

## 2016-11-23 DIAGNOSIS — I10 Essential (primary) hypertension: Secondary | ICD-10-CM

## 2016-11-23 DIAGNOSIS — E559 Vitamin D deficiency, unspecified: Secondary | ICD-10-CM

## 2016-11-23 DIAGNOSIS — H8110 Benign paroxysmal vertigo, unspecified ear: Secondary | ICD-10-CM | POA: Diagnosis not present

## 2016-11-23 DIAGNOSIS — E785 Hyperlipidemia, unspecified: Secondary | ICD-10-CM | POA: Diagnosis not present

## 2016-11-23 MED ORDER — MECLIZINE HCL 25 MG PO TABS: ORAL_TABLET | ORAL | 2 refills | Status: DC

## 2016-11-23 MED ORDER — BUDESONIDE-FORMOTEROL FUMARATE 80-4.5 MCG/ACT IN AERO: 2.0000 | INHALATION_SPRAY | Freq: Two times a day (BID) | RESPIRATORY_TRACT | 3 refills | Status: DC

## 2016-11-23 MED ORDER — EPINEPHRINE 0.3 MG/0.3ML IJ SOAJ: INTRAMUSCULAR | 3 refills | Status: DC

## 2016-11-23 NOTE — Patient Instructions (Signed)

## 2016-11-23 NOTE — Progress Notes (Signed)
Subjective:    Patient ID: Jared Cruz, male    DOB: Aug 19, 1933, 83 y.o.   MRN: [id number]  HPI PHILO KURTZ is here today for follow up of chronic medical problem.  Outpatient Encounter Prescriptions as of 11/23/2016  Medication Sig  . albuterol (PROVENTIL) (2.5 MG/3ML) 0.083% nebulizer solution NEBULIZE 1 VIAL TWICE A DAY  . amLODipine (NORVASC) 10 MG tablet Take 1 tablet (10 mg total) by mouth daily.  Marland Kitchen aspirin 81 MG EC tablet Take 81 mg by mouth daily.    . cloNIDine (CATAPRES) 0.1 MG tablet Take 1 tablet (0.1 mg total) by mouth 3 (three) times daily.  . fenofibrate (TRICOR) 48 MG tablet Take 48 mg by mouth daily.  . meclizine (ANTIVERT) 25 MG tablet Take 1 tablet 3 (three) times daily as needed for dizziness.  . montelukast (SINGULAIR) 10 MG tablet Take 1 tablet (10 mg total) by mouth daily.  . VENTOLIN HFA 108 (90 Base) MCG/ACT inhaler Inhale 2 puffs into the lungs every 6 (six) hours as needed for wheezi ng or shortness of breath.  . EPIPEN 0.3 MG/0.3ML SOAJ injection USE AS DIRECTED (Patient not taking: Reported on 11/23/2016)   No facility-administered encounter medications on file as of 11/23/2016.     1. Essential hypertension  Blood pressure managed with amlodipine.  Patient checks blood pressure every day at home.  BP runs 120s/70s.  2. Benign paroxysmal positional vertigo, unspecified laterality  Patient uses Antivert for symptom management.  Patient is still having problems with vertigo intermittently and states he has noticed any particular triggers.  3. Peripheral vascular disease ([place])  Patient uses fenofibrate for management.  4. Uncomplicated asthma, unspecified asthma severity, unspecified whether persistent Patient has ventolin inhaler and uses every day.  Patient uses albuterol nebulizer as needed.  5. Hyperlipidemia, unspecified hyperlipidemia type  Patient managed with fenofibrate.  6. Bilateral low back pain, unspecified chronicity, with sciatica  presence unspecified   7. Vitamin D deficiency  Patient gets out in the sun.  8. Overweight (BMI 25.0-29.9) No recent weight gain or loss.     New complaints: None today.    Review of Systems  Constitutional: Negative for activity change, appetite change and fatigue.  Respiratory: Negative for cough and shortness of breath.   Cardiovascular: Negative for chest pain and palpitations.  Gastrointestinal: Negative for abdominal distention and abdominal pain.  Neurological: Negative for light-headedness and headaches.  All other systems reviewed and are negative.      Objective:   Physical Exam  Constitutional: He is oriented to person, place, and time. He appears well-developed and well-nourished. No distress.  HENT:  Head: Normocephalic.  Right Ear: External ear normal.  Left Ear: External ear normal.  Nose: Nose normal.  Mouth/Throat: Oropharynx is clear and moist.  Eyes: Pupils are equal, round, and reactive to light.  Neck: Normal range of motion. Neck supple. No JVD present. No thyromegaly present.  Cardiovascular: Normal rate, regular rhythm and intact distal pulses.   Murmur heard. Pulmonary/Chest: Effort normal and breath sounds normal. No respiratory distress. He has no wheezes.  Abdominal: Soft. Bowel sounds are normal. He exhibits no distension. There is no tenderness.  Musculoskeletal: Normal range of motion.  Lymphadenopathy:    He has no cervical adenopathy.  Neurological: He is alert and oriented to person, place, and time.  Skin: Skin is warm and dry.  Psychiatric: He has a normal mood and affect. His behavior is normal. Judgment and thought content normal.  BP (!) 146/88   Pulse 90   Temp 98.4 F (36.9 C) (Oral)   Ht _0  (1.727 m)   Wt 188 lb 6.4 oz (85.5 kg)   SpO2 98%   BMI 28.65 kg/m      Assessment & Plan:  1. Benign paroxysmal positional vertigo, unspecified laterality - meclizine (ANTIVERT) 25 MG tablet; Take 1 tablet 3 (three) times  daily as needed for dizziness.  Dispense: 30 tablet; Refill: 2 - CMP14+EGFR  2. Essential hypertension - CMP14+EGFR  3. Peripheral vascular disease ([place]) - CMP14+EGFR  4. Uncomplicated asthma, unspecified asthma severity, unspecified whether persistent - CMP14+EGFR - budesonide-formoterol (SYMBICORT) 80-4.5 MCG/ACT inhaler; Inhale 2 puffs into the lungs 2 (two) times daily.  Dispense: 1 Inhaler; Refill: 3  5. Hyperlipidemia, unspecified hyperlipidemia type - CMP14+EGFR - Lipid panel  6. Bilateral low back pain, unspecified chronicity, with sciatica presence unspecified - CMP14+EGFR  7. Vitamin D deficiency - CMP14+EGFR  8. Overweight (BMI 25.0-29.9) - CMP14+EGFR   Continue all meds Labs pending Health Maintenance reviewed Diet and exercise encouraged RTO 4 months   Evelina Dun, FNP

## 2016-11-24 ENCOUNTER — Other Ambulatory Visit: Payer: Self-pay | Admitting: Family

## 2016-11-24 LAB — CMP14+EGFR
ALBUMIN: 4.5 g/dL (ref 3.5–4.7)
ALK PHOS: 47 IU/L (ref 39–117)
ALT: 8 IU/L (ref 0–44)
AST: 15 IU/L (ref 0–40)
Albumin/Globulin Ratio: 1.5 (ref 1.2–2.2)
BILIRUBIN TOTAL: 0.3 mg/dL (ref 0.0–1.2)
BUN / CREAT RATIO: 13 (ref 10–24)
BUN: 18 mg/dL (ref 8–27)
CHLORIDE: 105 mmol/L (ref 96–106)
CO2: 24 mmol/L (ref 20–29)
CREATININE: 1.37 mg/dL — AB (ref 0.76–1.27)
Calcium: 9.7 mg/dL (ref 8.6–10.2)
GFR calc non Af Amer: 48 mL/min/{1.73_m2} — ABNORMAL LOW (ref >59)
GFR, EST AFRICAN AMERICAN: 56 mL/min/{1.73_m2} — AB (ref >59)
GLUCOSE: 86 mg/dL (ref 65–99)
Globulin, Total: 3 g/dL (ref 1.5–4.5)
Potassium: 4.3 mmol/L (ref 3.5–5.2)
Sodium: 144 mmol/L (ref 134–144)
TOTAL PROTEIN: 7.5 g/dL (ref 6.0–8.5)

## 2016-11-24 LAB — LIPID PANEL
Chol/HDL Ratio: 4.4 ratio (ref 0.0–5.0)
Cholesterol, Total: 223 mg/dL — ABNORMAL HIGH (ref 100–199)
HDL: 51 mg/dL (ref >39)
LDL CALC: 150 mg/dL — AB (ref 0–99)
Triglycerides: 110 mg/dL (ref 0–149)
VLDL CHOLESTEROL CAL: 22 mg/dL (ref 5–40)

## 2016-11-24 MED ORDER — FENOFIBRATE 48 MG PO TABS: 48.0000 mg | ORAL_TABLET | Freq: Every day | ORAL | 1 refills | Status: DC

## 2016-11-24 MED ORDER — AMLODIPINE BESYLATE 10 MG PO TABS: 10.0000 mg | ORAL_TABLET | Freq: Every day | ORAL | 1 refills | Status: DC

## 2016-11-24 NOTE — Addendum Note (Signed)
Addended by: Shelbie Ammons on: 11/24/2016 09:25 AM   Modules accepted: Orders

## 2017-03-13 ENCOUNTER — Ambulatory Visit (INDEPENDENT_AMBULATORY_CARE_PROVIDER_SITE_OTHER): Payer: Medicare Other

## 2017-03-13 DIAGNOSIS — Z23 Encounter for immunization: Secondary | ICD-10-CM | POA: Diagnosis not present

## 2017-03-30 ENCOUNTER — Other Ambulatory Visit: Payer: Self-pay | Admitting: Family

## 2017-05-25 ENCOUNTER — Other Ambulatory Visit: Payer: Self-pay | Admitting: Family

## 2017-05-25 ENCOUNTER — Encounter: Payer: Self-pay | Admitting: Family

## 2017-05-25 ENCOUNTER — Ambulatory Visit: Payer: Medicare Other | Admitting: Family

## 2017-05-25 VITALS — BP 179/90 | HR 86 | Temp 97.5°F | Ht 68.0 in | Wt 187.6 lb

## 2017-05-25 DIAGNOSIS — E663 Overweight: Secondary | ICD-10-CM

## 2017-05-25 DIAGNOSIS — I739 Peripheral vascular disease, unspecified: Secondary | ICD-10-CM | POA: Diagnosis not present

## 2017-05-25 DIAGNOSIS — I10 Essential (primary) hypertension: Secondary | ICD-10-CM | POA: Diagnosis not present

## 2017-05-25 DIAGNOSIS — E785 Hyperlipidemia, unspecified: Secondary | ICD-10-CM

## 2017-05-25 DIAGNOSIS — E559 Vitamin D deficiency, unspecified: Secondary | ICD-10-CM | POA: Diagnosis not present

## 2017-05-25 DIAGNOSIS — I25810 Atherosclerosis of coronary artery bypass graft(s) without angina pectoris: Secondary | ICD-10-CM | POA: Diagnosis not present

## 2017-05-25 DIAGNOSIS — J45909 Unspecified asthma, uncomplicated: Secondary | ICD-10-CM

## 2017-05-25 MED ORDER — LOSARTAN POTASSIUM 100 MG PO TABS: 100.0000 mg | ORAL_TABLET | Freq: Every day | ORAL | 3 refills | Status: DC

## 2017-05-25 MED ORDER — LOSARTAN POTASSIUM 50 MG PO TABS: 50.0000 mg | ORAL_TABLET | Freq: Every day | ORAL | 3 refills | Status: DC

## 2017-05-25 MED ORDER — ATORVASTATIN CALCIUM 20 MG PO TABS: 20.0000 mg | ORAL_TABLET | Freq: Every day | ORAL | 11 refills | Status: DC

## 2017-05-25 NOTE — Patient Instructions (Signed)

## 2017-05-25 NOTE — Progress Notes (Signed)
Subjective:    Patient ID: ROI JAFARI, male    DOB: 18-Dec-1933, 83 y.o.   MRN: [id number]   Pt presents to the office today for chronic follow up. Pt is followed by Cardiologists 6 months for CAD, PVD, and mitral valve prolapse. Hypertension  This is a chronic problem. The current episode started more than 1 year ago. The problem is unchanged. The problem is uncontrolled. Pertinent negatives include no headaches, malaise/fatigue, palpitations, peripheral edema or shortness of breath. Risk factors for coronary artery disease include dyslipidemia, post-menopausal state and sedentary lifestyle. The current treatment provides mild improvement. Hypertensive end-organ damage includes CAD/MI. There is no history of kidney disease, CVA or heart failure.  Asthma  There is no cough, shortness of breath or wheezing. This is a chronic problem. The current episode started more than 1 year ago. The problem occurs intermittently. The problem has been waxing and waning. Pertinent negatives include no headaches or malaise/fatigue. His past medical history is significant for asthma.  Hyperlipidemia  This is a chronic problem. The current episode started more than 1 year ago. The problem is uncontrolled. Recent lipid tests were reviewed and are high. Pertinent negatives include no shortness of breath. Current antihyperlipidemic treatment includes bile acid squestrants. The current treatment provides no improvement of lipids. Risk factors for coronary artery disease include dyslipidemia, male sex, hypertension and a sedentary lifestyle.      Review of Systems  Constitutional: Negative for malaise/fatigue.  Respiratory: Negative for cough, shortness of breath and wheezing.   Cardiovascular: Negative for palpitations.  Neurological: Negative for headaches.  All other systems reviewed and are negative.      Objective:   Physical Exam  Constitutional: He is oriented to person, place, and time. He appears  well-developed and well-nourished. No distress.  HENT:  Head: Normocephalic.  Right Ear: External ear normal.  Left Ear: External ear normal.  Nose: Nose normal.  Mouth/Throat: Oropharynx is clear and moist.  Eyes: Pupils are equal, round, and reactive to light. Right eye exhibits no discharge. Left eye exhibits no discharge.  Neck: Normal range of motion. Neck supple. No thyromegaly present.  Cardiovascular: Normal rate, regular rhythm and intact distal pulses.  Murmur heard. Pulmonary/Chest: Effort normal and breath sounds normal. No respiratory distress. He has no wheezes.  Abdominal: Soft. Bowel sounds are normal. He exhibits no distension. There is no tenderness.  Musculoskeletal: Normal range of motion. He exhibits no edema or tenderness.  Neurological: He is alert and oriented to person, place, and time. He has normal reflexes. No cranial nerve deficit.  Skin: Skin is warm and dry. No rash noted. No erythema.  Psychiatric: He has a normal mood and affect. His behavior is normal. Judgment and thought content normal.  Vitals reviewed.     BP (!) 177/99   Pulse 95   Temp (!) 97.5 F (36.4 C) (Oral)   Ht _0  (1.727 m)   Wt 187 lb 9.6 oz (85.1 kg)   BMI 28.52 kg/m      Assessment & Plan:  1. Essential hypertension -Losartan 50 mg started today -Daily blood pressure log given with instructions on how to fill out and told to bring to next visit -Dash diet information given -Exercise encouraged - Stress Management  -Continue current meds -RTO in 2 weeks to recheck  - CMP14+EGFR - losartan (COZAAR) 50 MG tablet; Take 1 tablet (50 mg total) by mouth daily.  Dispense: 90 tablet; Refill: 3  2. Coronary artery disease involving  coronary bypass graft of native heart without angina pectoris - CMP14+EGFR - Lipid panel  3. Peripheral vascular disease ([place]) - CMP14+EGFR  4. Uncomplicated asthma, unspecified asthma severity, unspecified whether persistent - CMP14+EGFR  5.  Hyperlipidemia, unspecified hyperlipidemia type -Pt has intolerance to statins - CMP14+EGFR - Lipid panel  6. Overweight (BMI 25.0-29.9) - CMP14+EGFR  7. Vitamin D deficiency - CMP14+EGFR   Continue all meds Labs pending Health Maintenance reviewed Diet and exercise encouraged RTO 2 weeks to recheck BP  Evelina Dun, FNP

## 2017-05-26 ENCOUNTER — Other Ambulatory Visit: Payer: Self-pay | Admitting: *Deleted

## 2017-05-26 LAB — CMP14+EGFR
A/G RATIO: 1.4 (ref 1.2–2.2)
ALBUMIN: 4.5 g/dL (ref 3.5–4.7)
ALK PHOS: 55 IU/L (ref 39–117)
ALT: 11 IU/L (ref 0–44)
AST: 12 IU/L (ref 0–40)
BILIRUBIN TOTAL: 0.4 mg/dL (ref 0.0–1.2)
BUN / CREAT RATIO: 13 (ref 10–24)
BUN: 19 mg/dL (ref 8–27)
CHLORIDE: 108 mmol/L — AB (ref 96–106)
CO2: 25 mmol/L (ref 20–29)
Calcium: 10.1 mg/dL (ref 8.6–10.2)
Creatinine, Ser: 1.5 mg/dL — ABNORMAL HIGH (ref 0.76–1.27)
GFR calc Af Amer: 50 mL/min/{1.73_m2} — ABNORMAL LOW (ref >59)
GFR calc non Af Amer: 43 mL/min/{1.73_m2} — ABNORMAL LOW (ref >59)
GLOBULIN, TOTAL: 3.2 g/dL (ref 1.5–4.5)
Glucose: 93 mg/dL (ref 65–99)
POTASSIUM: 5.3 mmol/L — AB (ref 3.5–5.2)
SODIUM: 144 mmol/L (ref 134–144)
Total Protein: 7.7 g/dL (ref 6.0–8.5)

## 2017-05-26 LAB — LIPID PANEL
CHOLESTEROL TOTAL: 241 mg/dL — AB (ref 100–199)
Chol/HDL Ratio: 5 ratio (ref 0.0–5.0)
HDL: 48 mg/dL (ref >39)
LDL Calculated: 170 mg/dL — ABNORMAL HIGH (ref 0–99)
TRIGLYCERIDES: 114 mg/dL (ref 0–149)
VLDL Cholesterol Cal: 23 mg/dL (ref 5–40)

## 2017-05-28 ENCOUNTER — Ambulatory Visit (INDEPENDENT_AMBULATORY_CARE_PROVIDER_SITE_OTHER): Payer: Medicare Other | Admitting: *Deleted

## 2017-05-28 ENCOUNTER — Encounter: Payer: Self-pay | Admitting: *Deleted

## 2017-05-28 VITALS — BP 136/75 | HR 83 | Ht 66.5 in | Wt 190.0 lb

## 2017-05-28 DIAGNOSIS — Z0000 Encounter for general adult medical examination without abnormal findings: Secondary | ICD-10-CM | POA: Diagnosis not present

## 2017-05-28 NOTE — Patient Instructions (Signed)
  [name] , Thank you for taking time to come for your Medicare Wellness Visit. I appreciate your ongoing commitment to your health goals. Please review the following plan we discussed and let me know if I can assist you in the future.   These are the goals we discussed: Continue to stay active for at least 150 minutes a week.  This is a list of the screening recommended for you and due dates:  Health Maintenance  Topic Date Due  . Tetanus Vaccine  05/28/2018*  . Flu Shot  Completed  . Pneumonia vaccines  Completed  *Topic was postponed. The date shown is not the original due date.   Bring all of your medications to your next visit with Jared Dun, FNP
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
## 2017-06-19 ENCOUNTER — Ambulatory Visit: Payer: Medicare Other | Admitting: Family

## 2017-06-19 ENCOUNTER — Encounter: Payer: Self-pay | Admitting: Family

## 2017-06-19 VITALS — BP 134/82 | HR 83 | Temp 97.0°F | Ht 66.5 in | Wt 189.6 lb

## 2017-06-19 DIAGNOSIS — I10 Essential (primary) hypertension: Secondary | ICD-10-CM | POA: Diagnosis not present

## 2017-06-19 NOTE — Patient Instructions (Addendum)

## 2017-06-19 NOTE — Progress Notes (Signed)
   Subjective:    Patient ID: Jared Cruz, male    DOB: [birth year], 84 y.o.   MRN: [id number]  Pt presents to the office today to recheck HTN. Pt's BP is at goal today! Hypertension  This is a chronic problem. The current episode started more than 1 year ago. The problem has been resolved since onset. The problem is controlled. Pertinent negatives include no headaches, malaise/fatigue, peripheral edema or shortness of breath. Risk factors for coronary artery disease include dyslipidemia. The current treatment provides moderate improvement. Hypertensive end-organ damage includes CAD/MI. There is no history of kidney disease, CVA or heart failure.      Review of Systems  Constitutional: Negative for malaise/fatigue.  Respiratory: Negative for shortness of breath.   Neurological: Negative for headaches.  All other systems reviewed and are negative.      Objective:   Physical Exam  Constitutional: He is oriented to person, place, and time. He appears well-developed and well-nourished. No distress.  HENT:  Head: Normocephalic.  Right Ear: External ear normal.  Left Ear: External ear normal.  Nose: Nose normal.  Mouth/Throat: Oropharynx is clear and moist.  Eyes: Pupils are equal, round, and reactive to light. Right eye exhibits no discharge. Left eye exhibits no discharge.  Neck: Normal range of motion. Neck supple. No thyromegaly present.  Cardiovascular: Normal rate, regular rhythm, normal heart sounds and intact distal pulses.  No murmur heard. Pulmonary/Chest: Effort normal and breath sounds normal. No respiratory distress. He has no wheezes.  Abdominal: Soft. Bowel sounds are normal. He exhibits no distension. There is no tenderness.  Musculoskeletal: Normal range of motion. He exhibits no edema or tenderness.  Neurological: He is alert and oriented to person, place, and time.  Skin: Skin is warm and dry. No rash noted. No erythema.  Psychiatric: He has a normal mood and  affect. His behavior is normal. Judgment and thought content normal.  Vitals reviewed.    BP 134/82   Pulse 83   Temp (!) 97 F (36.1 C) (Oral)   Ht 5' 6.5" (1.689 m)   Wt 189 lb 9.6 oz (86 kg)   BMI 30.14 kg/m      Assessment & Plan:  1. Essential hypertension -Dash diet information given -Exercise encouraged - Stress Management  -Continue current meds -RTO in 4 months  - BMP8+EGFR    Evelina Dun, FNP

## 2017-06-20 LAB — BMP8+EGFR
BUN/Creatinine Ratio: 11 (ref 10–24)
BUN: 16 mg/dL (ref 8–27)
CALCIUM: 10.2 mg/dL (ref 8.6–10.2)
CHLORIDE: 105 mmol/L (ref 96–106)
CO2: 23 mmol/L (ref 20–29)
Creatinine, Ser: 1.5 mg/dL — ABNORMAL HIGH (ref 0.76–1.27)
GFR calc Af Amer: 50 mL/min/{1.73_m2} — ABNORMAL LOW (ref >59)
GFR, EST NON AFRICAN AMERICAN: 43 mL/min/{1.73_m2} — AB (ref >59)
Glucose: 97 mg/dL (ref 65–99)
Potassium: 4.9 mmol/L (ref 3.5–5.2)
Sodium: 143 mmol/L (ref 134–144)

## 2017-09-24 ENCOUNTER — Other Ambulatory Visit: Payer: Self-pay | Admitting: Family

## 2017-09-24 DIAGNOSIS — H8110 Benign paroxysmal vertigo, unspecified ear: Secondary | ICD-10-CM

## 2017-10-17 ENCOUNTER — Other Ambulatory Visit: Payer: Self-pay | Admitting: Family

## 2017-10-22 ENCOUNTER — Ambulatory Visit: Payer: Medicare Other | Admitting: Family

## 2017-10-22 ENCOUNTER — Encounter: Payer: Self-pay | Admitting: Family

## 2017-10-22 VITALS — BP 137/82 | HR 81 | Temp 97.9°F | Ht 66.5 in | Wt 190.8 lb

## 2017-10-22 DIAGNOSIS — J45909 Unspecified asthma, uncomplicated: Secondary | ICD-10-CM | POA: Diagnosis not present

## 2017-10-22 DIAGNOSIS — I25810 Atherosclerosis of coronary artery bypass graft(s) without angina pectoris: Secondary | ICD-10-CM

## 2017-10-22 DIAGNOSIS — I10 Essential (primary) hypertension: Secondary | ICD-10-CM

## 2017-10-22 DIAGNOSIS — E785 Hyperlipidemia, unspecified: Secondary | ICD-10-CM | POA: Diagnosis not present

## 2017-10-22 DIAGNOSIS — H8110 Benign paroxysmal vertigo, unspecified ear: Secondary | ICD-10-CM

## 2017-10-22 LAB — CMP14+EGFR
A/G RATIO: 1.6 (ref 1.2–2.2)
ALK PHOS: 56 IU/L (ref 39–117)
ALT: 9 IU/L (ref 0–44)
AST: 16 IU/L (ref 0–40)
Albumin: 4.6 g/dL (ref 3.5–4.7)
BUN/Creatinine Ratio: 10 (ref 10–24)
BUN: 15 mg/dL (ref 8–27)
Bilirubin Total: 0.5 mg/dL (ref 0.0–1.2)
CALCIUM: 10 mg/dL (ref 8.6–10.2)
CO2: 23 mmol/L (ref 20–29)
CREATININE: 1.49 mg/dL — AB (ref 0.76–1.27)
Chloride: 104 mmol/L (ref 96–106)
GFR calc Af Amer: 50 mL/min/{1.73_m2} — ABNORMAL LOW (ref >59)
GFR calc non Af Amer: 43 mL/min/{1.73_m2} — ABNORMAL LOW (ref >59)
Globulin, Total: 2.8 g/dL (ref 1.5–4.5)
Glucose: 97 mg/dL (ref 65–99)
POTASSIUM: 4.9 mmol/L (ref 3.5–5.2)
Sodium: 144 mmol/L (ref 134–144)
Total Protein: 7.4 g/dL (ref 6.0–8.5)

## 2017-10-22 MED ORDER — MECLIZINE HCL 25 MG PO TABS: 25.0000 mg | ORAL_TABLET | Freq: Three times a day (TID) | ORAL | 1 refills | Status: DC | PRN

## 2017-10-22 NOTE — Patient Instructions (Signed)
Dizziness °Dizziness is a common problem. It is a feeling of unsteadiness or light-headedness. You may feel like you are about to faint. Dizziness can lead to injury if you stumble or fall. Anyone can become dizzy, but dizziness is more common in older adults. This condition can be caused by a number of things, including medicines, dehydration, or illness. °Follow these instructions at home: °Eating and drinking °· Drink enough fluid to keep your urine clear or pale yellow. This helps to keep you from becoming dehydrated. Try to drink more clear fluids, such as water. °· Do not drink alcohol. °· Limit your caffeine intake if told to do so by your health care provider. Check ingredients and nutrition facts to see if a food or beverage contains caffeine. °· Limit your salt (sodium) intake if told to do so by your health care provider. Check ingredients and nutrition facts to see if a food or beverage contains sodium. °Activity °· Avoid making quick movements. °? Rise slowly from chairs and steady yourself until you feel okay. °? In the morning, first sit up on the side of the bed. When you feel okay, stand slowly while you hold onto something until you know that your balance is fine. °· If you need to stand in one place for a long time, move your legs often. Tighten and relax the muscles in your legs while you are standing. °· Do not drive or use heavy machinery if you feel dizzy. °· Avoid bending down if you feel dizzy. Place items in your home so that they are easy for you to reach without leaning over. °Lifestyle °· Do not use any products that contain nicotine or tobacco, such as cigarettes and e-cigarettes. If you need help quitting, ask your health care provider. °· Try to reduce your stress level by using methods such as yoga or meditation. Talk with your health care provider if you need help to manage your stress. °General instructions °· Watch your dizziness for any changes. °· Take over-the-counter and  prescription medicines only as told by your health care provider. Talk with your health care provider if you think that your dizziness is caused by a medicine that you are taking. °· Tell a friend or a family member that you are feeling dizzy. If he or she notices any changes in your behavior, have this person call your health care provider. °· Keep all follow-up visits as told by your health care provider. This is important. °Contact a health care provider if: °· Your dizziness does not go away. °· Your dizziness or light-headedness gets worse. °· You feel nauseous. °· You have reduced hearing. °· You have new symptoms. °· You are unsteady on your feet or you feel like the room is spinning. °Get help right away if: °· You vomit or have diarrhea and are unable to eat or drink anything. °· You have problems talking, walking, swallowing, or using your arms, hands, or legs. °· You feel generally weak. °· You are not thinking clearly or you have trouble forming sentences. It may take a friend or family member to notice this. °· You have chest pain, abdominal pain, shortness of breath, or sweating. °· Your vision changes. °· You have any bleeding. °· You have a severe headache. °· You have neck pain or a stiff neck. °· You have a fever. °These symptoms may represent a serious problem that is an emergency. Do not wait to see if the symptoms will go away. Get medical help   right away. Call your local emergency services (911 in the [place] Do not drive yourself to the hospital. °Summary °· Dizziness is a feeling of unsteadiness or light-headedness. This condition can be caused by a number of things, including medicines, dehydration, or illness. °· Anyone can become dizzy, but dizziness is more common in older adults. °· Drink enough fluid to keep your urine clear or pale yellow. Do not drink alcohol. °· Avoid making quick movements if you feel dizzy. Monitor your dizziness for any changes. °This information is not intended to  replace advice given to you by your health care provider. Make sure you discuss any questions you have with your health care provider. °Document Released: 11/22/2000 Document Revised: 07/01/2016 Document Reviewed: 07/01/2016 °Elsevier Interactive Patient Education © 2018 [employer] ° °

## 2017-10-22 NOTE — Progress Notes (Signed)
Subjective:    Patient ID: Jared Cruz, male    DOB: Oct 17, 1933, 84 y.o.   MRN: [id number]  Chief Complaint  Patient presents with  . Hypertension    4 month recheck   Pt presents to the office today for chronic follow up.Pt is followed by Cardiologists 6 months for CAD, PVD, and mitral valve prolapse. He reports he has not taken any of his medications except his albuertol inhaler for over the last week.  He reports every time he took his Norvasc 10 mg it was get "stuck" in his throat. States the "pill has a sticking coating when it gets wet, and will get hung in my throat". States this got him "strangled and scared" and has not taken any more since.   Hypertension  This is a chronic problem. The current episode started more than 1 year ago. The problem has been resolved since onset. The problem is controlled. Pertinent negatives include no headaches, peripheral edema or shortness of breath. Risk factors for coronary artery disease include dyslipidemia, family history and male gender. The current treatment provides moderate improvement. There is no history of kidney disease, CAD/MI or heart failure.  Hyperlipidemia  This is a chronic problem. The current episode started more than 1 year ago. The problem is uncontrolled. Recent lipid tests were reviewed and are high. Pertinent negatives include no shortness of breath. Current antihyperlipidemic treatment includes fibric acid derivatives. The current treatment provides mild improvement of lipids. Compliance problems include medication side effects.   Asthma  He complains of cough and wheezing. There is no shortness of breath. This is a chronic problem. Pertinent negatives include no headaches. His symptoms are alleviated by leukotriene antagonist. He reports moderate improvement on treatment. His past medical history is significant for asthma.  Dizziness  This is a chronic problem. The current episode started more than 1 year ago. The problem  occurs intermittently. The problem has been waxing and waning. Associated symptoms include coughing. Pertinent negatives include no headaches. The symptoms are aggravated by bending. Treatments tried: antivert. The treatment provided mild relief.      Review of Systems  Respiratory: Positive for cough and wheezing. Negative for shortness of breath.   Neurological: Positive for dizziness. Negative for headaches.  All other systems reviewed and are negative.      Objective:   Physical Exam  Constitutional: He is oriented to person, place, and time. He appears well-developed and well-nourished. No distress.  HENT:  Head: Normocephalic.  Right Ear: External ear normal.  Left Ear: External ear normal.  Mouth/Throat: Oropharynx is clear and moist.  Eyes: Pupils are equal, round, and reactive to light. Right eye exhibits no discharge. Left eye exhibits no discharge.  Neck: Normal range of motion. Neck supple. No thyromegaly present.  Cardiovascular: Normal rate, regular rhythm and intact distal pulses.  Murmur heard. Pulmonary/Chest: Effort normal and breath sounds normal. No respiratory distress. He has no wheezes.  Abdominal: Soft. Bowel sounds are normal. He exhibits no distension. There is no tenderness.  Musculoskeletal: Normal range of motion. He exhibits no edema or tenderness.  Neurological: He is alert and oriented to person, place, and time. He has normal reflexes. No cranial nerve deficit.  Skin: Skin is warm and dry. No rash noted. No erythema.  Psychiatric: He has a normal mood and affect. His behavior is normal. Judgment and thought content normal.  Vitals reviewed.     BP 137/82   Pulse 81   Temp 97.9 F (36.6  C) (Oral)   Ht 5' 6.5" (1.689 m)   Wt 190 lb 12.8 oz (86.5 kg)   BMI 30.33 kg/m      Assessment & Plan:  Jared Cruz comes in today with chief complaint of Hypertension (4 month recheck)   Diagnosis and orders addressed:  1. Essential  hypertension -Will stop Norvasc and hold Losartan today. Pt has not had either of these medications over the last week and his BP is at goal. He had report his dizziness has become worse and I wonder if this related to hypotension. He reports his BP at home this AM was 117/79. Discussed he will monitor his BP at home and if starts to increase we will add losartan back. - CMP14+EGFR  2. Coronary artery disease involving coronary bypass graft of native heart without angina pectoris - CMP14+EGFR  3. Uncomplicated asthma, unspecified asthma severity, unspecified whether persistent - CMP14+EGFR  4. Benign paroxysmal positional vertigo, unspecified laterality - meclizine (ANTIVERT) 25 MG tablet; Take 1 tablet (25 mg total) by mouth 3 (three) times daily as needed for dizziness. (Patient not taking: Reported on 10/22/2017)  Dispense: 90 tablet; Refill: 1 - CMP14+EGFR  5. Hyperlipidemia, unspecified hyperlipidemia type - CMP14+EGFR   Labs pending Health Maintenance reviewed Diet and exercise encouraged  Follow up plan: 1 month to recheck HTN   Evelina Dun, FNP

## 2017-11-22 ENCOUNTER — Encounter: Payer: Self-pay | Admitting: Family

## 2017-11-22 ENCOUNTER — Ambulatory Visit: Payer: Medicare Other | Admitting: Family

## 2017-11-22 DIAGNOSIS — I10 Essential (primary) hypertension: Secondary | ICD-10-CM

## 2017-11-22 LAB — BMP8+EGFR
BUN/Creatinine Ratio: 10 (ref 10–24)
BUN: 14 mg/dL (ref 8–27)
CALCIUM: 10.1 mg/dL (ref 8.6–10.2)
CO2: 23 mmol/L (ref 20–29)
Chloride: 105 mmol/L (ref 96–106)
Creatinine, Ser: 1.4 mg/dL — ABNORMAL HIGH (ref 0.76–1.27)
GFR calc Af Amer: 54 mL/min/{1.73_m2} — ABNORMAL LOW (ref >59)
GFR, EST NON AFRICAN AMERICAN: 47 mL/min/{1.73_m2} — AB (ref >59)
Glucose: 85 mg/dL (ref 65–99)
Potassium: 5.2 mmol/L (ref 3.5–5.2)
Sodium: 143 mmol/L (ref 134–144)

## 2017-11-22 MED ORDER — LOSARTAN POTASSIUM 50 MG PO TABS: 50.0000 mg | ORAL_TABLET | Freq: Every day | ORAL | 3 refills | Status: DC

## 2017-11-22 NOTE — Progress Notes (Signed)
   Subjective:    Patient ID: Jared Cruz, male    DOB: 1934-03-01, 84 y.o.   MRN: [id number]  Chief Complaint  Patient presents with  . Hypertension    one month recheck   PT states he has not taken any of losartan or norvasc in over a month. He reports he takes his BP at home and his machine reads /80's. Pt's BP is elevated today.   Hypertension  This is a chronic problem. The current episode started more than 1 year ago. The problem has been waxing and waning since onset. Pertinent negatives include no headaches, peripheral edema or shortness of breath. Risk factors for coronary artery disease include dyslipidemia, obesity, male gender, sedentary lifestyle and family history. The current treatment provides mild improvement. Hypertensive end-organ damage includes CAD/MI.      Review of Systems  Respiratory: Negative for shortness of breath.   Neurological: Negative for headaches.  All other systems reviewed and are negative.      Objective:   Physical Exam  Constitutional: He is oriented to person, place, and time. He appears well-developed and well-nourished. No distress.  HENT:  Head: Normocephalic.  Eyes: Pupils are equal, round, and reactive to light. Right eye exhibits no discharge. Left eye exhibits no discharge.  Neck: Normal range of motion. Neck supple. No thyromegaly present.  Cardiovascular: Normal rate, regular rhythm, normal heart sounds and intact distal pulses.  No murmur heard. Pulmonary/Chest: Effort normal and breath sounds normal. No respiratory distress. He has no wheezes.  Abdominal: Soft. Bowel sounds are normal. He exhibits no distension. There is no tenderness.  Musculoskeletal: Normal range of motion. He exhibits no edema or tenderness.  Neurological: He is alert and oriented to person, place, and time. He has normal reflexes. No cranial nerve deficit.  Skin: Skin is warm and dry. No rash noted. No erythema.  Psychiatric: He has a normal mood and  affect. His behavior is normal. Judgment and thought content normal.  Vitals reviewed.     BP (!) 157/92   Pulse 75   Temp (!) 97.5 F (36.4 C) (Oral)   Ht 5' 6.5" (1.689 m)   Wt 191 lb 9.6 oz (86.9 kg)   BMI 30.46 kg/m      Assessment & Plan:  RAHEIM BEUTLER comes in today with chief complaint of Hypertension (one month recheck)   Diagnosis and orders addressed:  1. Essential hypertension We will restart Losartan today Pt to bring his BP machine on next visit -Dash diet information given -Exercise encouraged - Stress Management  -Continue current meds -RTO in 2 weeks  - losartan (COZAAR) 50 MG tablet; Take 1 tablet (50 mg total) by mouth daily.  Dispense: 90 tablet; Refill: 3 - BMP8+EGFR   Labs pending Health Maintenance reviewed Diet and exercise encouraged  Follow up plan: 2 weeks to recheck HTN  Evelina Dun, FNP

## 2017-11-22 NOTE — Patient Instructions (Signed)

## 2017-12-06 ENCOUNTER — Ambulatory Visit: Payer: Medicare Other | Admitting: Family

## 2017-12-06 ENCOUNTER — Encounter: Payer: Self-pay | Admitting: Family

## 2017-12-06 VITALS — BP 131/74 | HR 81 | Temp 98.5°F | Ht 66.5 in | Wt 188.2 lb

## 2017-12-06 DIAGNOSIS — I10 Essential (primary) hypertension: Secondary | ICD-10-CM | POA: Diagnosis not present

## 2017-12-06 DIAGNOSIS — Z23 Encounter for immunization: Secondary | ICD-10-CM

## 2017-12-06 LAB — BMP8+EGFR
BUN/Creatinine Ratio: 9 — ABNORMAL LOW (ref 10–24)
BUN: 15 mg/dL (ref 8–27)
CALCIUM: 10.2 mg/dL (ref 8.6–10.2)
CO2: 23 mmol/L (ref 20–29)
Chloride: 107 mmol/L — ABNORMAL HIGH (ref 96–106)
Creatinine, Ser: 1.7 mg/dL — ABNORMAL HIGH (ref 0.76–1.27)
GFR calc Af Amer: 43 mL/min/{1.73_m2} — ABNORMAL LOW (ref >59)
GFR calc non Af Amer: 37 mL/min/{1.73_m2} — ABNORMAL LOW (ref >59)
Glucose: 95 mg/dL (ref 65–99)
POTASSIUM: 5.1 mmol/L (ref 3.5–5.2)
Sodium: 144 mmol/L (ref 134–144)

## 2017-12-06 NOTE — Addendum Note (Signed)
Addended by: Shelbie Ammons on: 12/06/2017 10:03 AM   Modules accepted: Orders

## 2017-12-06 NOTE — Patient Instructions (Signed)

## 2017-12-06 NOTE — Progress Notes (Signed)
   Subjective:    Patient ID: Jared Cruz, male    DOB: 1933-11-15, 84 y.o.   MRN: [id number]  Chief Complaint  Patient presents with  . Hypertension    two week recheck   Pt presents to the office today to recheck HTN. Pt's BP is at goal today.  Hypertension  This is a chronic problem. The current episode started more than 1 year ago. The problem has been resolved since onset. The problem is controlled. Pertinent negatives include no headaches, malaise/fatigue, peripheral edema or shortness of breath. Risk factors for coronary artery disease include dyslipidemia and male gender. Past treatments include angiotensin blockers. Hypertensive end-organ damage includes CAD/MI. There is no history of kidney disease.      Review of Systems  Constitutional: Negative for malaise/fatigue.  Respiratory: Negative for shortness of breath.   Neurological: Negative for headaches.  All other systems reviewed and are negative.      Objective:   Physical Exam  Constitutional: He is oriented to person, place, and time. He appears well-developed and well-nourished. No distress.  HENT:  Head: Normocephalic.  Right Ear: External ear normal.  Left Ear: External ear normal.  Mouth/Throat: Oropharynx is clear and moist.  Eyes: Pupils are equal, round, and reactive to light. Right eye exhibits no discharge. Left eye exhibits no discharge.  Neck: Normal range of motion. Neck supple. No thyromegaly present.  Cardiovascular: Normal rate, regular rhythm, normal heart sounds and intact distal pulses.  No murmur heard. Pulmonary/Chest: Effort normal and breath sounds normal. No respiratory distress. He has no wheezes.  Abdominal: Soft. Bowel sounds are normal. He exhibits no distension. There is no tenderness.  Musculoskeletal: Normal range of motion. He exhibits no edema or tenderness.  Neurological: He is alert and oriented to person, place, and time. He has normal reflexes. No cranial nerve deficit.    Skin: Skin is warm and dry. No rash noted. No erythema.  Psychiatric: He has a normal mood and affect. His behavior is normal. Judgment and thought content normal.  Vitals reviewed.     BP 131/74   Pulse 81   Temp 98.5 F (36.9 C) (Oral)   Ht 5' 6.5" (1.689 m)   Wt 188 lb 3.2 oz (85.4 kg)   BMI 29.92 kg/m      Assessment & Plan:  Jared Cruz comes in today with chief complaint of Hypertension (two week recheck)   Diagnosis and orders addressed:  1. Essential hypertension -Daily blood pressure log given with instructions on how to fill out and told to bring to next visit -Dash diet information given -Exercise encouraged - Stress Management  -Continue current meds -RTO in 6 months  - BMP8+EGFR TDAP given today  Evelina Dun, FNP

## 2018-01-21 ENCOUNTER — Other Ambulatory Visit: Payer: Self-pay | Admitting: Family

## 2018-01-21 NOTE — Telephone Encounter (Signed)
Last seen 6/19  Last lipid 12/18

## 2018-02-21 DIAGNOSIS — H40033 Anatomical narrow angle, bilateral: Secondary | ICD-10-CM | POA: Diagnosis not present

## 2018-02-21 DIAGNOSIS — H2513 Age-related nuclear cataract, bilateral: Secondary | ICD-10-CM | POA: Diagnosis not present

## 2018-03-08 ENCOUNTER — Ambulatory Visit (INDEPENDENT_AMBULATORY_CARE_PROVIDER_SITE_OTHER): Payer: Medicare Other

## 2018-03-08 DIAGNOSIS — Z23 Encounter for immunization: Secondary | ICD-10-CM

## 2018-03-26 ENCOUNTER — Encounter: Payer: Self-pay | Admitting: *Deleted

## 2018-04-18 ENCOUNTER — Other Ambulatory Visit: Payer: Self-pay | Admitting: Family

## 2018-05-17 ENCOUNTER — Other Ambulatory Visit: Payer: Self-pay | Admitting: Family

## 2018-05-29 ENCOUNTER — Encounter: Payer: Medicare Other | Admitting: *Deleted

## 2018-05-30 ENCOUNTER — Encounter: Payer: Medicare Other | Admitting: *Deleted

## 2018-05-30 ENCOUNTER — Ambulatory Visit (INDEPENDENT_AMBULATORY_CARE_PROVIDER_SITE_OTHER): Payer: Medicare Other | Admitting: *Deleted

## 2018-05-30 ENCOUNTER — Encounter: Payer: Self-pay | Admitting: *Deleted

## 2018-05-30 VITALS — BP 168/84 | HR 85 | Ht 66.5 in | Wt 190.0 lb

## 2018-05-30 DIAGNOSIS — Z23 Encounter for immunization: Secondary | ICD-10-CM

## 2018-05-30 DIAGNOSIS — Z1211 Encounter for screening for malignant neoplasm of colon: Secondary | ICD-10-CM

## 2018-05-30 DIAGNOSIS — Z0000 Encounter for general adult medical examination without abnormal findings: Secondary | ICD-10-CM

## 2018-05-30 DIAGNOSIS — Z1212 Encounter for screening for malignant neoplasm of rectum: Secondary | ICD-10-CM

## 2018-05-30 NOTE — Patient Instructions (Addendum)
Please work on your goal of continuing your healthy diet and exercise habits.   Please review the information given on Advance Directives.  If you complete the paperwork, please bring a copy to our office to be filed in your medical record.   Please follow up with Evelina Dun, FNP as scheduled.  You received your 1st Shingrix (Shingles) Vaccine today.  You will need the 2nd dose after 07/31/18.   Thank you for coming in for your Annual Wellness Visit today!!  Merry Christmas!!  Preventive Care 82 Years and Older, Male Preventive care refers to lifestyle choices and visits with your health care provider that can promote health and wellness. What does preventive care include?   A yearly physical exam. This is also called an annual well check.  Dental exams once or twice a year.  Routine eye exams. Ask your health care provider how often you should have your eyes checked.  Personal lifestyle choices, including: ? Daily care of your teeth and gums. ? Regular physical activity. ? Eating a healthy diet. ? Avoiding tobacco and drug use. ? Limiting alcohol use. ? Practicing safe sex. ? Taking low doses of aspirin every day. ? Taking vitamin and mineral supplements as recommended by your health care provider. What happens during an annual well check? The services and screenings done by your health care provider during your annual well check will depend on your age, overall health, lifestyle risk factors, and family history of disease. Counseling Your health care provider may ask you questions about your:  Alcohol use.  Tobacco use.  Drug use.  Emotional well-being.  Home and relationship well-being.  Sexual activity.  Eating habits.  History of falls.  Memory and ability to understand (cognition).  Work and work Statistician. Screening You may have the following tests or measurements:  Height, weight, and BMI.  Blood pressure.  Lipid and cholesterol levels. These  may be checked every 5 years, or more frequently if you are over 82 years old.  Skin check.  Lung cancer screening. You may have this screening every year starting at age 82 if you have a 30-pack-year history of smoking and currently smoke or have quit within the past 15 years.  Colorectal cancer screening. All adults should have this screening starting at age 82 and continuing until age 82. You will have tests every 1-10 years, depending on your results and the type of screening test. People at increased risk should start screening at an earlier age. Screening tests may include: ? Guaiac-based fecal occult blood testing. ? Fecal immunochemical test (FIT). ? Stool DNA test. ? Virtual colonoscopy. ? Sigmoidoscopy. During this test, a flexible tube with a tiny camera (sigmoidoscope) is used to examine your rectum and lower colon. The sigmoidoscope is inserted through your anus into your rectum and lower colon. ? Colonoscopy. During this test, a long, thin, flexible tube with a tiny camera (colonoscope) is used to examine your entire colon and rectum.  Prostate cancer screening. Recommendations will vary depending on your family history and other risks.  Hepatitis C blood test.  Hepatitis B blood test.  Sexually transmitted disease (STD) testing.  Diabetes screening. This is done by checking your blood sugar (glucose) after you have not eaten for a while (fasting). You may have this done every 1-3 years.  Abdominal aortic aneurysm (AAA) screening. You may need this if you are a current or former smoker.  Osteoporosis. You may be screened starting at age 82 if you are at  high risk. Talk with your health care provider about your test results, treatment options, and if necessary, the need for more tests. Vaccines Your health care provider may recommend certain vaccines, such as:  Influenza vaccine. This is recommended every year.  Tetanus, diphtheria, and acellular pertussis (Tdap, Td)  vaccine. You may need a Td booster every 10 years.  Varicella vaccine. You may need this if you have not been vaccinated.  Zoster vaccine. You may need this after age 82.  Measles, mumps, and rubella (MMR) vaccine. You may need at least one dose of MMR if you were born in 1957 or later. You may also need a second dose.  Pneumococcal 13-valent conjugate (PCV13) vaccine. One dose is recommended after age 82.  Pneumococcal polysaccharide (PPSV23) vaccine. One dose is recommended after age 82.  Meningococcal vaccine. You may need this if you have certain conditions.  Hepatitis A vaccine. You may need this if you have certain conditions or if you travel or work in places where you may be exposed to hepatitis A.  Hepatitis B vaccine. You may need this if you have certain conditions or if you travel or work in places where you may be exposed to hepatitis B.  Haemophilus influenzae type b (Hib) vaccine. You may need this if you have certain risk factors. Talk to your health care provider about which screenings and vaccines you need and how often you need them. This information is not intended to replace advice given to you by your health care provider. Make sure you discuss any questions you have with your health care provider. Document Released: 06/25/2015 Document Revised: 07/19/2017 Document Reviewed: 03/30/2015 Elsevier Interactive Patient Education  2019 [employer].
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
## 2018-06-10 ENCOUNTER — Ambulatory Visit: Payer: Medicare Other | Admitting: Family

## 2018-06-13 DIAGNOSIS — Z1211 Encounter for screening for malignant neoplasm of colon: Secondary | ICD-10-CM | POA: Diagnosis not present

## 2018-06-13 DIAGNOSIS — Z1212 Encounter for screening for malignant neoplasm of rectum: Secondary | ICD-10-CM | POA: Diagnosis not present

## 2018-06-14 LAB — COLOGUARD: Cologuard: NEGATIVE

## 2018-06-16 IMAGING — CT CT HEAD W/O CM
4 of 9 series · 16 of 47 positions shown, 18 images · non-contrast
Comparison: None.

CLINICAL DATA: Fell onto his face hitting nightstand and now has
swelling to his left lower jaw as well as his lips- he has a small
cit to his inner lip. Patient has no other complaints besides upper
lip swelling.

EXAM:
CT HEAD WITHOUT CONTRAST
CT MAXILLOFACIAL WITHOUT CONTRAST
TECHNIQUE: Multidetector CT imaging of the head and maxillofacial structures
were performed using the standard protocol without intravenous
contrast. Multiplanar CT image reconstructions of the maxillofacial
structures were also generated.

[Series 7: max soft · axial · 0.30mm/px · z∈[+1645,+1779]mm · 8 of 87 slices shown, 10 images (1 of 2)]
[im 10/87  brain]
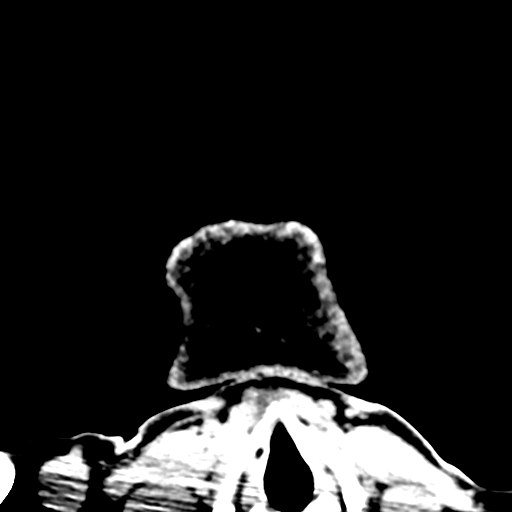
[im 10/87  bone]
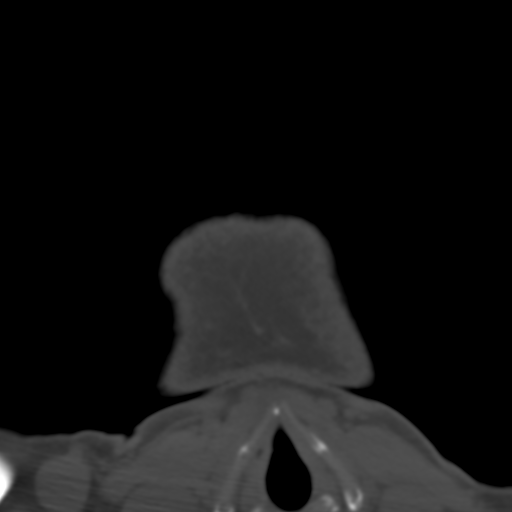
[im 20/87  brain]
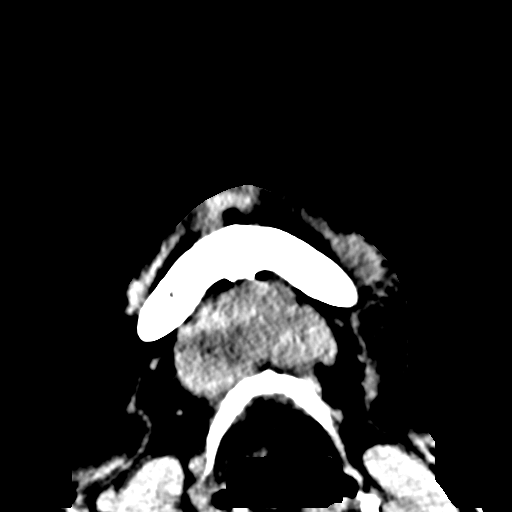
[im 29/87  brain]
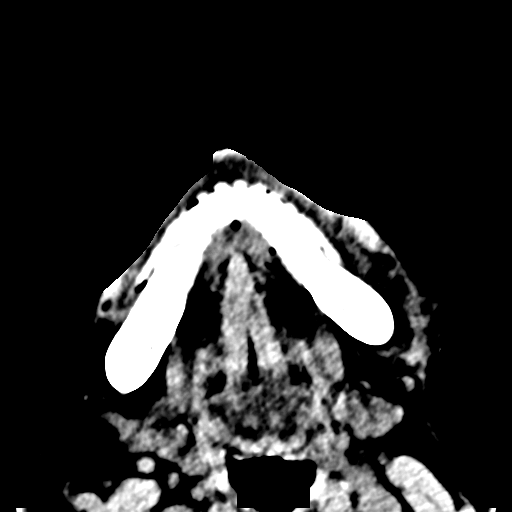
[im 39/87  brain]
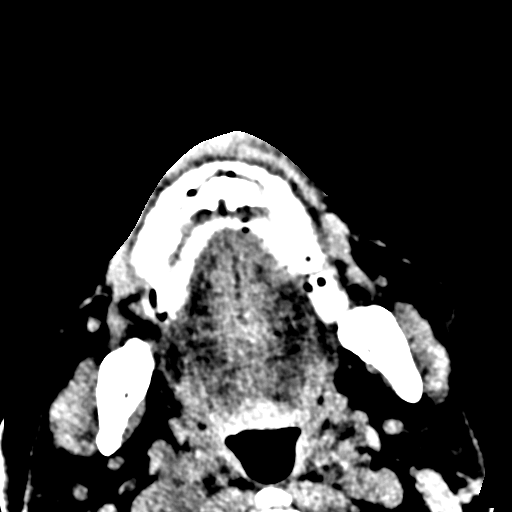
[im 48/87  brain]
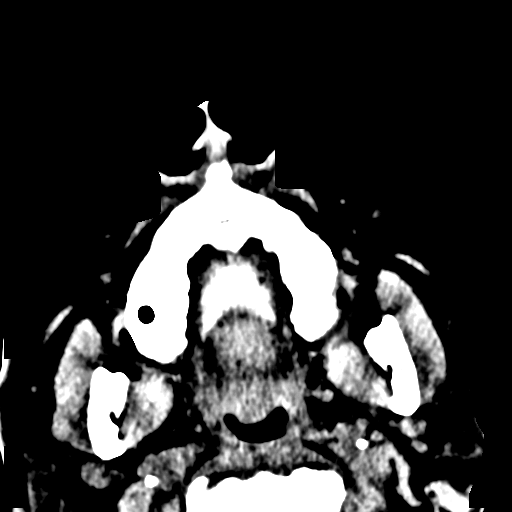
[im 48/87  bone]
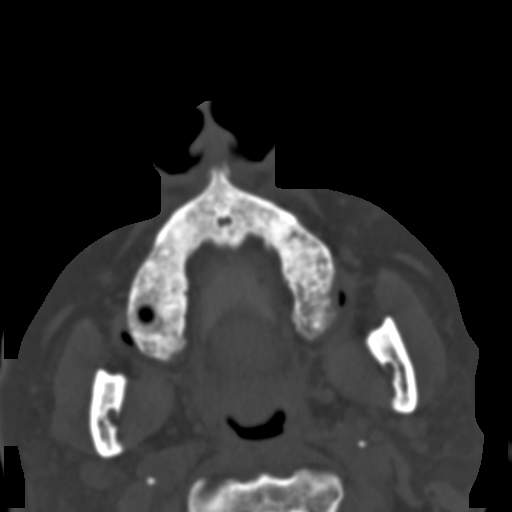
[im 58/87  brain]
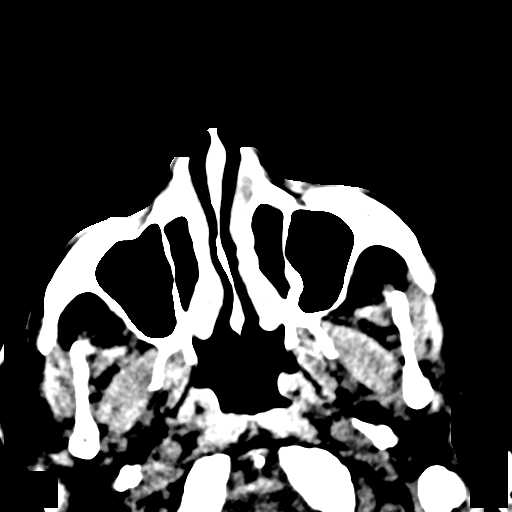
[im 67/87  brain]
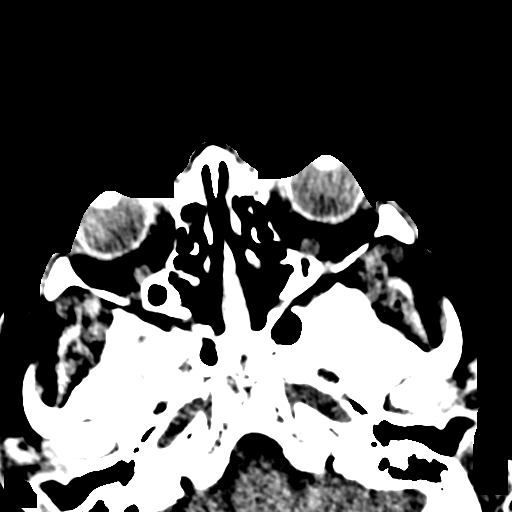
[im 77/87  brain]
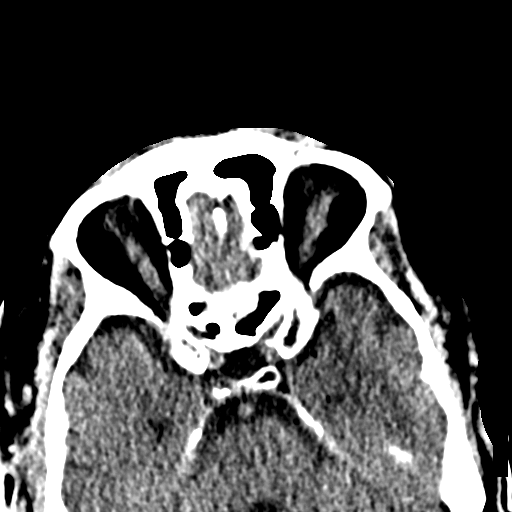

[Series 11: coronal soft · coronal · 0.31mm/px · 2 of 86 slices shown]
[im 29/86  brain]
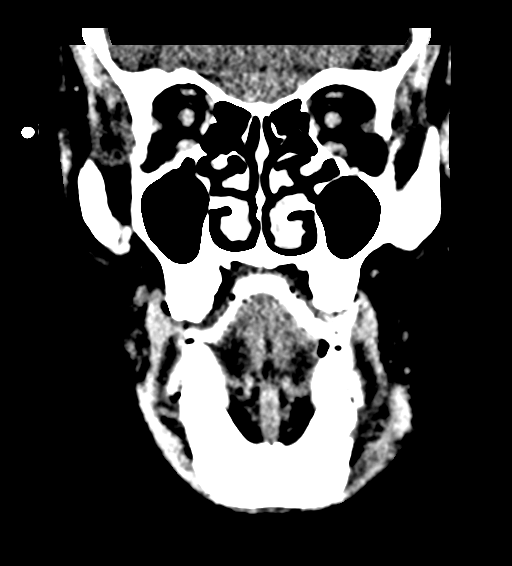
[im 57/86  brain]
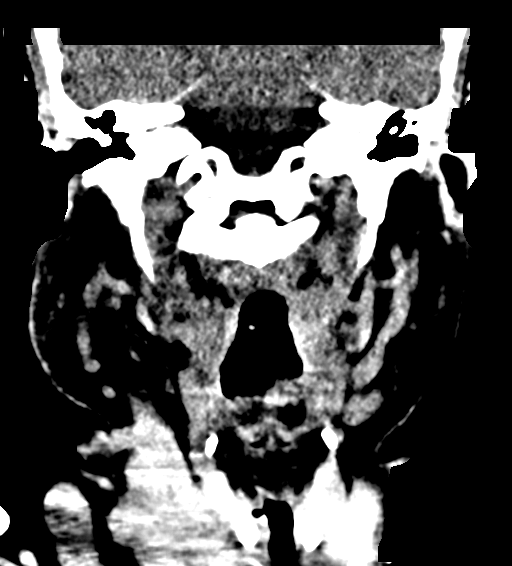

[Series 12: sagittal soft · sagittal · 0.36mm/px · 2 of 79 slices shown]
[im 27/79  brain]
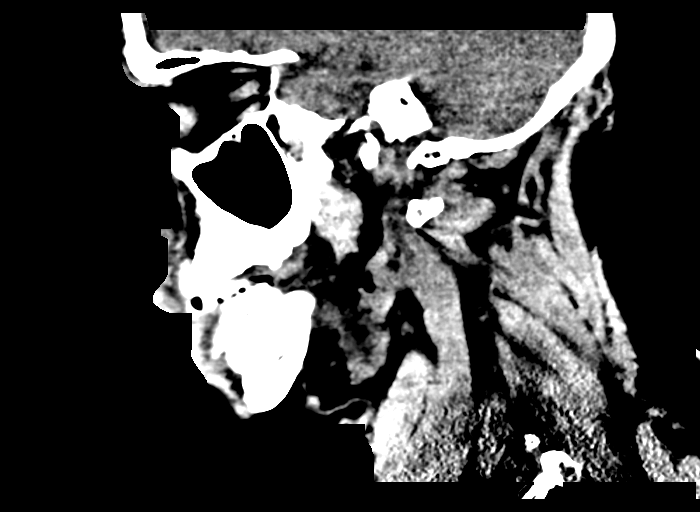
[im 53/79  brain]
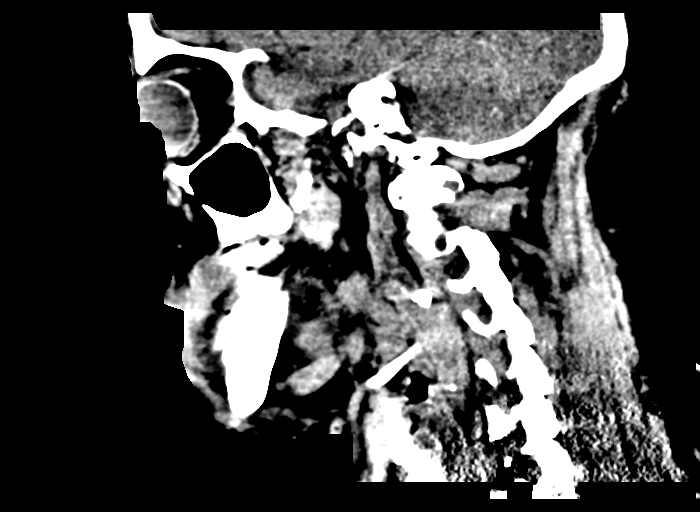

[Series 15: max soft · axial · 0.38mm/px · z∈[+1645,+1703]mm · 4 of 87 slices shown (2 of 2)]
[im 10/87  brain]
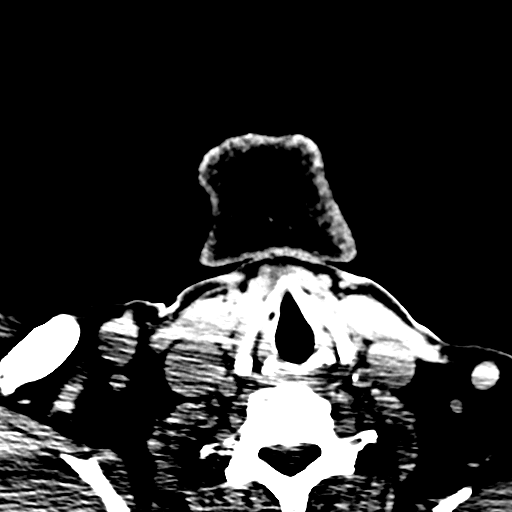
[im 20/87  brain]
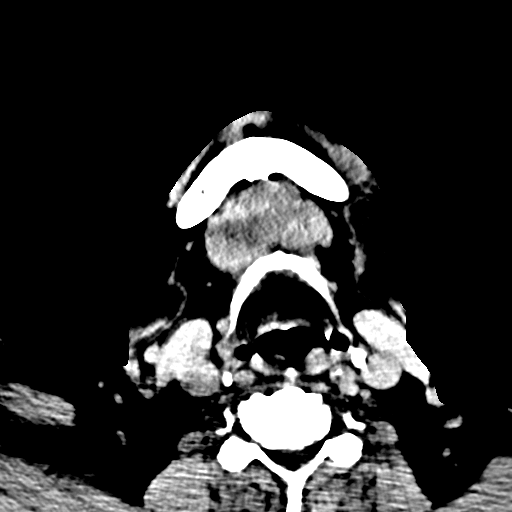
[im 29/87  brain]
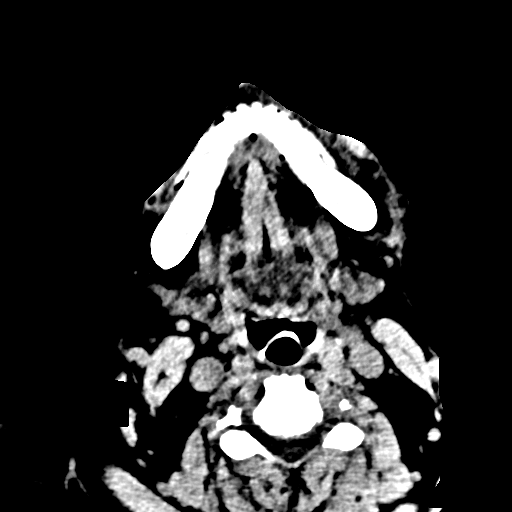
[im 39/87  brain]
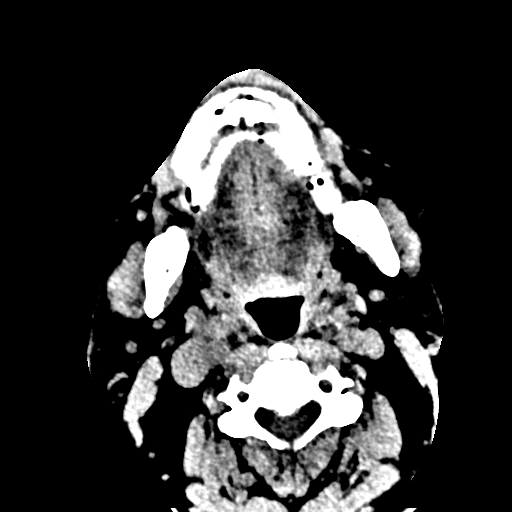

[16 of 47 positions shown; findings below may reference images not displayed]

FINDINGS: CT HEAD FINDINGS

Brain: No intracranial hemorrhage. No parenchymal contusion. No
midline shift or mass effect. Basilar cisterns are patent. No skull
base fracture. No fluid in the paranasal sinuses or mastoid air
cells. Orbits are normal.

Vascular: No hyperdense vessel or unexpected calcification.

Skull: Normal. Negative for fracture or focal lesion.

Other: None.

CT MAXILLOFACIAL FINDINGS

Osseous: No fracture or mandibular dislocation. No destructive
process. Patient is edentulous

Orbits: Intraconal contents are normal. Globes are normal. No
proptosis

Sinuses: No fluid in the paranasal sinuses

Soft tissues: There is a small hematoma within the upper LEFT on the
LEFT (image 54, series 7.
IMPRESSION: 1. No intracranial trauma.
2. No facial fracture.
3. LEFT lip hematoma.

## 2018-06-18 ENCOUNTER — Ambulatory Visit: Payer: Medicare Other | Admitting: Family

## 2018-06-20 ENCOUNTER — Ambulatory Visit (INDEPENDENT_AMBULATORY_CARE_PROVIDER_SITE_OTHER): Payer: Medicare Other | Admitting: Family

## 2018-06-20 ENCOUNTER — Encounter: Payer: Self-pay | Admitting: Family

## 2018-06-20 VITALS — BP 137/81 | HR 81 | Temp 97.2°F | Ht 66.5 in | Wt 190.8 lb

## 2018-06-20 DIAGNOSIS — I10 Essential (primary) hypertension: Secondary | ICD-10-CM

## 2018-06-20 DIAGNOSIS — E663 Overweight: Secondary | ICD-10-CM

## 2018-06-20 DIAGNOSIS — J45909 Unspecified asthma, uncomplicated: Secondary | ICD-10-CM

## 2018-06-20 DIAGNOSIS — E785 Hyperlipidemia, unspecified: Secondary | ICD-10-CM | POA: Diagnosis not present

## 2018-06-20 DIAGNOSIS — I25810 Atherosclerosis of coronary artery bypass graft(s) without angina pectoris: Secondary | ICD-10-CM | POA: Diagnosis not present

## 2018-06-20 DIAGNOSIS — I059 Rheumatic mitral valve disease, unspecified: Secondary | ICD-10-CM

## 2018-06-20 DIAGNOSIS — H8110 Benign paroxysmal vertigo, unspecified ear: Secondary | ICD-10-CM | POA: Diagnosis not present

## 2018-06-20 MED ORDER — FENOFIBRATE 48 MG PO TABS: 48.0000 mg | ORAL_TABLET | Freq: Every day | ORAL | 2 refills | Status: DC

## 2018-06-20 MED ORDER — LOSARTAN POTASSIUM 50 MG PO TABS: 50.0000 mg | ORAL_TABLET | Freq: Every day | ORAL | 3 refills | Status: DC

## 2018-06-20 MED ORDER — LOSARTAN POTASSIUM 100 MG PO TABS: 100.0000 mg | ORAL_TABLET | Freq: Every day | ORAL | 3 refills | Status: DC

## 2018-06-20 MED ORDER — MONTELUKAST SODIUM 10 MG PO TABS
ORAL_TABLET | ORAL | 2 refills | Status: DC
Start: 2018-06-20 — End: 2018-12-19

## 2018-06-20 MED ORDER — ALBUTEROL SULFATE (2.5 MG/3ML) 0.083% IN NEBU: INHALATION_SOLUTION | RESPIRATORY_TRACT | 1 refills | Status: DC

## 2018-06-20 MED ORDER — ALBUTEROL SULFATE HFA 108 (90 BASE) MCG/ACT IN AERS: INHALATION_SPRAY | RESPIRATORY_TRACT | 0 refills | Status: DC

## 2018-06-20 MED ORDER — EPINEPHRINE 0.3 MG/0.3ML IJ SOAJ: INTRAMUSCULAR | 3 refills | Status: DC

## 2018-06-20 NOTE — Patient Instructions (Signed)

## 2018-06-20 NOTE — Progress Notes (Addendum)
Subjective:    Patient ID: Jared Cruz, male    DOB: 16-Jan-1934, 85 y.o.   MRN: [id number]  Chief Complaint  Patient presents with  . Medical Management of Chronic Issues    six month recheck   Pt presents to the office today for chronic follow up.Ptis followed by Cardiologists 6 months forCAD, PVD, andmitral valve prolapse.  Hypertension  This is a chronic problem. The current episode started more than 1 year ago. The problem has been waxing and waning since onset. The problem is uncontrolled. Pertinent negatives include no headaches, malaise/fatigue, peripheral edema or shortness of breath. Risk factors for coronary artery disease include dyslipidemia and male gender. The current treatment provides mild improvement. Hypertensive end-organ damage includes CAD/MI.  Hyperlipidemia  This is a chronic problem. The current episode started more than 1 year ago. The problem is uncontrolled. Recent lipid tests were reviewed and are high. Pertinent negatives include no shortness of breath. Current antihyperlipidemic treatment includes fibric acid derivatives. The current treatment provides no improvement of lipids. Risk factors for coronary artery disease include dyslipidemia, male sex, hypertension and a sedentary lifestyle.  Asthma  He complains of frequent throat clearing. There is no cough, hemoptysis, shortness of breath or wheezing. This is a chronic problem. The current episode started more than 1 year ago. The problem occurs intermittently. The problem has been waxing and waning. Pertinent negatives include no headaches or malaise/fatigue. His past medical history is significant for asthma.  Benign Prostatic Hypertrophy  This is a chronic problem. The current episode started more than 1 year ago. The problem has been waxing and waning since onset. Irritative symptoms include nocturia (2-3 times). Obstructive symptoms do not include an intermittent stream or straining.      Review of  Systems  Constitutional: Negative for malaise/fatigue.  Respiratory: Negative for cough, hemoptysis, shortness of breath and wheezing.   Genitourinary: Positive for nocturia (2-3 times).  Neurological: Negative for headaches.  All other systems reviewed and are negative.      Objective:   Physical Exam Vitals signs reviewed.  Constitutional:      General: He is not in acute distress.    Appearance: He is well-developed.  HENT:     Head: Normocephalic.     Right Ear: Tympanic membrane and external ear normal.     Left Ear: Tympanic membrane and external ear normal.  Eyes:     General:        Right eye: No discharge.        Left eye: No discharge.     Pupils: Pupils are equal, round, and reactive to light.  Neck:     Musculoskeletal: Normal range of motion and neck supple.     Thyroid: No thyromegaly.  Cardiovascular:     Rate and Rhythm: Normal rate and regular rhythm.     Heart sounds: Murmur present.  Pulmonary:     Effort: Pulmonary effort is normal. No respiratory distress.     Breath sounds: Normal breath sounds. No wheezing.  Abdominal:     General: Bowel sounds are normal. There is no distension.     Palpations: Abdomen is soft.     Tenderness: There is no abdominal tenderness.  Musculoskeletal: Normal range of motion.        General: No tenderness.  Skin:    General: Skin is warm and dry.     Findings: No erythema or rash.  Neurological:     Mental Status: He is alert and  oriented to person, place, and time.     Cranial Nerves: No cranial nerve deficit.     Deep Tendon Reflexes: Reflexes are normal and symmetric.  Psychiatric:        Behavior: Behavior normal.        Thought Content: Thought content normal.        Judgment: Judgment normal.     BP (!) 166/93   Pulse 85   Temp (!) 97.2 F (36.2 C) (Oral)   Ht 5' 6.5" (1.689 m)   Wt 190 lb 12.8 oz (86.5 kg)   BMI 30.33 kg/m      Assessment & Plan:  Jared Cruz comes in today with chief  complaint of Medical Management of Chronic Issues (six month recheck)   Diagnosis and orders addressed:  1. Essential hypertension We rechecked BP and new one was 137/81. We will keep losartan at 50 mg -Dash diet information given -Exercise encouraged - Stress Management  -Continue current meds  - CMP14+EGFR - CBC with Differential/Platelet - losartan (COZAAR) 100 MG tablet; Take 1 tablet (100 mg total) by mouth daily.  Dispense: 90 tablet; Refill: 3  2. Coronary artery disease involving coronary bypass graft of native heart without angina pectoris - CMP14+EGFR - CBC with Differential/Platelet  3. Uncomplicated asthma, unspecified asthma severity, unspecified whether persistent - albuterol (PROVENTIL) (2.5 MG/3ML) 0.083% nebulizer solution; NEBULIZE 1 VIAL TWICE A DAY  Dispense: 180 mL; Refill: 1 - montelukast (SINGULAIR) 10 MG tablet; Take 1 tablet (10 mg total) by mouth daily.  Dispense: 90 tablet; Refill: 2 - albuterol (VENTOLIN HFA) 108 (90 Base) MCG/ACT inhaler; Inhale 2 puffs into the lungs every 6 (six) hours as needed for wheezi ng or shortness of breath.  Dispense: 18 g; Refill: 0 - CMP14+EGFR - CBC with Differential/Platelet  4. Benign paroxysmal positional vertigo, unspecified laterality - CMP14+EGFR - CBC with Differential/Platelet  5. Hyperlipidemia, unspecified hyperlipidemia type - fenofibrate (TRICOR) 48 MG tablet; Take 1 tablet (48 mg total) by mouth daily.  Dispense: 90 tablet; Refill: 2 - CMP14+EGFR - CBC with Differential/Platelet - Lipid panel  6. Mitral valve disease - CMP14+EGFR - CBC with Differential/Platelet  7. Overweight (BMI 25.0-29.9) - CMP14+EGFR - CBC with Differential/Platelet   Labs pending Health Maintenance reviewed Diet and exercise encouraged  Follow up plan: 6 months    Evelina Dun, FNP

## 2018-06-21 LAB — CBC WITH DIFFERENTIAL/PLATELET
BASOS ABS: 0.1 10*3/uL (ref 0.0–0.2)
Basos: 1 %
EOS (ABSOLUTE): 0.3 10*3/uL (ref 0.0–0.4)
Eos: 4 %
HEMOGLOBIN: 15.2 g/dL (ref 13.0–17.7)
Hematocrit: 43.9 % (ref 37.5–51.0)
Immature Grans (Abs): 0 10*3/uL (ref 0.0–0.1)
Immature Granulocytes: 0 %
Lymphocytes Absolute: 2.4 10*3/uL (ref 0.7–3.1)
Lymphs: 38 %
MCH: 30.3 pg (ref 26.6–33.0)
MCHC: 34.6 g/dL (ref 31.5–35.7)
MCV: 88 fL (ref 79–97)
Monocytes Absolute: 0.6 10*3/uL (ref 0.1–0.9)
Monocytes: 10 %
Neutrophils Absolute: 3.1 10*3/uL (ref 1.4–7.0)
Neutrophils: 47 %
Platelets: 225 10*3/uL (ref 150–450)
RBC: 5.01 x10E6/uL (ref 4.14–5.80)
RDW: 13.3 % (ref 11.6–15.4)
WBC: 6.4 10*3/uL (ref 3.4–10.8)

## 2018-06-21 LAB — CMP14+EGFR
ALBUMIN: 4.4 g/dL (ref 3.5–4.7)
ALT: 8 IU/L (ref 0–44)
AST: 15 IU/L (ref 0–40)
Albumin/Globulin Ratio: 1.3 (ref 1.2–2.2)
Alkaline Phosphatase: 52 IU/L (ref 39–117)
BUN/Creatinine Ratio: 10 (ref 10–24)
BUN: 16 mg/dL (ref 8–27)
Bilirubin Total: 0.4 mg/dL (ref 0.0–1.2)
CO2: 23 mmol/L (ref 20–29)
Calcium: 10.5 mg/dL — ABNORMAL HIGH (ref 8.6–10.2)
Chloride: 104 mmol/L (ref 96–106)
Creatinine, Ser: 1.57 mg/dL — ABNORMAL HIGH (ref 0.76–1.27)
GFR calc Af Amer: 47 mL/min/{1.73_m2} — ABNORMAL LOW (ref >59)
GFR calc non Af Amer: 40 mL/min/{1.73_m2} — ABNORMAL LOW (ref >59)
GLUCOSE: 98 mg/dL (ref 65–99)
Globulin, Total: 3.3 g/dL (ref 1.5–4.5)
Potassium: 4.9 mmol/L (ref 3.5–5.2)
Sodium: 146 mmol/L — ABNORMAL HIGH (ref 134–144)
TOTAL PROTEIN: 7.7 g/dL (ref 6.0–8.5)

## 2018-06-21 LAB — LIPID PANEL
CHOLESTEROL TOTAL: 252 mg/dL — AB (ref 100–199)
Chol/HDL Ratio: 4.9 ratio (ref 0.0–5.0)
HDL: 51 mg/dL (ref >39)
LDL Calculated: 171 mg/dL — ABNORMAL HIGH (ref 0–99)
TRIGLYCERIDES: 148 mg/dL (ref 0–149)
VLDL Cholesterol Cal: 30 mg/dL (ref 5–40)

## 2018-09-17 ENCOUNTER — Telehealth: Payer: Self-pay | Admitting: Family

## 2018-09-17 DIAGNOSIS — J45909 Unspecified asthma, uncomplicated: Secondary | ICD-10-CM

## 2018-09-17 MED ORDER — ALBUTEROL SULFATE HFA 108 (90 BASE) MCG/ACT IN AERS: INHALATION_SPRAY | RESPIRATORY_TRACT | 0 refills | Status: DC

## 2018-09-17 NOTE — Telephone Encounter (Signed)
Pt is needing a refill on albuterol (PROVENTIL) (2.5 MG/3ML) 0.083% nebulizer solution states that he doesn't have any left   [employer]

## 2018-09-17 NOTE — Telephone Encounter (Signed)
Pt aware refill sent to pharmacy 

## 2018-11-29 ENCOUNTER — Other Ambulatory Visit: Payer: Self-pay | Admitting: Family

## 2018-11-29 DIAGNOSIS — J45909 Unspecified asthma, uncomplicated: Secondary | ICD-10-CM

## 2018-11-29 DIAGNOSIS — H8110 Benign paroxysmal vertigo, unspecified ear: Secondary | ICD-10-CM

## 2018-12-19 ENCOUNTER — Other Ambulatory Visit: Payer: Self-pay

## 2018-12-19 ENCOUNTER — Encounter: Payer: Self-pay | Admitting: Family

## 2018-12-19 ENCOUNTER — Ambulatory Visit (INDEPENDENT_AMBULATORY_CARE_PROVIDER_SITE_OTHER): Payer: Medicare Other | Admitting: Family

## 2018-12-19 DIAGNOSIS — M545 Low back pain, unspecified: Secondary | ICD-10-CM

## 2018-12-19 DIAGNOSIS — I059 Rheumatic mitral valve disease, unspecified: Secondary | ICD-10-CM

## 2018-12-19 DIAGNOSIS — E663 Overweight: Secondary | ICD-10-CM

## 2018-12-19 DIAGNOSIS — I25810 Atherosclerosis of coronary artery bypass graft(s) without angina pectoris: Secondary | ICD-10-CM | POA: Diagnosis not present

## 2018-12-19 DIAGNOSIS — E785 Hyperlipidemia, unspecified: Secondary | ICD-10-CM

## 2018-12-19 DIAGNOSIS — I739 Peripheral vascular disease, unspecified: Secondary | ICD-10-CM | POA: Diagnosis not present

## 2018-12-19 DIAGNOSIS — I10 Essential (primary) hypertension: Secondary | ICD-10-CM

## 2018-12-19 DIAGNOSIS — H8110 Benign paroxysmal vertigo, unspecified ear: Secondary | ICD-10-CM

## 2018-12-19 DIAGNOSIS — J45909 Unspecified asthma, uncomplicated: Secondary | ICD-10-CM

## 2018-12-19 MED ORDER — FENOFIBRATE 48 MG PO TABS: 48.0000 mg | ORAL_TABLET | Freq: Every day | ORAL | 2 refills | Status: DC

## 2018-12-19 MED ORDER — ALBUTEROL SULFATE HFA 108 (90 BASE) MCG/ACT IN AERS: INHALATION_SPRAY | RESPIRATORY_TRACT | 0 refills | Status: DC

## 2018-12-19 MED ORDER — MECLIZINE HCL 25 MG PO TABS: ORAL_TABLET | ORAL | 1 refills | Status: DC

## 2018-12-19 MED ORDER — LOSARTAN POTASSIUM 50 MG PO TABS: 50.0000 mg | ORAL_TABLET | Freq: Every day | ORAL | 3 refills | Status: DC

## 2018-12-19 MED ORDER — MONTELUKAST SODIUM 10 MG PO TABS: ORAL_TABLET | ORAL | 2 refills | Status: DC

## 2018-12-19 NOTE — Progress Notes (Signed)
Virtual Visit via telephone Note  I connected with Jared Cruz on 12/19/18 at 12:29 pm by telephone and verified that I am speaking with the correct person using two identifiers. Jared Cruz is currently located at home and wife is currently with her during visit. The provider, Evelina Dun, FNP is located in their office at time of visit.  I discussed the limitations, risks, security and privacy concerns of performing an evaluation and management service by telephone and the availability of in person appointments. I also discussed with the patient that there may be a patient responsible charge related to this service. The patient expressed understanding and agreed to proceed.   History and Present Illness:  Pt presents to the office today for chronic follow up.Ptis followed by Cardiologists 6 months forCAD, PVD, andmitral valve prolapse. Hypertension This is a chronic problem. The current episode started more than 1 year ago. The problem has been resolved since onset. The problem is controlled. Pertinent negatives include no malaise/fatigue, peripheral edema or shortness of breath. Risk factors for coronary artery disease include dyslipidemia and male gender. The current treatment provides moderate improvement.  Hyperlipidemia This is a chronic problem. The current episode started more than 1 year ago. The problem is uncontrolled. Recent lipid tests were reviewed and are high. Pertinent negatives include no shortness of breath. Current antihyperlipidemic treatment includes diet change and fibric acid derivatives. Risk factors for coronary artery disease include dyslipidemia and male sex.  Asthma There is no cough, shortness of breath or wheezing. This is a chronic problem. The current episode started more than 1 year ago. The problem occurs intermittently. The problem has been waxing and waning. Pertinent negatives include no malaise/fatigue. His symptoms are alleviated by  leukotriene antagonist. He reports moderate improvement on treatment. His past medical history is significant for asthma.  Dizziness This is a chronic problem. The current episode started more than 1 year ago. The problem occurs intermittently. The problem has been waxing and waning. Pertinent negatives include no coughing. The symptoms are aggravated by bending. The treatment provided mild relief.      Review of Systems  Constitutional: Negative for malaise/fatigue.  Respiratory: Negative for cough, shortness of breath and wheezing.   Neurological: Positive for dizziness.  All other systems reviewed and are negative.    Observations/Objective: He reports his weight was this AM188.5 lb, B/P-140/70, Heart Rate- 90  No SOB or distress noted  Assessment and Plan: Jared Cruz comes in today with chief complaint of No chief complaint on file.   Diagnosis and orders addressed:  1. Coronary artery disease involving coronary bypass graft of native heart without angina pectoris  2. Essential hypertension - losartan (COZAAR) 50 MG tablet; Take 1 tablet (50 mg total) by mouth daily.  Dispense: 90 tablet; Refill: 3  3. Peripheral vascular disease ([place])  4. Bilateral low back pain, unspecified chronicity, unspecified whether sciatica present  5. Hyperlipidemia, unspecified hyperlipidemia type - fenofibrate (TRICOR) 48 MG tablet; Take 1 tablet (48 mg total) by mouth daily.  Dispense: 90 tablet; Refill: 2  6. Overweight (BMI 25.0-29.9)  7. Uncomplicated asthma, unspecified asthma severity, unspecified whether persistent - albuterol (VENTOLIN HFA) 108 (90 Base) MCG/ACT inhaler; 2 PUFFS EVERY 6 HOURS AS NEEDED FOR WHEEZING OR SHORTNESS OF BREATH  Dispense: 18 g; Refill: 0 - montelukast (SINGULAIR) 10 MG tablet; Take 1 tablet (10 mg total) by mouth daily.  Dispense: 90 tablet; Refill: 2  8. Benign paroxysmal positional vertigo, unspecified laterality -  meclizine (ANTIVERT) 25 MG  tablet; Take 1 tablet 3 (three) times daily as needed for dizziness.  Dispense: 90 tablet; Refill: 1  9. Mitral valve disease   Labs reviewed- Pt does not wish to come to office at this time b/c of COVID-19. Will hold off on labs until next visit Health Maintenance reviewed Diet and exercise encouraged  Follow up plan: 6 months      I discussed the assessment and treatment plan with the patient. The patient was provided an opportunity to ask questions and all were answered. The patient agreed with the plan and demonstrated an understanding of the instructions.   The patient was advised to call back or seek an in-person evaluation if the symptoms worsen or if the condition fails to improve as anticipated.  The above assessment and management plan was discussed with the patient. The patient verbalized understanding of and has agreed to the management plan. Patient is aware to call the clinic if symptoms persist or worsen. Patient is aware when to return to the clinic for a follow-up visit. Patient educated on when it is appropriate to go to the emergency department.   Time call ended: 12:40 pm   I provided 11 minutes of non-face-to-face time during this encounter.    Evelina Dun, FNP

## 2019-01-08 ENCOUNTER — Other Ambulatory Visit: Payer: Self-pay | Admitting: Family

## 2019-01-08 DIAGNOSIS — J45909 Unspecified asthma, uncomplicated: Secondary | ICD-10-CM

## 2019-04-01 ENCOUNTER — Other Ambulatory Visit: Payer: Self-pay

## 2019-04-02 ENCOUNTER — Other Ambulatory Visit: Payer: Self-pay

## 2019-04-02 ENCOUNTER — Ambulatory Visit (INDEPENDENT_AMBULATORY_CARE_PROVIDER_SITE_OTHER): Payer: Medicare Other | Admitting: *Deleted

## 2019-04-02 DIAGNOSIS — Z23 Encounter for immunization: Secondary | ICD-10-CM

## 2019-04-02 NOTE — Progress Notes (Signed)
Flu shot given

## 2019-04-23 ENCOUNTER — Other Ambulatory Visit: Payer: Self-pay | Admitting: Family

## 2019-04-23 DIAGNOSIS — J45909 Unspecified asthma, uncomplicated: Secondary | ICD-10-CM

## 2019-08-04 ENCOUNTER — Ambulatory Visit (INDEPENDENT_AMBULATORY_CARE_PROVIDER_SITE_OTHER): Payer: Medicare Other

## 2019-08-04 VITALS — BP 129/75 | HR 80 | Ht 67.0 in | Wt 188.0 lb

## 2019-08-04 DIAGNOSIS — Z0000 Encounter for general adult medical examination without abnormal findings: Secondary | ICD-10-CM
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
## 2019-08-19 ENCOUNTER — Other Ambulatory Visit: Payer: Self-pay | Admitting: Family

## 2019-08-19 DIAGNOSIS — J45909 Unspecified asthma, uncomplicated: Secondary | ICD-10-CM

## 2019-12-01 ENCOUNTER — Other Ambulatory Visit: Payer: Self-pay | Admitting: Family

## 2019-12-01 DIAGNOSIS — J45909 Unspecified asthma, uncomplicated: Secondary | ICD-10-CM

## 2019-12-01 DIAGNOSIS — H8110 Benign paroxysmal vertigo, unspecified ear: Secondary | ICD-10-CM

## 2020-01-01 ENCOUNTER — Encounter: Payer: Self-pay | Admitting: Family

## 2020-01-01 ENCOUNTER — Ambulatory Visit (INDEPENDENT_AMBULATORY_CARE_PROVIDER_SITE_OTHER): Payer: Medicare Other | Admitting: Family

## 2020-01-01 DIAGNOSIS — R8290 Unspecified abnormal findings in urine: Secondary | ICD-10-CM | POA: Diagnosis not present

## 2020-01-01 DIAGNOSIS — R350 Frequency of micturition: Secondary | ICD-10-CM | POA: Diagnosis not present

## 2020-01-01 LAB — MICROSCOPIC EXAMINATION
Bacteria, UA: NONE SEEN
Epithelial Cells (non renal): NONE SEEN /hpf (ref 0–10)
Renal Epithel, UA: NONE SEEN /hpf
WBC, UA: NONE SEEN /hpf (ref 0–5)

## 2020-01-01 LAB — URINALYSIS, COMPLETE
Bilirubin, UA: NEGATIVE
Glucose, UA: NEGATIVE
Ketones, UA: NEGATIVE
Leukocytes,UA: NEGATIVE
Nitrite, UA: NEGATIVE
Protein,UA: NEGATIVE
Specific Gravity, UA: 1.025 (ref 1.005–1.030)
Urobilinogen, Ur: 0.2 mg/dL (ref 0.2–1.0)
pH, UA: 5 (ref 5.0–7.5)

## 2020-01-01 NOTE — Progress Notes (Signed)
   Virtual Visit via telephone Note Due to COVID-19 pandemic this visit was conducted virtually. This visit type was conducted due to national recommendations for restrictions regarding the COVID-19 Pandemic (e.g. social distancing, sheltering in place) in an effort to limit this patient's exposure and mitigate transmission in our community. All issues noted in this document were discussed and addressed.  A physical exam was not performed with this format.  I connected with ODEL SCHMID on 01/01/20 at 1:07 pm  by telephone and verified that I am speaking with the correct person using two identifiers. SANAV REMER is currently located at home and wife  is currently with him during visit. The provider, Evelina Dun, FNP is located in their office at time of visit.  I discussed the limitations, risks, security and privacy concerns of performing an evaluation and management service by telephone and the availability of in person appointments. I also discussed with the patient that there may be a patient responsible charge related to this service. The patient expressed understanding and agreed to proceed.   History and Present Illness:  Urinary Frequency  This is a new problem. The current episode started in the past 7 days (three days). The problem occurs intermittently. The problem has been waxing and waning. The pain is at a severity of 0/10. The patient is experiencing no pain. Associated symptoms include frequency and urgency. Pertinent negatives include no chills, discharge, flank pain, hematuria, hesitancy, nausea or vomiting. Associated symptoms comments: Foul smelling in AM. He has tried increased fluids for the symptoms. The treatment provided mild relief.      Review of Systems  Constitutional: Negative for chills.  Gastrointestinal: Negative for nausea and vomiting.  Genitourinary: Positive for frequency and urgency. Negative for flank pain, hematuria and hesitancy.      Observations/Objective: No SOB or distress noted   Assessment and Plan: 1. Urinary frequency - Urinalysis, Complete - Urine Culture  2. Foul smelling urine - Urinalysis, Complete - Urine Culture  Will have patient come in to leave urine sample If positive will give antibiotic Force fluids   I discussed the assessment and treatment plan with the patient. The patient was provided an opportunity to ask questions and all were answered. The patient agreed with the plan and demonstrated an understanding of the instructions.   The patient was advised to call back or seek an in-person evaluation if the symptoms worsen or if the condition fails to improve as anticipated.  The above assessment and management plan was discussed with the patient. The patient verbalized understanding of and has agreed to the management plan. Patient is aware to call the clinic if symptoms persist or worsen. Patient is aware when to return to the clinic for a follow-up visit. Patient educated on when it is appropriate to go to the emergency department.   Time call ended:  1:13 pm   I provided 6 minutes of non-face-to-face time during this encounter.    Evelina Dun, FNP

## 2020-01-01 NOTE — Addendum Note (Signed)
Addended by: Ladean Raya on: 01/01/2020 03:12 PM   Modules accepted: Orders

## 2020-01-03 LAB — URINE CULTURE: Organism ID, Bacteria: NO GROWTH

## 2020-01-15 ENCOUNTER — Other Ambulatory Visit: Payer: Self-pay | Admitting: Family

## 2020-01-15 DIAGNOSIS — J45909 Unspecified asthma, uncomplicated: Secondary | ICD-10-CM

## 2020-01-15 NOTE — Telephone Encounter (Signed)
Last OV 12/19/2018. Last RF 12/19/2018. Next OV not scheduled

## 2020-02-17 ENCOUNTER — Telehealth: Payer: Self-pay | Admitting: Family

## 2020-02-17 NOTE — Telephone Encounter (Signed)
Patient needs appt for his back

## 2020-02-18 ENCOUNTER — Ambulatory Visit (INDEPENDENT_AMBULATORY_CARE_PROVIDER_SITE_OTHER): Payer: Medicare Other | Admitting: Family Medicine

## 2020-02-18 ENCOUNTER — Other Ambulatory Visit: Payer: Self-pay

## 2020-02-18 ENCOUNTER — Encounter: Payer: Self-pay | Admitting: Family Medicine

## 2020-02-18 VITALS — BP 141/82 | HR 92 | Temp 96.7°F | Ht 67.0 in | Wt 191.6 lb

## 2020-02-18 DIAGNOSIS — M25551 Pain in right hip: Secondary | ICD-10-CM

## 2020-02-18 DIAGNOSIS — I10 Essential (primary) hypertension: Secondary | ICD-10-CM | POA: Diagnosis not present

## 2020-02-18 MED ORDER — PREDNISONE 20 MG PO TABS: 40.0000 mg | ORAL_TABLET | Freq: Every day | ORAL | 0 refills | Status: AC

## 2020-02-18 NOTE — Patient Instructions (Signed)
Can take tylenol for pain, apply heat for pain as well.  Make an appointment with PCP for lab work and chronic illness. Return if symptoms worsen or do not improve.   Muscle Strain A muscle strain is an injury that occurs when a muscle is stretched beyond its normal length. Usually, a small number of muscle fibers are torn when this happens. There are three types of muscle strains. First-degree strains have the least amount of muscle fiber tearing and the least amount of pain. Second-degree and third-degree strains have more tearing and pain. Usually, recovery from muscle strain takes 1-2 weeks. Complete healing normally takes 5-6 weeks. What are the causes? This condition is caused when a sudden, violent force is placed on a muscle and stretches it too far. This may occur with a fall, lifting, or sports. What increases the risk? This condition is more likely to develop in athletes and people who are physically active. What are the signs or symptoms? Symptoms of this condition include:  Pain.  Bruising.  Swelling.  Trouble using the muscle. How is this diagnosed? This condition is diagnosed based on a physical exam and your medical history. Tests may also be done, including an X-ray, ultrasound, or MRI. How is this treated? This condition is initially treated with PRICE therapy. This therapy involves:  Protecting the muscle from being injured again.  Resting the injured muscle.  Icing the injured muscle.  Applying pressure (compression) to the injured muscle. This may be done with a splint or elastic bandage.  Raising (elevating) the injured muscle. Your health care provider may also recommend medicine for pain. Follow these instructions at home: If you have a splint:  Wear the splint as told by your health care provider. Remove it only as told by your health care provider.  Loosen the splint if your fingers or toes tingle, become numb, or turn cold and blue.  Keep the splint  clean.  If the splint is not waterproof: ? Do not let it get wet. ? Cover it with a watertight covering when you take a bath or a shower. Managing pain, stiffness, and swelling   If directed, put ice on the injured area. ? If you have a removable splint, remove it as told by your health care provider. ? Put ice in a plastic bag. ? Place a towel between your skin and the bag. ? Leave the ice on for 20 minutes, 2-3 times a day.  Move your fingers or toes often to avoid stiffness and to lessen swelling.  Raise (elevate) the injured area above the level of your heart while you are sitting or lying down.  Wear an elastic bandage as told by your health care provider. Make sure that it is not too tight. General instructions  Take over-the-counter and prescription medicines only as told by your health care provider.  Restrict your activity and rest the injured muscle as told by your health care provider. Gentle movements may be allowed.  If physical therapy was prescribed, do exercises as told by your health care provider.  Do not put pressure on any part of the splint until it is fully hardened. This may take several hours.  Do not use any products that contain nicotine or tobacco, such as cigarettes and e-cigarettes. These can delay bone healing. If you need help quitting, ask your health care provider.  Ask your health care provider when it is safe to drive if you have a splint.  Keep all follow-up visits  as told by your health care provider. This is important. How is this prevented?  Warm up before exercising. This helps to prevent future muscle strains. Contact a health care provider if:  You have more pain or swelling in the injured area. Get help right away if:  You have numbness or tingling or lose a lot of strength in the injured area. Summary  A muscle strain is an injury that occurs when a muscle is stretched beyond its normal length.  This condition is caused when a  sudden, violent force is placed on a muscle and stretches it too far.  This condition is initially treated with PRICE therapy, which involves protecting, resting, icing, compressing, and elevating.  Gentle movements may be allowed. If physical therapy was prescribed, do exercises as told by your health care provider. This information is not intended to replace advice given to you by your health care provider. Make sure you discuss any questions you have with your health care provider. Document Revised: 05/11/2017 Document Reviewed: 07/05/2016 Elsevier Patient Education  [employer]. Prednisone tablets What is this medicine? PREDNISONE (PRED ni sone) is a corticosteroid. It is commonly used to treat inflammation of the skin, joints, lungs, and other organs. Common conditions treated include asthma, allergies, and arthritis. It is also used for other conditions, such as blood disorders and diseases of the adrenal glands. This medicine may be used for other purposes; ask your health care provider or pharmacist if you have questions. COMMON BRAND NAME(S): Deltasone, Predone, Sterapred, Sterapred DS What should I tell my health care provider before I take this medicine? They need to know if you have any of these conditions:  Cushing's syndrome  diabetes  glaucoma  heart disease  high blood pressure  infection (especially a virus infection such as chickenpox, cold sores, or herpes)  kidney disease  liver disease  mental illness  myasthenia gravis  osteoporosis  seizures  stomach or intestine problems  thyroid disease  an unusual or allergic reaction to lactose, prednisone, other medicines, foods, dyes, or preservatives  pregnant or trying to get pregnant  breast-feeding How should I use this medicine? Take this medicine by mouth with a glass of water. Follow the directions on the prescription label. Take this medicine with food. If you are taking this medicine once  a day, take it in the morning. Do not take more medicine than you are told to take. Do not suddenly stop taking your medicine because you may develop a severe reaction. Your doctor will tell you how much medicine to take. If your doctor wants you to stop the medicine, the dose may be slowly lowered over time to avoid any side effects. Talk to your pediatrician regarding the use of this medicine in children. Special care may be needed. Overdosage: If you think you have taken too much of this medicine contact a poison control center or emergency room at once. NOTE: This medicine is only for you. Do not share this medicine with others. What if I miss a dose? If you miss a dose, take it as soon as you can. If it is almost time for your next dose, talk to your doctor or health care professional. You may need to miss a dose or take an extra dose. Do not take double or extra doses without advice. What may interact with this medicine? Do not take this medicine with any of the following medications:  metyrapone  mifepristone This medicine may also interact with the following  medications:  aminoglutethimide  amphotericin B  aspirin and aspirin-like medicines  barbiturates  certain medicines for diabetes, like glipizide or glyburide  cholestyramine  cholinesterase inhibitors  cyclosporine  digoxin  diuretics  ephedrine  male hormones, like estrogens and birth control pills  isoniazid  ketoconazole  NSAIDS, medicines for pain and inflammation, like ibuprofen or naproxen  phenytoin  rifampin  toxoids  vaccines  warfarin This list may not describe all possible interactions. Give your health care provider a list of all the medicines, herbs, non-prescription drugs, or dietary supplements you use. Also tell them if you smoke, drink alcohol, or use illegal drugs. Some items may interact with your medicine. What should I watch for while using this medicine? Visit your doctor or  health care professional for regular checks on your progress. If you are taking this medicine over a prolonged period, carry an identification card with your name and address, the type and dose of your medicine, and your doctor's name and address. This medicine may increase your risk of getting an infection. Tell your doctor or health care professional if you are around anyone with measles or chickenpox, or if you develop sores or blisters that do not heal properly. If you are going to have surgery, tell your doctor or health care professional that you have taken this medicine within the last twelve months. Ask your doctor or health care professional about your diet. You may need to lower the amount of salt you eat. This medicine may increase blood sugar. Ask your healthcare provider if changes in diet or medicines are needed if you have diabetes. What side effects may I notice from receiving this medicine? Side effects that you should report to your doctor or health care professional as soon as possible:  allergic reactions like skin rash, itching or hives, swelling of the face, lips, or tongue  changes in emotions or moods  changes in vision  depressed mood  eye pain  fever or chills, cough, sore throat, pain or difficulty passing urine  signs and symptoms of high blood sugar such as being more thirsty or hungry or having to urinate more than normal. You may also feel very tired or have blurry vision.  swelling of ankles, feet Side effects that usually do not require medical attention (report to your doctor or health care professional if they continue or are bothersome):  confusion, excitement, restlessness  headache  nausea, vomiting  skin problems, acne, thin and shiny skin  trouble sleeping  weight gain This list may not describe all possible side effects. Call your doctor for medical advice about side effects. You may report side effects to FDA at 1-800-FDA-1088. Where  should I keep my medicine? Keep out of the reach of children. Store at room temperature between 15 and 30 degrees C (59 and 86 degrees F). Protect from light. Keep container tightly closed. Throw away any unused medicine after the expiration date. NOTE: This sheet is a summary. It may not cover all possible information. If you have questions about this medicine, talk to your doctor, pharmacist, or health care provider.  2020 Elsevier/Gold Standard (2018-02-26 10:54:22)

## 2020-02-18 NOTE — Progress Notes (Signed)
Subjective: CC: right hip pain PCP: Sharion Balloon, FNP  [facility] Jared Cruz is a 86 y.o. male presenting to clinic today for:  1. Right hip pain Jared Cruz reports right hip pain x1 week.  His wife fell in the shower and he picked her up. When he did, his foot slip and his wife's elbow hit his right him. The pain started the following day. The pain starts in his right groin and extends to his right side. He reports the pain is intermittent, and is worse in the morning and gets better throughout the day. Pain is also worse when sitting, and improves with moving. He has used OTC topical cream and hot showers with relief. He reports the pain has improved overall but is still quite bothersome. Denies urinary symptoms. Denies known history of arthritis.  2. Hypertension  Jared Cruz has not been seen follow up of HTN for sometime. He has not have lab work done this year. He reports walking to visit today and that this is likely why his BP is elevated. BP did lower on recheck. He reports checking his BP at home on a daily basis and is usually 120/70s. Denies changes in vision, dizziness, chest pain, shortness of breath or edema.   Relevant past medical, surgical, family, and social history reviewed and updated as indicated.  Allergies and medications reviewed and updated.  Allergies  Allergen Reactions  . Ace Inhibitors Other (See Comments)    No allergy to this medication per patient-just an interaction with medications already taking  . Atorvastatin     Memory issues and stomach pain with reflux.  . Zocor [Simvastatin] Other (See Comments)    No allergy to this medication per patient-just an interaction with medications already taking  . Fish Allergy Itching and Rash  . Shellfish Allergy Itching and Rash   Past Medical History:  Diagnosis Date  . Asthma    PFT 9/11 - FEC 2.1, 70% predicted; FEV1 1.9, 74% predicted; Normal expiratory loop; DLCO 16.9, 89% predicted   . Carotid artery disease  ([place])   . Dyslipidemia   . Essential hypertension   . Hyperlipidemia   . Mitral valve prolapse    Mild to moderate regurgitation  . Vertigo     Current Outpatient Medications:  .  albuterol (PROVENTIL) (2.5 MG/3ML) 0.083% nebulizer solution, NEBULIZE 1 VIAL TWICE A DAY, Disp: 180 mL, Rfl: 2 .  albuterol (VENTOLIN HFA) 108 (90 Base) MCG/ACT inhaler, 2 PUFFS EVERY 6 HOURS AS NEEDED FOR WHEEZING OR SHORTNESS OF BREATH (Needs to be seen before next refill), Disp: 6.7 g, Rfl: 0 .  aspirin 81 MG EC tablet, Take 81 mg by mouth daily.  , Disp: , Rfl:  .  EPINEPHrine (EPIPEN) 0.3 mg/0.3 mL IJ SOAJ injection, USE AS DIRECTED, Disp: 1 Device, Rfl: 3 .  fenofibrate (TRICOR) 48 MG tablet, Take 1 tablet (48 mg total) by mouth daily., Disp: 90 tablet, Rfl: 2 .  losartan (COZAAR) 50 MG tablet, Take 1 tablet (50 mg total) by mouth daily., Disp: 90 tablet, Rfl: 3 .  meclizine (ANTIVERT) 25 MG tablet, TAKE (1) TABLET THREE TIMES DAILY AS NEEDED FOR DIZZINESS., Disp: 90 tablet, Rfl: 0 .  montelukast (SINGULAIR) 10 MG tablet, TAKE 1 TABLET DAILY, Disp: 90 tablet, Rfl: 0 Social History   Socioeconomic History  . Marital status: Legally Separated    Spouse name: Not on file  . Number of children: 5  . Years of education: 10  . Highest education level:  10th grade  Occupational History  . Occupation: Retired    Comment: Patent examiner  Tobacco Use  . Smoking status: Never Smoker  . Smokeless tobacco: Never Used  Vaping Use  . Vaping Use: Never used  Substance and Sexual Activity  . Alcohol use: No  . Drug use: No  . Sexual activity: Not Currently  Other Topics Concern  . Not on file  Social History Narrative   Lives in a house with his his niece. His wife lives in an apartment and he can visit during the day. He is a felon and can't live in the apartments that she's in.        Social Determinants of Health   Financial Resource Strain:   . Difficulty of Paying Living Expenses: Not on file    Food Insecurity:   . Worried About Charity fundraiser in the Last Year: Not on file  . Ran Out of Food in the Last Year: Not on file  Transportation Needs:   . Lack of Transportation (Medical): Not on file  . Lack of Transportation (Non-Medical): Not on file  Physical Activity:   . Days of Exercise per Week: Not on file  . Minutes of Exercise per Session: Not on file  Stress:   . Feeling of Stress : Not on file  Social Connections:   . Frequency of Communication with Friends and Family: Not on file  . Frequency of Social Gatherings with Friends and Family: Not on file  . Attends Religious Services: Not on file  . Active Member of Clubs or Organizations: Not on file  . Attends Archivist Meetings: Not on file  . Marital Status: Not on file  Intimate Partner Violence:   . Fear of Current or Ex-Partner: Not on file  . Emotionally Abused: Not on file  . Physically Abused: Not on file  . Sexually Abused: Not on file   Family History  Problem Relation Age of Onset  . Diabetes Mother   . Hypertension Mother   . Diabetes Sister   . Asthma Son   . Hypertension Brother   . Heart disease Brother   . Hypertension Brother   . Diabetes Brother   . Peripheral vascular disease Sister   . Hypertension Son     Review of Systems  Constitutional: Negative for chills, fatigue and fever.  Respiratory: Negative for chest tightness.   Cardiovascular: Negative for chest pain.  Gastrointestinal: Negative for abdominal pain, diarrhea, nausea and vomiting.  Genitourinary: Negative for dysuria, flank pain, frequency, hematuria and urgency.  Musculoskeletal: Negative for arthralgias and back pain.     Objective: Today's Vitals   02/18/20 1053 02/18/20 1054 02/18/20 1125  BP: (!) 172/80 (!) 150/71 (!) 141/82  Pulse: 92    Temp: (!) 96.7 F (35.9 C)    TempSrc: Temporal    SpO2: 96%    Weight: 191 lb 9.6 oz (86.9 kg)    Height: 5\' 7"  (1.702 m)     Body mass index is 30.01  kg/m.  Physical Examination:  Physical Exam Vitals and nursing note reviewed.  Constitutional:      General: He is not in acute distress.    Appearance: Normal appearance. He is not ill-appearing or toxic-appearing.  Cardiovascular:     Rate and Rhythm: Normal rate and regular rhythm.     Pulses: Normal pulses.     Heart sounds: Normal heart sounds.  Pulmonary:     Effort: Pulmonary effort is normal.  No respiratory distress.     Breath sounds: Normal breath sounds. No wheezing.  Abdominal:     General: Bowel sounds are normal. There is no distension.     Palpations: Abdomen is soft. There is no mass.     Tenderness: There is no abdominal tenderness. There is no right CVA tenderness, left CVA tenderness, guarding or rebound.  Musculoskeletal:        General: Tenderness present.     Right hip: Tenderness present. No bony tenderness. Normal range of motion. Normal strength.     Left hip: Normal. Normal range of motion.     Right lower leg: No edema.     Left lower leg: No edema.       Legs:  Skin:    General: Skin is warm and dry.  Neurological:     General: No focal deficit present.     Mental Status: He is alert and oriented to person, place, and time.  Psychiatric:        Mood and Affect: Mood normal.        Behavior: Behavior normal.      Results for orders placed or performed in visit on 01/01/20  Urine Culture   Specimen: Urine   UR  Result Value Ref Range   Urine Culture, Routine Final report    Organism ID, Bacteria No growth   Microscopic Examination   Urine  Result Value Ref Range   WBC, UA None seen 0 - 5 /hpf   RBC 0-2 0 - 2 /hpf   Epithelial Cells (non renal) None seen 0 - 10 /hpf   Renal Epithel, UA None seen None seen /hpf   Bacteria, UA None seen None seen/Few  Urinalysis, Complete  Result Value Ref Range   Specific Gravity, UA 1.025 1.005 - 1.030   pH, UA 5.0 5.0 - 7.5   Color, UA Yellow Yellow   Appearance Ur Clear Clear   Leukocytes,UA  Negative Negative   Protein,UA Negative Negative/Trace   Glucose, UA Negative Negative   Ketones, UA Negative Negative   RBC, UA Trace (A) Negative   Bilirubin, UA Negative Negative   Urobilinogen, Ur 0.2 0.2 - 1.0 mg/dL   Nitrite, UA Negative Negative   Microscopic Examination See below:      Assessment/ Plan: Jared Cruz was seen today for hip pain.  Diagnoses and all orders for this visit:  Right hip pain No alarm symptoms today. Last GFR on 1/20 was 47. Discussed avoidance of NSAIDs given unknown current GFR. Prednisone burst given, discussed side effects. Continue topical OTC medication, may take tylenol for pain. Return for worsening symptoms or if symptoms fail to improve. -     predniSONE (DELTASONE) 20 MG tablet; Take 2 tablets (40 mg total) by mouth daily with breakfast for 5 days.  Essential hypertension Discussed making appointment with PCP for labwork and to follow up with HTN and other chronic illness. No alarm symptoms today. Continue to check BP at home and notify of consistent BPs over 140/90.  The above assessment and management plan was discussed with the patient. The patient verbalized understanding of and has agreed to the management plan. Patient is aware to call the clinic if symptoms persist or worsen. Patient is aware when to return to the clinic for a follow-up visit. Patient educated on when it is appropriate to go to the emergency department.   Marjorie Smolder, FNP-C [facility] [address] [place], [place] [address] [phone]

## 2020-03-16 ENCOUNTER — Ambulatory Visit (INDEPENDENT_AMBULATORY_CARE_PROVIDER_SITE_OTHER): Payer: Medicare Other | Admitting: Family

## 2020-03-16 ENCOUNTER — Other Ambulatory Visit: Payer: Self-pay

## 2020-03-16 ENCOUNTER — Encounter: Payer: Self-pay | Admitting: Family

## 2020-03-16 VITALS — BP 137/81 | HR 94 | Temp 96.5°F | Ht 67.0 in | Wt 189.2 lb

## 2020-03-16 DIAGNOSIS — I10 Essential (primary) hypertension: Secondary | ICD-10-CM

## 2020-03-16 DIAGNOSIS — E785 Hyperlipidemia, unspecified: Secondary | ICD-10-CM

## 2020-03-16 DIAGNOSIS — I739 Peripheral vascular disease, unspecified: Secondary | ICD-10-CM

## 2020-03-16 DIAGNOSIS — E663 Overweight: Secondary | ICD-10-CM

## 2020-03-16 DIAGNOSIS — H8110 Benign paroxysmal vertigo, unspecified ear: Secondary | ICD-10-CM

## 2020-03-16 DIAGNOSIS — I059 Rheumatic mitral valve disease, unspecified: Secondary | ICD-10-CM

## 2020-03-16 DIAGNOSIS — Z5329 Procedure and treatment not carried out because of patient's decision for other reasons: Secondary | ICD-10-CM

## 2020-03-16 DIAGNOSIS — I25810 Atherosclerosis of coronary artery bypass graft(s) without angina pectoris: Secondary | ICD-10-CM

## 2020-03-16 DIAGNOSIS — Z5320 Procedure and treatment not carried out because of patient's decision for unspecified reasons: Secondary | ICD-10-CM

## 2020-03-16 DIAGNOSIS — Z23 Encounter for immunization: Secondary | ICD-10-CM | POA: Diagnosis not present

## 2020-03-16 DIAGNOSIS — J45909 Unspecified asthma, uncomplicated: Secondary | ICD-10-CM

## 2020-03-16 DIAGNOSIS — N189 Chronic kidney disease, unspecified: Secondary | ICD-10-CM | POA: Insufficient documentation

## 2020-03-16 DIAGNOSIS — N184 Chronic kidney disease, stage 4 (severe): Secondary | ICD-10-CM | POA: Insufficient documentation

## 2020-03-16 DIAGNOSIS — I4510 Unspecified right bundle-branch block: Secondary | ICD-10-CM

## 2020-03-16 DIAGNOSIS — M545 Low back pain, unspecified: Secondary | ICD-10-CM

## 2020-03-16 LAB — CMP14+EGFR
ALT: 9 IU/L (ref 0–44)
AST: 13 IU/L (ref 0–40)
Albumin/Globulin Ratio: 1.4 (ref 1.2–2.2)
Albumin: 4.3 g/dL (ref 3.6–4.6)
Alkaline Phosphatase: 51 IU/L (ref 44–121)
BUN/Creatinine Ratio: 15 (ref 10–24)
BUN: 26 mg/dL (ref 8–27)
Bilirubin Total: 0.4 mg/dL (ref 0.0–1.2)
CO2: 23 mmol/L (ref 20–29)
Calcium: 9.9 mg/dL (ref 8.6–10.2)
Chloride: 106 mmol/L (ref 96–106)
Creatinine, Ser: 1.76 mg/dL — ABNORMAL HIGH (ref 0.76–1.27)
GFR calc Af Amer: 40 mL/min/{1.73_m2} — ABNORMAL LOW (ref >59)
GFR calc non Af Amer: 35 mL/min/{1.73_m2} — ABNORMAL LOW (ref >59)
Globulin, Total: 3 g/dL (ref 1.5–4.5)
Glucose: 92 mg/dL (ref 65–99)
Potassium: 4.6 mmol/L (ref 3.5–5.2)
Sodium: 144 mmol/L (ref 134–144)
Total Protein: 7.3 g/dL (ref 6.0–8.5)

## 2020-03-16 LAB — CBC WITH DIFFERENTIAL/PLATELET
Basophils Absolute: 0 10*3/uL (ref 0.0–0.2)
Basos: 1 %
EOS (ABSOLUTE): 0.2 10*3/uL (ref 0.0–0.4)
Eos: 4 %
Hematocrit: 44.2 % (ref 37.5–51.0)
Hemoglobin: 15 g/dL (ref 13.0–17.7)
Immature Grans (Abs): 0 10*3/uL (ref 0.0–0.1)
Immature Granulocytes: 0 %
Lymphocytes Absolute: 2.5 10*3/uL (ref 0.7–3.1)
Lymphs: 42 %
MCH: 30.4 pg (ref 26.6–33.0)
MCHC: 33.9 g/dL (ref 31.5–35.7)
MCV: 90 fL (ref 79–97)
Monocytes Absolute: 0.6 10*3/uL (ref 0.1–0.9)
Monocytes: 10 %
Neutrophils Absolute: 2.6 10*3/uL (ref 1.4–7.0)
Neutrophils: 43 %
Platelets: 209 10*3/uL (ref 150–450)
RBC: 4.94 x10E6/uL (ref 4.14–5.80)
RDW: 13 % (ref 11.6–15.4)
WBC: 6.1 10*3/uL (ref 3.4–10.8)

## 2020-03-16 MED ORDER — CYCLOBENZAPRINE HCL 5 MG PO TABS
5.0000 mg | ORAL_TABLET | Freq: Three times a day (TID) | ORAL | 0 refills | Status: DC | PRN
Start: 2020-03-16 — End: 2020-06-17

## 2020-03-16 NOTE — Progress Notes (Signed)
Subjective:    Patient ID: Jared Cruz, male    DOB: 12-22-1933, 86 y.o.   MRN: [id number]  Chief Complaint  Patient presents with  . Medical Management of Chronic Issues    walked to get here states why his BP is high  . Hypertension   Pt presents to the office today for chronic follow up. Ptis followed by Cardiologists 6 months forCAD, PVD, andmitral valve prolapse.  He states his wife fell in the shower about 3-4 weeks ago and he tried lifting her and hurt his back and right hip. He was seen in the office and was given prednisone that helped. Hypertension This is a chronic problem. The current episode started more than 1 year ago. The problem has been resolved since onset. The problem is controlled. Pertinent negatives include no malaise/fatigue, peripheral edema or shortness of breath. Risk factors for coronary artery disease include obesity, male gender and dyslipidemia. The current treatment provides moderate improvement. Hypertensive end-organ damage includes CAD/MI.  Asthma There is no cough, shortness of breath or wheezing. This is a chronic problem. The current episode started more than 1 year ago. The problem occurs intermittently. The problem has been waxing and waning. Pertinent negatives include no malaise/fatigue. He reports significant improvement on treatment. His symptoms are not alleviated by rest and leukotriene antagonist. His past medical history is significant for asthma.  Dizziness This is a chronic problem. The current episode started more than 1 year ago. The problem occurs intermittently. The problem has been resolved. Pertinent negatives include no coughing.  Hyperlipidemia This is a chronic problem. The current episode started more than 1 year ago. The problem is uncontrolled. Recent lipid tests were reviewed and are high. Exacerbating diseases include obesity. Pertinent negatives include no shortness of breath. Current antihyperlipidemic treatment includes  statins. The current treatment provides moderate improvement of lipids. Risk factors for coronary artery disease include male sex, hypertension, dyslipidemia and post-menopausal.  Back Pain This is a chronic problem. The current episode started more than 1 year ago. The problem occurs intermittently. The problem has been waxing and waning since onset. The pain is at a severity of 5/10. The pain is moderate.      Review of Systems  Constitutional: Negative for malaise/fatigue.  Respiratory: Negative for cough, shortness of breath and wheezing.   Musculoskeletal: Positive for back pain.  Neurological: Positive for dizziness.  All other systems reviewed and are negative.      Objective:   Physical Exam Vitals reviewed.  Constitutional:      General: He is not in acute distress.    Appearance: He is well-developed.  HENT:     Head: Normocephalic.     Right Ear: Tympanic membrane normal.     Left Ear: Tympanic membrane and external ear normal.  Eyes:     General:        Right eye: No discharge.        Left eye: No discharge.     Pupils: Pupils are equal, round, and reactive to light.  Neck:     Thyroid: No thyromegaly.  Cardiovascular:     Rate and Rhythm: Normal rate and regular rhythm.     Heart sounds: Normal heart sounds. No murmur heard.   Pulmonary:     Effort: Pulmonary effort is normal. No respiratory distress.     Breath sounds: Normal breath sounds. No wheezing.  Abdominal:     General: Bowel sounds are normal. There is no distension.  Palpations: Abdomen is soft.     Tenderness: There is no abdominal tenderness.  Musculoskeletal:        General: No tenderness. Normal range of motion.     Cervical back: Normal range of motion and neck supple.  Skin:    General: Skin is warm and dry.     Findings: No erythema or rash.  Neurological:     Mental Status: He is alert and oriented to person, place, and time.     Cranial Nerves: No cranial nerve deficit.     Deep  Tendon Reflexes: Reflexes are normal and symmetric.  Psychiatric:        Behavior: Behavior normal.        Thought Content: Thought content normal.        Judgment: Judgment normal.       BP 137/81   Pulse 94   Temp (!) 96.5 F (35.8 C) (Temporal)   Ht $R'5\' 7"'uN$  (1.702 m)   Wt 189 lb 3.2 oz (85.8 kg)   SpO2 99%   BMI 29.63 kg/m      Assessment & Plan:  Jared Cruz comes in today with chief complaint of Medical Management of Chronic Issues (walked to get here states why his BP is high) and Hypertension   Diagnosis and orders addressed:  1. Need for immunization against influenza - Flu Vaccine QUAD High Dose(Fluad) - CMP14+EGFR - CBC with Differential/Platelet  2. Coronary artery disease involving coronary bypass graft of native heart without angina pectoris - CMP14+EGFR - CBC with Differential/Platelet  3. Primary hypertension - CMP14+EGFR - CBC with Differential/Platelet  4. Mitral valve disease - CMP14+EGFR - CBC with Differential/Platelet  5. Peripheral vascular disease ([place]) - CMP14+EGFR - CBC with Differential/Platelet  6. RBBB - CMP14+EGFR - CBC with Differential/Platelet  7. Uncomplicated asthma, unspecified asthma severity, unspecified whether persistent - CMP14+EGFR - CBC with Differential/Platelet  8. Benign paroxysmal positional vertigo, unspecified laterality - CMP14+EGFR - CBC with Differential/Platelet  9. Bilateral low back pain, unspecified chronicity, unspecified whether sciatica present - CMP14+EGFR - CBC with Differential/Platelet  10. Hyperlipidemia, unspecified hyperlipidemia type - CMP14+EGFR - CBC with Differential/Platelet  11. Overweight (BMI 25.0-29.9) - CMP14+EGFR - CBC with Differential/Platelet  12. Chronic kidney disease (CKD), stage IV (severe) (HCC) - CMP14+EGFR - CBC with Differential/Platelet  13. Refusal of statin medication by patient - CMP14+EGFR - CBC with Differential/Platelet   Labs pending Health  Maintenance reviewed Diet and exercise encouraged  Follow up plan: 6 months   Evelina Dun, FNP

## 2020-03-16 NOTE — Patient Instructions (Signed)
Hip Pain The hip is the joint between the upper legs and the lower pelvis. The bones, cartilage, tendons, and muscles of your hip joint support your body and allow you to move around. Hip pain can range from a minor ache to severe pain in one or both of your hips. The pain may be felt on the inside of the hip joint near the groin, or on the outside near the buttocks and upper thigh. You may also have swelling or stiffness in your hip area. Follow these instructions at home: Managing pain, stiffness, and swelling      If directed, put ice on the painful area. To do this: ? Put ice in a plastic bag. ? Place a towel between your skin and the bag. ? Leave the ice on for 20 minutes, 2-3 times a day.  If directed, apply heat to the affected area as often as told by your health care provider. Use the heat source that your health care provider recommends, such as a moist heat pack or a heating pad. ? Place a towel between your skin and the heat source. ? Leave the heat on for 20-30 minutes. ? Remove the heat if your skin turns bright red. This is especially important if you are unable to feel pain, heat, or cold. You may have a greater risk of getting burned. Activity  Do exercises as told by your health care provider.  Avoid activities that cause pain. General instructions   Take over-the-counter and prescription medicines only as told by your health care provider.  Keep a journal of your symptoms. Write down: ? How often you have hip pain. ? The location of your pain. ? What the pain feels like. ? What makes the pain worse.  Sleep with a pillow between your legs on your most comfortable side.  Keep all follow-up visits as told by your health care provider. This is important. Contact a health care provider if:  You cannot put weight on your leg.  Your pain or swelling continues or gets worse after one week.  It gets harder to walk.  You have a fever. Get help right away  if:  You fall.  You have a sudden increase in pain and swelling in your hip.  Your hip is red or swollen or very tender to touch. Summary  Hip pain can range from a minor ache to severe pain in one or both of your hips.  The pain may be felt on the inside of the hip joint near the groin, or on the outside near the buttocks and upper thigh.  Avoid activities that cause pain.  Write down how often you have hip pain, the location of the pain, what makes it worse, and what it feels like. This information is not intended to replace advice given to you by your health care provider. Make sure you discuss any questions you have with your health care provider. Document Revised: 10/14/2018 Document Reviewed: 10/14/2018 Elsevier Patient Education  2020 [employer] -- 

## 2020-03-18 ENCOUNTER — Other Ambulatory Visit: Payer: Self-pay | Admitting: Family

## 2020-03-18 DIAGNOSIS — I10 Essential (primary) hypertension: Secondary | ICD-10-CM

## 2020-03-18 DIAGNOSIS — H8110 Benign paroxysmal vertigo, unspecified ear: Secondary | ICD-10-CM

## 2020-06-14 ENCOUNTER — Other Ambulatory Visit: Payer: Self-pay | Admitting: Family

## 2020-06-14 DIAGNOSIS — I10 Essential (primary) hypertension: Secondary | ICD-10-CM

## 2020-06-14 DIAGNOSIS — E785 Hyperlipidemia, unspecified: Secondary | ICD-10-CM

## 2020-06-14 DIAGNOSIS — J45909 Unspecified asthma, uncomplicated: Secondary | ICD-10-CM

## 2020-06-17 ENCOUNTER — Encounter: Payer: Self-pay | Admitting: Family

## 2020-06-17 ENCOUNTER — Ambulatory Visit (INDEPENDENT_AMBULATORY_CARE_PROVIDER_SITE_OTHER): Payer: Medicare Other | Admitting: Family

## 2020-06-17 ENCOUNTER — Other Ambulatory Visit: Payer: Self-pay

## 2020-06-17 VITALS — BP 149/88 | HR 96 | Temp 96.7°F | Ht 67.0 in | Wt 192.2 lb

## 2020-06-17 DIAGNOSIS — M545 Low back pain, unspecified: Secondary | ICD-10-CM

## 2020-06-17 DIAGNOSIS — Z5329 Procedure and treatment not carried out because of patient's decision for other reasons: Secondary | ICD-10-CM

## 2020-06-17 DIAGNOSIS — I059 Rheumatic mitral valve disease, unspecified: Secondary | ICD-10-CM | POA: Diagnosis not present

## 2020-06-17 DIAGNOSIS — I739 Peripheral vascular disease, unspecified: Secondary | ICD-10-CM | POA: Diagnosis not present

## 2020-06-17 DIAGNOSIS — H8110 Benign paroxysmal vertigo, unspecified ear: Secondary | ICD-10-CM

## 2020-06-17 DIAGNOSIS — E559 Vitamin D deficiency, unspecified: Secondary | ICD-10-CM

## 2020-06-17 DIAGNOSIS — I25810 Atherosclerosis of coronary artery bypass graft(s) without angina pectoris: Secondary | ICD-10-CM | POA: Diagnosis not present

## 2020-06-17 DIAGNOSIS — I10 Essential (primary) hypertension: Secondary | ICD-10-CM | POA: Diagnosis not present

## 2020-06-17 DIAGNOSIS — Z5320 Procedure and treatment not carried out because of patient's decision for unspecified reasons: Secondary | ICD-10-CM

## 2020-06-17 DIAGNOSIS — J45909 Unspecified asthma, uncomplicated: Secondary | ICD-10-CM | POA: Diagnosis not present

## 2020-06-17 DIAGNOSIS — N184 Chronic kidney disease, stage 4 (severe): Secondary | ICD-10-CM | POA: Diagnosis not present

## 2020-06-17 DIAGNOSIS — E785 Hyperlipidemia, unspecified: Secondary | ICD-10-CM | POA: Diagnosis not present

## 2020-06-17 NOTE — Progress Notes (Signed)
Subjective:    Patient ID: Jared Cruz, male    DOB: January 16, 1934, 87 y.o.   MRN: [id number]  Chief Complaint  Patient presents with  . Medical Management of Chronic Issues   Pt presents to the office today for chronic follow up. Ptis followed by Cardiologists 6 months forCAD, PVD, andmitral valve prolapse. Hypertension This is a chronic problem. The current episode started more than 1 year ago. The problem has been resolved since onset. The problem is controlled. Pertinent negatives include no malaise/fatigue, peripheral edema or shortness of breath. Risk factors for coronary artery disease include dyslipidemia, male gender and sedentary lifestyle. The current treatment provides moderate improvement. There is no history of CVA or heart failure.  Asthma There is no cough, shortness of breath or wheezing. This is a chronic problem. The current episode started more than 1 year ago. The problem occurs intermittently. Pertinent negatives include no malaise/fatigue, sore throat or trouble swallowing. He reports moderate improvement on treatment. His past medical history is significant for asthma.  Back Pain This is a chronic problem. The current episode started more than 1 year ago. The problem occurs intermittently. The problem has been waxing and waning since onset. The pain is present in the lumbar spine. The quality of the pain is described as aching. The pain is at a severity of 8/10. The pain is mild. The symptoms are aggravated by lying down. He has tried walking and bed rest for the symptoms. The treatment provided mild relief.  Hyperlipidemia This is a chronic problem. The current episode started more than 1 year ago. The problem is uncontrolled. Exacerbating diseases include obesity. Pertinent negatives include no shortness of breath. Current antihyperlipidemic treatment includes exercise. The current treatment provides mild improvement of lipids. Risk factors for coronary artery  disease include dyslipidemia, hypertension, male sex and a sedentary lifestyle.      Review of Systems  Constitutional: Negative for malaise/fatigue.  HENT: Negative for sore throat and trouble swallowing.   Respiratory: Negative for cough, shortness of breath and wheezing.   Musculoskeletal: Positive for back pain.  All other systems reviewed and are negative.      Objective:   Physical Exam Vitals reviewed.  Constitutional:      General: He is not in acute distress.    Appearance: He is well-developed and well-nourished.  HENT:     Head: Normocephalic.     Right Ear: Tympanic membrane normal.     Left Ear: Tympanic membrane normal.     Mouth/Throat:     Mouth: Oropharynx is clear and moist.  Eyes:     General:        Right eye: No discharge.        Left eye: No discharge.     Pupils: Pupils are equal, round, and reactive to light.  Neck:     Thyroid: No thyromegaly.  Cardiovascular:     Rate and Rhythm: Normal rate and regular rhythm.     Pulses: Intact distal pulses.     Heart sounds: Murmur heard.    Pulmonary:     Effort: Pulmonary effort is normal. No respiratory distress.     Breath sounds: Normal breath sounds. No wheezing.  Abdominal:     General: Bowel sounds are normal. There is no distension.     Palpations: Abdomen is soft.     Tenderness: There is no abdominal tenderness.  Musculoskeletal:        General: No tenderness or edema. Normal range of  motion.     Cervical back: Normal range of motion and neck supple.  Skin:    General: Skin is warm and dry.     Findings: No erythema or rash.  Neurological:     Mental Status: He is alert and oriented to person, place, and time.     Cranial Nerves: No cranial nerve deficit.     Deep Tendon Reflexes: Reflexes are normal and symmetric.  Psychiatric:        Mood and Affect: Mood and affect normal.        Behavior: Behavior normal.        Thought Content: Thought content normal.        Judgment: Judgment  normal.        BP (!) 149/88   Pulse 96   Temp (!) 96.7 F (35.9 C) (Temporal)   Ht $R'5\' 7"'kh$  (1.702 m)   Wt 192 lb 3.2 oz (87.2 kg)   BMI 30.10 kg/m   Assessment & Plan:  Jared Cruz comes in today with chief complaint of Medical Management of Chronic Issues   Diagnosis and orders addressed:  1. Primary hypertension - CMP14+EGFR - CBC with Differential/Platelet  2. Mitral valve disease - CMP14+EGFR - CBC with Differential/Platelet  3. Coronary artery disease involving coronary bypass graft of native heart without angina pectoris - CMP14+EGFR - CBC with Differential/Platelet  4. Peripheral vascular disease ([place]) - CMP14+EGFR - CBC with Differential/Platelet  5. Benign paroxysmal positional vertigo, unspecified laterality - CMP14+EGFR - CBC with Differential/Platelet  6. Uncomplicated asthma, unspecified asthma severity, unspecified whether persistent - CMP14+EGFR - CBC with Differential/Platelet  7. Chronic kidney disease (CKD), stage IV (severe) (HCC) - CMP14+EGFR - CBC with Differential/Platelet  8. Hyperlipidemia, unspecified hyperlipidemia type - CMP14+EGFR - CBC with Differential/Platelet  9. Vitamin D deficiency - CMP14+EGFR - CBC with Differential/Platelet - VITAMIN D 25 Hydroxy (Vit-D Deficiency, Fractures)  10. Refusal of statin medication by patient - CMP14+EGFR - CBC with Differential/Platelet  11. Bilateral low back pain, unspecified chronicity, unspecified whether sciatica present - CMP14+EGFR - CBC with Differential/Platelet   Labs pending Health Maintenance reviewed Diet and exercise encouraged  Follow up plan: 4 months    Evelina Dun, FNP

## 2020-06-17 NOTE — Patient Instructions (Signed)

## 2020-06-18 ENCOUNTER — Other Ambulatory Visit: Payer: Self-pay | Admitting: Family

## 2020-06-18 DIAGNOSIS — R7989 Other specified abnormal findings of blood chemistry: Secondary | ICD-10-CM

## 2020-06-18 LAB — CMP14+EGFR
ALT: 9 IU/L (ref 0–44)
AST: 17 IU/L (ref 0–40)
Albumin/Globulin Ratio: 1.4 (ref 1.2–2.2)
Albumin: 4.3 g/dL (ref 3.6–4.6)
Alkaline Phosphatase: 51 IU/L (ref 44–121)
BUN/Creatinine Ratio: 8 — ABNORMAL LOW (ref 10–24)
BUN: 17 mg/dL (ref 8–27)
Bilirubin Total: 0.4 mg/dL (ref 0.0–1.2)
CO2: 25 mmol/L (ref 20–29)
Calcium: 9.7 mg/dL (ref 8.6–10.2)
Chloride: 105 mmol/L (ref 96–106)
Creatinine, Ser: 2.07 mg/dL — ABNORMAL HIGH (ref 0.76–1.27)
GFR calc Af Amer: 33 mL/min/{1.73_m2} — ABNORMAL LOW (ref >59)
GFR calc non Af Amer: 29 mL/min/{1.73_m2} — ABNORMAL LOW (ref >59)
Globulin, Total: 3.1 g/dL (ref 1.5–4.5)
Glucose: 102 mg/dL — ABNORMAL HIGH (ref 65–99)
Potassium: 5.5 mmol/L — ABNORMAL HIGH (ref 3.5–5.2)
Sodium: 143 mmol/L (ref 134–144)
Total Protein: 7.4 g/dL (ref 6.0–8.5)

## 2020-06-18 LAB — CBC WITH DIFFERENTIAL/PLATELET
Basophils Absolute: 0 10*3/uL (ref 0.0–0.2)
Basos: 1 %
EOS (ABSOLUTE): 0.3 10*3/uL (ref 0.0–0.4)
Eos: 6 %
Hematocrit: 45.8 % (ref 37.5–51.0)
Hemoglobin: 15 g/dL (ref 13.0–17.7)
Immature Grans (Abs): 0 10*3/uL (ref 0.0–0.1)
Immature Granulocytes: 0 %
Lymphocytes Absolute: 2.4 10*3/uL (ref 0.7–3.1)
Lymphs: 42 %
MCH: 29.9 pg (ref 26.6–33.0)
MCHC: 32.8 g/dL (ref 31.5–35.7)
MCV: 91 fL (ref 79–97)
Monocytes Absolute: 0.8 10*3/uL (ref 0.1–0.9)
Monocytes: 14 %
Neutrophils Absolute: 2.1 10*3/uL (ref 1.4–7.0)
Neutrophils: 37 %
Platelets: 205 10*3/uL (ref 150–450)
RBC: 5.02 x10E6/uL (ref 4.14–5.80)
RDW: 13.1 % (ref 11.6–15.4)
WBC: 5.6 10*3/uL (ref 3.4–10.8)

## 2020-06-18 LAB — VITAMIN D 25 HYDROXY (VIT D DEFICIENCY, FRACTURES): Vit D, 25-Hydroxy: 30.3 ng/mL (ref 30.0–100.0)

## 2020-06-21 ENCOUNTER — Other Ambulatory Visit: Payer: Self-pay

## 2020-06-21 ENCOUNTER — Other Ambulatory Visit: Payer: Medicare Other

## 2020-06-21 DIAGNOSIS — R7989 Other specified abnormal findings of blood chemistry: Secondary | ICD-10-CM | POA: Diagnosis not present

## 2020-06-22 LAB — BMP8+EGFR
BUN/Creatinine Ratio: 11 (ref 10–24)
BUN: 22 mg/dL (ref 8–27)
CO2: 21 mmol/L (ref 20–29)
Calcium: 9.5 mg/dL (ref 8.6–10.2)
Chloride: 105 mmol/L (ref 96–106)
Creatinine, Ser: 1.97 mg/dL — ABNORMAL HIGH (ref 0.76–1.27)
GFR calc Af Amer: 35 mL/min/1.73 — ABNORMAL LOW
GFR calc non Af Amer: 30 mL/min/1.73 — ABNORMAL LOW
Glucose: 113 mg/dL — ABNORMAL HIGH (ref 65–99)
Potassium: 4.6 mmol/L (ref 3.5–5.2)
Sodium: 143 mmol/L (ref 134–144)

## 2020-06-23 ENCOUNTER — Ambulatory Visit: Payer: Medicare Other

## 2020-06-30 ENCOUNTER — Other Ambulatory Visit: Payer: Medicare Other

## 2020-06-30 ENCOUNTER — Other Ambulatory Visit: Payer: Self-pay

## 2020-06-30 DIAGNOSIS — R7989 Other specified abnormal findings of blood chemistry: Secondary | ICD-10-CM

## 2020-06-30 LAB — CMP14+EGFR
ALT: 11 IU/L (ref 0–44)
AST: 21 IU/L (ref 0–40)
Albumin/Globulin Ratio: 1.4 (ref 1.2–2.2)
Albumin: 4.4 g/dL (ref 3.6–4.6)
Alkaline Phosphatase: 50 IU/L (ref 44–121)
BUN/Creatinine Ratio: 10 (ref 10–24)
BUN: 21 mg/dL (ref 8–27)
Bilirubin Total: 0.4 mg/dL (ref 0.0–1.2)
CO2: 23 mmol/L (ref 20–29)
Calcium: 10 mg/dL (ref 8.6–10.2)
Chloride: 103 mmol/L (ref 96–106)
Creatinine, Ser: 2.04 mg/dL — ABNORMAL HIGH (ref 0.76–1.27)
GFR calc Af Amer: 34 mL/min/{1.73_m2} — ABNORMAL LOW (ref >59)
GFR calc non Af Amer: 29 mL/min/{1.73_m2} — ABNORMAL LOW (ref >59)
Globulin, Total: 3.2 g/dL (ref 1.5–4.5)
Glucose: 111 mg/dL — ABNORMAL HIGH (ref 65–99)
Potassium: 5.3 mmol/L — ABNORMAL HIGH (ref 3.5–5.2)
Sodium: 141 mmol/L (ref 134–144)
Total Protein: 7.6 g/dL (ref 6.0–8.5)

## 2020-07-07 DIAGNOSIS — I129 Hypertensive chronic kidney disease with stage 1 through stage 4 chronic kidney disease, or unspecified chronic kidney disease: Secondary | ICD-10-CM | POA: Diagnosis not present

## 2020-07-07 DIAGNOSIS — N1832 Chronic kidney disease, stage 3b: Secondary | ICD-10-CM | POA: Diagnosis not present

## 2020-07-07 DIAGNOSIS — I5032 Chronic diastolic (congestive) heart failure: Secondary | ICD-10-CM | POA: Diagnosis not present

## 2020-07-07 DIAGNOSIS — N281 Cyst of kidney, acquired: Secondary | ICD-10-CM | POA: Diagnosis not present

## 2020-07-07 DIAGNOSIS — E875 Hyperkalemia: Secondary | ICD-10-CM | POA: Diagnosis not present

## 2020-07-07 DIAGNOSIS — E559 Vitamin D deficiency, unspecified: Secondary | ICD-10-CM | POA: Diagnosis not present

## 2020-07-16 DIAGNOSIS — E875 Hyperkalemia: Secondary | ICD-10-CM | POA: Diagnosis not present

## 2020-07-16 DIAGNOSIS — N1832 Chronic kidney disease, stage 3b: Secondary | ICD-10-CM | POA: Diagnosis not present

## 2020-07-16 DIAGNOSIS — E559 Vitamin D deficiency, unspecified: Secondary | ICD-10-CM | POA: Diagnosis not present

## 2020-07-16 DIAGNOSIS — R809 Proteinuria, unspecified: Secondary | ICD-10-CM | POA: Diagnosis not present

## 2020-07-16 DIAGNOSIS — I5032 Chronic diastolic (congestive) heart failure: Secondary | ICD-10-CM | POA: Diagnosis not present

## 2020-07-16 DIAGNOSIS — Z79899 Other long term (current) drug therapy: Secondary | ICD-10-CM | POA: Diagnosis not present

## 2020-07-22 ENCOUNTER — Other Ambulatory Visit: Payer: Self-pay | Admitting: Family

## 2020-07-22 DIAGNOSIS — H8110 Benign paroxysmal vertigo, unspecified ear: Secondary | ICD-10-CM

## 2020-08-04 ENCOUNTER — Ambulatory Visit: Payer: Medicare Other

## 2020-08-05 ENCOUNTER — Ambulatory Visit (INDEPENDENT_AMBULATORY_CARE_PROVIDER_SITE_OTHER): Payer: Medicare Other | Admitting: Family

## 2020-08-05 ENCOUNTER — Encounter: Payer: Self-pay | Admitting: Family

## 2020-08-05 VITALS — BP 128/72

## 2020-08-05 DIAGNOSIS — Z0000 Encounter for general adult medical examination without abnormal findings: Secondary | ICD-10-CM | POA: Diagnosis not present
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
## 2020-08-12 ENCOUNTER — Other Ambulatory Visit (HOSPITAL_COMMUNITY): Payer: Self-pay | Admitting: Nephrology

## 2020-08-12 DIAGNOSIS — I129 Hypertensive chronic kidney disease with stage 1 through stage 4 chronic kidney disease, or unspecified chronic kidney disease: Secondary | ICD-10-CM | POA: Diagnosis not present

## 2020-08-12 DIAGNOSIS — E875 Hyperkalemia: Secondary | ICD-10-CM | POA: Diagnosis not present

## 2020-08-12 DIAGNOSIS — N1832 Chronic kidney disease, stage 3b: Secondary | ICD-10-CM | POA: Diagnosis not present

## 2020-08-12 DIAGNOSIS — R7303 Prediabetes: Secondary | ICD-10-CM | POA: Diagnosis not present

## 2020-08-12 DIAGNOSIS — I5032 Chronic diastolic (congestive) heart failure: Secondary | ICD-10-CM | POA: Diagnosis not present

## 2020-08-12 DIAGNOSIS — N281 Cyst of kidney, acquired: Secondary | ICD-10-CM

## 2020-08-19 ENCOUNTER — Ambulatory Visit (HOSPITAL_COMMUNITY)
Admission: RE | Admit: 2020-08-19 | Discharge: 2020-08-19 | Disposition: A | Discharge status: Home / Self Care | Payer: Medicare Other | Source: Ambulatory Visit | Attending: Nephrology | Admitting: Nephrology

## 2020-08-19 ENCOUNTER — Other Ambulatory Visit: Payer: Self-pay

## 2020-08-19 DIAGNOSIS — N189 Chronic kidney disease, unspecified: Secondary | ICD-10-CM | POA: Diagnosis not present

## 2020-08-19 DIAGNOSIS — N281 Cyst of kidney, acquired: Secondary | ICD-10-CM | POA: Insufficient documentation

## 2020-08-19 DIAGNOSIS — N1832 Chronic kidney disease, stage 3b: Secondary | ICD-10-CM | POA: Insufficient documentation

## 2020-08-19 DIAGNOSIS — E875 Hyperkalemia: Secondary | ICD-10-CM | POA: Insufficient documentation

## 2020-09-03 DIAGNOSIS — I129 Hypertensive chronic kidney disease with stage 1 through stage 4 chronic kidney disease, or unspecified chronic kidney disease: Secondary | ICD-10-CM | POA: Diagnosis not present

## 2020-09-03 DIAGNOSIS — N1832 Chronic kidney disease, stage 3b: Secondary | ICD-10-CM | POA: Diagnosis not present

## 2020-09-03 DIAGNOSIS — N281 Cyst of kidney, acquired: Secondary | ICD-10-CM | POA: Diagnosis not present

## 2020-09-03 DIAGNOSIS — I5032 Chronic diastolic (congestive) heart failure: Secondary | ICD-10-CM | POA: Diagnosis not present

## 2020-09-24 ENCOUNTER — Other Ambulatory Visit: Payer: Self-pay | Admitting: Family

## 2020-09-24 DIAGNOSIS — I10 Essential (primary) hypertension: Secondary | ICD-10-CM

## 2020-10-19 ENCOUNTER — Other Ambulatory Visit: Payer: Self-pay

## 2020-10-19 ENCOUNTER — Ambulatory Visit (INDEPENDENT_AMBULATORY_CARE_PROVIDER_SITE_OTHER): Payer: Medicare Other | Admitting: Family

## 2020-10-19 ENCOUNTER — Encounter: Payer: Self-pay | Admitting: Family

## 2020-10-19 VITALS — BP 133/77 | HR 88 | Temp 97.0°F | Ht 67.0 in | Wt 189.0 lb

## 2020-10-19 DIAGNOSIS — Z5320 Procedure and treatment not carried out because of patient's decision for unspecified reasons: Secondary | ICD-10-CM

## 2020-10-19 DIAGNOSIS — I10 Essential (primary) hypertension: Secondary | ICD-10-CM

## 2020-10-19 DIAGNOSIS — I25810 Atherosclerosis of coronary artery bypass graft(s) without angina pectoris: Secondary | ICD-10-CM

## 2020-10-19 DIAGNOSIS — I059 Rheumatic mitral valve disease, unspecified: Secondary | ICD-10-CM

## 2020-10-19 DIAGNOSIS — Z5329 Procedure and treatment not carried out because of patient's decision for other reasons: Secondary | ICD-10-CM | POA: Diagnosis not present

## 2020-10-19 DIAGNOSIS — E785 Hyperlipidemia, unspecified: Secondary | ICD-10-CM | POA: Diagnosis not present

## 2020-10-19 DIAGNOSIS — N184 Chronic kidney disease, stage 4 (severe): Secondary | ICD-10-CM

## 2020-10-19 DIAGNOSIS — J45909 Unspecified asthma, uncomplicated: Secondary | ICD-10-CM

## 2020-10-19 DIAGNOSIS — E663 Overweight: Secondary | ICD-10-CM

## 2020-10-19 MED ORDER — FENOFIBRATE 48 MG PO TABS: 48.0000 mg | ORAL_TABLET | Freq: Every day | ORAL | 1 refills | Status: DC

## 2020-10-19 MED ORDER — LOSARTAN POTASSIUM 50 MG PO TABS
1.0000 | ORAL_TABLET | Freq: Every day | ORAL | 1 refills | Status: DC
Start: 2020-10-19 — End: 2021-12-20

## 2020-10-19 NOTE — Progress Notes (Signed)
Subjective:    Patient ID: Jared Cruz, male    DOB: [birth year], 87 y.o.   MRN: [id number]  Chief Complaint  Patient presents with  . Hypertension    No concerns, fasting      Pt presents to the office today for chronic follow up.Ptis followed by Cardiologists 6 months forCAD, PVD, andmitral valve prolapse. He is followed by Nephrologists monthly.  Hypertension This is a chronic problem. The current episode started more than 1 year ago. The problem has been resolved since onset. The problem is controlled. Pertinent negatives include no malaise/fatigue, peripheral edema or shortness of breath. Risk factors for coronary artery disease include dyslipidemia, obesity and sedentary lifestyle. The current treatment provides moderate improvement.  Asthma There is no cough, shortness of breath or wheezing. This is a chronic problem. The current episode started more than 1 year ago. The problem occurs intermittently. The problem has been waxing and waning. Pertinent negatives include no malaise/fatigue. He reports moderate improvement on treatment. His past medical history is significant for asthma.  Hyperlipidemia This is a chronic problem. The current episode started more than 1 year ago. The problem is uncontrolled. Exacerbating diseases include obesity. Pertinent negatives include no shortness of breath. Current antihyperlipidemic treatment includes diet change. The current treatment provides mild improvement of lipids. Risk factors for coronary artery disease include dyslipidemia, male sex, hypertension and a sedentary lifestyle.      Review of Systems  Constitutional: Negative for malaise/fatigue.  Respiratory: Negative for cough, shortness of breath and wheezing.   All other systems reviewed and are negative.      Objective:   Physical Exam Vitals reviewed.  Constitutional:      General: He is not in acute distress.    Appearance: He is well-developed.  HENT:     Head:  Normocephalic.     Right Ear: Tympanic membrane normal.     Left Ear: Tympanic membrane normal.  Eyes:     General:        Right eye: No discharge.        Left eye: No discharge.     Pupils: Pupils are equal, round, and reactive to light.  Neck:     Thyroid: No thyromegaly.  Cardiovascular:     Rate and Rhythm: Normal rate and regular rhythm.     Heart sounds: Murmur heard.    Pulmonary:     Effort: Pulmonary effort is normal. No respiratory distress.     Breath sounds: Normal breath sounds. No wheezing.  Abdominal:     General: Bowel sounds are normal. There is no distension.     Palpations: Abdomen is soft.     Tenderness: There is no abdominal tenderness.  Musculoskeletal:        General: No tenderness. Normal range of motion.     Cervical back: Normal range of motion and neck supple.  Skin:    General: Skin is warm and dry.     Findings: No erythema or rash.  Neurological:     Mental Status: He is alert and oriented to person, place, and time.     Cranial Nerves: No cranial nerve deficit.     Deep Tendon Reflexes: Reflexes are normal and symmetric.  Psychiatric:        Behavior: Behavior normal.        Thought Content: Thought content normal.        Judgment: Judgment normal.          BP 133/77  Pulse 88   Temp (!) 97 F (36.1 C) (Temporal)   Ht 5\' 7"  (1.702 m)   Wt 189 lb (85.7 kg)   BMI 29.60 kg/m   Assessment & Plan:  Jared Cruz comes in today with chief complaint of Hypertension (No concerns, fasting /)   Diagnosis and orders addressed:  1. Essential hypertension - losartan (COZAAR) 50 MG tablet; Take 1 tablet (50 mg total) by mouth daily.  Dispense: 90 tablet; Refill: 1  2. Hyperlipidemia, unspecified hyperlipidemia type - fenofibrate (TRICOR) 48 MG tablet; Take 1 tablet (48 mg total) by mouth daily.  Dispense: 90 tablet; Refill: 1  3. Coronary artery disease involving coronary bypass graft of native heart without angina pectoris  4.  Primary hypertension  5. Mitral valve disease   6. Uncomplicated asthma, unspecified asthma severity, unspecified whether persistent  7. Chronic kidney disease (CKD), stage IV (severe) ([place])  8. Overweight (BMI 25.0-29.9)  9. Refusal of statin medication by patient   Labs reviewed from Nephrologists  Health Maintenance reviewed Diet and exercise encouraged  Follow up plan: 6 months    Evelina Dun, FNP

## 2020-10-19 NOTE — Patient Instructions (Signed)
Health Maintenance After Age 85 After age 85, you are at a higher risk for certain long-term diseases and infections as well as injuries from falls. Falls are a major cause of broken bones and head injuries in people who are older than age 85. Getting regular preventive care can help to keep you healthy and well. Preventive care includes getting regular testing and making lifestyle changes as recommended by your health care provider. Talk with your health care provider about:  Which screenings and tests you should have. A screening is a test that checks for a disease when you have no symptoms.  A diet and exercise plan that is right for you. What should I know about screenings and tests to prevent falls? Screening and testing are the best ways to find a health problem early. Early diagnosis and treatment give you the best chance of managing medical conditions that are common after age 85. Certain conditions and lifestyle choices may make you more likely to have a fall. Your health care provider may recommend:  Regular vision checks. Poor vision and conditions such as cataracts can make you more likely to have a fall. If you wear glasses, make sure to get your prescription updated if your vision changes.  Medicine review. Work with your health care provider to regularly review all of the medicines you are taking, including over-the-counter medicines. Ask your health care provider about any side effects that may make you more likely to have a fall. Tell your health care provider if any medicines that you take make you feel dizzy or sleepy.  Osteoporosis screening. Osteoporosis is a condition that causes the bones to get weaker. This can make the bones weak and cause them to break more easily.  Blood pressure screening. Blood pressure changes and medicines to control blood pressure can make you feel dizzy.  Strength and balance checks. Your health care provider may recommend certain tests to check your  strength and balance while standing, walking, or changing positions.  Foot health exam. Foot pain and numbness, as well as not wearing proper footwear, can make you more likely to have a fall.  Depression screening. You may be more likely to have a fall if you have a fear of falling, feel emotionally low, or feel unable to do activities that you used to do.  Alcohol use screening. Using too much alcohol can affect your balance and may make you more likely to have a fall. What actions can I take to lower my risk of falls? General instructions  Talk with your health care provider about your risks for falling. Tell your health care provider if: ? You fall. Be sure to tell your health care provider about all falls, even ones that seem minor. ? You feel dizzy, sleepy, or off-balance.  Take over-the-counter and prescription medicines only as told by your health care provider. These include any supplements.  Eat a healthy diet and maintain a healthy weight. A healthy diet includes low-fat dairy products, low-fat (lean) meats, and fiber from whole grains, beans, and lots of fruits and vegetables. Home safety  Remove any tripping hazards, such as rugs, cords, and clutter.  Install safety equipment such as grab bars in bathrooms and safety rails on stairs.  Keep rooms and walkways well-lit. Activity  Follow a regular exercise program to stay fit. This will help you maintain your balance. Ask your health care provider what types of exercise are appropriate for you.  If you need a cane or walker,   use it as recommended by your health care provider.  Wear supportive shoes that have nonskid soles.   Lifestyle  Do not drink alcohol if your health care provider tells you not to drink.  If you drink alcohol, limit how much you have: ? 0-1 drink a day for women. ? 0-2 drinks a day for men.  Be aware of how much alcohol is in your drink. In the [place] one drink equals one typical bottle of beer (12  oz), one-half glass of wine (5 oz), or one shot of hard liquor (1 oz).  Do not use any products that contain nicotine or tobacco, such as cigarettes and e-cigarettes. If you need help quitting, ask your health care provider. Summary  Having a healthy lifestyle and getting preventive care can help to protect your health and wellness after age 85.  Screening and testing are the best way to find a health problem early and help you avoid having a fall. Early diagnosis and treatment give you the best chance for managing medical conditions that are more common for people who are older than age 85.  Falls are a major cause of broken bones and head injuries in people who are older than age 85. Take precautions to prevent a fall at home.  Work with your health care provider to learn what changes you can make to improve your health and wellness and to prevent falls. This information is not intended to replace advice given to you by your health care provider. Make sure you discuss any questions you have with your health care provider. Document Revised: 09/19/2018 Document Reviewed: 04/11/2017 Elsevier Patient Education  2021 [employer]  

## 2020-11-03 ENCOUNTER — Observation Stay (HOSPITAL_COMMUNITY)
Admission: EM | Admit: 2020-11-03 | Discharge: 2020-11-04 | Disposition: A | Discharge status: Home / Self Care | Payer: Medicare Other | Attending: Internal Medicine | Admitting: Internal Medicine

## 2020-11-03 ENCOUNTER — Other Ambulatory Visit: Payer: Self-pay

## 2020-11-03 ENCOUNTER — Observation Stay (HOSPITAL_BASED_OUTPATIENT_CLINIC_OR_DEPARTMENT_OTHER): Payer: Medicare Other

## 2020-11-03 ENCOUNTER — Encounter (HOSPITAL_COMMUNITY): Payer: Self-pay | Admitting: Internal Medicine

## 2020-11-03 DIAGNOSIS — J45909 Unspecified asthma, uncomplicated: Secondary | ICD-10-CM | POA: Insufficient documentation

## 2020-11-03 DIAGNOSIS — R55 Syncope and collapse: Principal | ICD-10-CM | POA: Diagnosis present

## 2020-11-03 DIAGNOSIS — Z743 Need for continuous supervision: Secondary | ICD-10-CM | POA: Diagnosis not present

## 2020-11-03 DIAGNOSIS — R61 Generalized hyperhidrosis: Secondary | ICD-10-CM | POA: Diagnosis not present

## 2020-11-03 DIAGNOSIS — I129 Hypertensive chronic kidney disease with stage 1 through stage 4 chronic kidney disease, or unspecified chronic kidney disease: Secondary | ICD-10-CM | POA: Insufficient documentation

## 2020-11-03 DIAGNOSIS — Z951 Presence of aortocoronary bypass graft: Secondary | ICD-10-CM | POA: Diagnosis not present

## 2020-11-03 DIAGNOSIS — N400 Enlarged prostate without lower urinary tract symptoms: Secondary | ICD-10-CM

## 2020-11-03 DIAGNOSIS — E785 Hyperlipidemia, unspecified: Secondary | ICD-10-CM | POA: Diagnosis not present

## 2020-11-03 DIAGNOSIS — R404 Transient alteration of awareness: Secondary | ICD-10-CM | POA: Diagnosis not present

## 2020-11-03 DIAGNOSIS — Z79899 Other long term (current) drug therapy: Secondary | ICD-10-CM | POA: Insufficient documentation

## 2020-11-03 DIAGNOSIS — I10 Essential (primary) hypertension: Secondary | ICD-10-CM | POA: Diagnosis present

## 2020-11-03 DIAGNOSIS — I059 Rheumatic mitral valve disease, unspecified: Secondary | ICD-10-CM | POA: Diagnosis not present

## 2020-11-03 DIAGNOSIS — N184 Chronic kidney disease, stage 4 (severe): Secondary | ICD-10-CM | POA: Insufficient documentation

## 2020-11-03 DIAGNOSIS — H8110 Benign paroxysmal vertigo, unspecified ear: Secondary | ICD-10-CM | POA: Diagnosis present

## 2020-11-03 DIAGNOSIS — I2510 Atherosclerotic heart disease of native coronary artery without angina pectoris: Secondary | ICD-10-CM | POA: Insufficient documentation

## 2020-11-03 DIAGNOSIS — E559 Vitamin D deficiency, unspecified: Secondary | ICD-10-CM | POA: Diagnosis not present

## 2020-11-03 DIAGNOSIS — N189 Chronic kidney disease, unspecified: Secondary | ICD-10-CM | POA: Diagnosis present

## 2020-11-03 DIAGNOSIS — Z20822 Contact with and (suspected) exposure to covid-19: Secondary | ICD-10-CM | POA: Diagnosis not present

## 2020-11-03 DIAGNOSIS — Z7982 Long term (current) use of aspirin: Secondary | ICD-10-CM | POA: Insufficient documentation

## 2020-11-03 DIAGNOSIS — R42 Dizziness and giddiness: Secondary | ICD-10-CM | POA: Insufficient documentation

## 2020-11-03 LAB — CBC WITH DIFFERENTIAL/PLATELET
Abs Immature Granulocytes: 0.02 10*3/uL (ref 0.00–0.07)
Basophils Absolute: 0 10*3/uL (ref 0.0–0.1)
Basophils Relative: 1 %
Eosinophils Absolute: 0.1 10*3/uL (ref 0.0–0.5)
Eosinophils Relative: 2 %
HCT: 42.1 % (ref 39.0–52.0)
Hemoglobin: 13.6 g/dL (ref 13.0–17.0)
Immature Granulocytes: 0 %
Lymphocytes Relative: 27 %
Lymphs Abs: 1.5 10*3/uL (ref 0.7–4.0)
MCH: 30.4 pg (ref 26.0–34.0)
MCHC: 32.3 g/dL (ref 30.0–36.0)
MCV: 94.2 fL (ref 80.0–100.0)
Monocytes Absolute: 0.5 10*3/uL (ref 0.1–1.0)
Monocytes Relative: 10 %
Neutro Abs: 3.3 10*3/uL (ref 1.7–7.7)
Neutrophils Relative %: 60 %
Platelets: 187 10*3/uL (ref 150–400)
RBC: 4.47 MIL/uL (ref 4.22–5.81)
RDW: 13.3 % (ref 11.5–15.5)
WBC: 5.5 10*3/uL (ref 4.0–10.5)
nRBC: 0 % (ref 0.0–0.2)

## 2020-11-03 LAB — TROPONIN I (HIGH SENSITIVITY)
Troponin I (High Sensitivity): 4 ng/L (ref <18)
Troponin I (High Sensitivity): 7 ng/L (ref <18)

## 2020-11-03 LAB — RESP PANEL BY RT-PCR (FLU A&B, COVID) ARPGX2
Influenza A by PCR: NEGATIVE
Influenza B by PCR: NEGATIVE
SARS Coronavirus 2 by RT PCR: NEGATIVE

## 2020-11-03 LAB — BASIC METABOLIC PANEL
Anion gap: 7 (ref 5–15)
BUN: 29 mg/dL — ABNORMAL HIGH (ref 8–23)
CO2: 25 mmol/L (ref 22–32)
Calcium: 9.4 mg/dL (ref 8.9–10.3)
Chloride: 107 mmol/L (ref 98–111)
Creatinine, Ser: 1.9 mg/dL — ABNORMAL HIGH (ref 0.61–1.24)
GFR, Estimated: 34 mL/min — ABNORMAL LOW (ref >60)
Glucose, Bld: 140 mg/dL — ABNORMAL HIGH (ref 70–99)
Potassium: 4.7 mmol/L (ref 3.5–5.1)
Sodium: 139 mmol/L (ref 135–145)

## 2020-11-03 LAB — ECHOCARDIOGRAM COMPLETE
Area-P 1/2: 2.61 cm2
Height: 68 in
P 1/2 time: 409 msec
S' Lateral: 2.21 cm
Weight: 3008 oz

## 2020-11-03 LAB — MAGNESIUM: Magnesium: 1.9 mg/dL (ref 1.7–2.4)

## 2020-11-03 LAB — BRAIN NATRIURETIC PEPTIDE: B Natriuretic Peptide: 18 pg/mL (ref 0.0–100.0)

## 2020-11-03 LAB — VITAMIN B12: Vitamin B-12: 401 pg/mL (ref 180–914)

## 2020-11-03 LAB — CBG MONITORING, ED: Glucose-Capillary: 116 mg/dL — ABNORMAL HIGH (ref 70–99)

## 2020-11-03 MED ORDER — SODIUM CHLORIDE 0.9 % IV SOLN: INTRAVENOUS | Status: AC

## 2020-11-03 MED ORDER — MECLIZINE HCL 12.5 MG PO TABS: 25.0000 mg | ORAL_TABLET | Freq: Three times a day (TID) | ORAL | Status: DC | PRN

## 2020-11-03 MED ORDER — ONDANSETRON HCL 4 MG PO TABS: 4.0000 mg | ORAL_TABLET | Freq: Four times a day (QID) | ORAL | Status: DC | PRN

## 2020-11-03 MED ORDER — HEPARIN SODIUM (PORCINE) 5000 UNIT/ML IJ SOLN
5000.0000 [IU] | Freq: Three times a day (TID) | INTRAMUSCULAR | Status: DC
  Administered 2020-11-03 – 2020-11-04 (×3): 5000 [IU] via SUBCUTANEOUS
  Filled 2020-11-03 (×3): qty 1

## 2020-11-03 MED ORDER — ONDANSETRON HCL 4 MG/2ML IJ SOLN: 4.0000 mg | Freq: Four times a day (QID) | INTRAMUSCULAR | Status: DC | PRN

## 2020-11-03 MED ORDER — ACETAMINOPHEN 325 MG PO TABS: 650.0000 mg | ORAL_TABLET | Freq: Four times a day (QID) | ORAL | Status: DC | PRN

## 2020-11-03 MED ORDER — ALBUTEROL SULFATE (2.5 MG/3ML) 0.083% IN NEBU: 2.5000 mg | INHALATION_SOLUTION | Freq: Four times a day (QID) | RESPIRATORY_TRACT | Status: DC | PRN

## 2020-11-03 MED ORDER — ASPIRIN EC 81 MG PO TBEC
81.0000 mg | DELAYED_RELEASE_TABLET | Freq: Every day | ORAL | Status: DC
  Administered 2020-11-03 – 2020-11-04 (×2): 81 mg via ORAL
  Filled 2020-11-03 (×2): qty 1

## 2020-11-03 MED ORDER — LOSARTAN POTASSIUM 50 MG PO TABS
50.0000 mg | ORAL_TABLET | Freq: Every day | ORAL | Status: DC
  Administered 2020-11-03 – 2020-11-04 (×2): 50 mg via ORAL
  Filled 2020-11-03 (×2): qty 1

## 2020-11-03 MED ORDER — ACETAMINOPHEN 650 MG RE SUPP: 650.0000 mg | Freq: Four times a day (QID) | RECTAL | Status: DC | PRN

## 2020-11-03 MED ORDER — FINASTERIDE 5 MG PO TABS
5.0000 mg | ORAL_TABLET | Freq: Every day | ORAL | Status: DC
  Administered 2020-11-03 – 2020-11-04 (×2): 5 mg via ORAL
  Filled 2020-11-03 (×2): qty 1

## 2020-11-03 MED ORDER — MONTELUKAST SODIUM 10 MG PO TABS
10.0000 mg | ORAL_TABLET | Freq: Every day | ORAL | Status: DC
  Administered 2020-11-03 – 2020-11-04 (×2): 10 mg via ORAL
  Filled 2020-11-03 (×2): qty 1

## 2020-11-03 NOTE — Progress Notes (Signed)
*  PRELIMINARY RESULTS* Echocardiogram 2D Echocardiogram has been performed.  Jared Cruz 11/03/2020, 12:25 PM

## 2020-11-03 NOTE — ED Triage Notes (Signed)
Patient states he was cooking breakfast when he felt dizzy, lightheaded, shaking; patient sat down and vomited twice with diaphoresis, Patient's family called  [phone]. With EMS patient felt better lying down. CBG 135.
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
## 2020-11-04 DIAGNOSIS — N400 Enlarged prostate without lower urinary tract symptoms: Secondary | ICD-10-CM

## 2020-11-04 DIAGNOSIS — N184 Chronic kidney disease, stage 4 (severe): Secondary | ICD-10-CM | POA: Diagnosis not present

## 2020-11-04 DIAGNOSIS — H8110 Benign paroxysmal vertigo, unspecified ear: Secondary | ICD-10-CM | POA: Diagnosis not present

## 2020-11-04 DIAGNOSIS — I10 Essential (primary) hypertension: Secondary | ICD-10-CM | POA: Diagnosis not present

## 2020-11-04 DIAGNOSIS — E785 Hyperlipidemia, unspecified: Secondary | ICD-10-CM | POA: Diagnosis not present

## 2020-11-04 DIAGNOSIS — R55 Syncope and collapse: Secondary | ICD-10-CM | POA: Diagnosis not present

## 2020-11-04 LAB — CBC
HCT: 41.9 % (ref 39.0–52.0)
Hemoglobin: 13.7 g/dL (ref 13.0–17.0)
MCH: 30.2 pg (ref 26.0–34.0)
MCHC: 32.7 g/dL (ref 30.0–36.0)
MCV: 92.5 fL (ref 80.0–100.0)
Platelets: 194 10*3/uL (ref 150–400)
RBC: 4.53 MIL/uL (ref 4.22–5.81)
RDW: 13.2 % (ref 11.5–15.5)
WBC: 8.6 10*3/uL (ref 4.0–10.5)
nRBC: 0 % (ref 0.0–0.2)

## 2020-11-04 LAB — BASIC METABOLIC PANEL
Anion gap: 8 (ref 5–15)
BUN: 26 mg/dL — ABNORMAL HIGH (ref 8–23)
CO2: 23 mmol/L (ref 22–32)
Calcium: 9 mg/dL (ref 8.9–10.3)
Chloride: 106 mmol/L (ref 98–111)
Creatinine, Ser: 1.55 mg/dL — ABNORMAL HIGH (ref 0.61–1.24)
GFR, Estimated: 44 mL/min — ABNORMAL LOW (ref >60)
Glucose, Bld: 95 mg/dL (ref 70–99)
Potassium: 4.4 mmol/L (ref 3.5–5.1)
Sodium: 137 mmol/L (ref 135–145)

## 2020-11-04 LAB — VITAMIN D 25 HYDROXY (VIT D DEFICIENCY, FRACTURES): Vit D, 25-Hydroxy: 34.02 ng/mL (ref 30–100)

## 2020-11-04 MED ORDER — MECLIZINE HCL 25 MG PO TABS: 25.0000 mg | ORAL_TABLET | Freq: Three times a day (TID) | ORAL | 0 refills | Status: DC | PRN

## 2020-11-04 NOTE — Clinical Social Work Note (Signed)
Spoke with patient's who was speaking with patient's mother about vestibular PT. Provided the choice of providers. Spouse chose [facility] in [place] as it they can see the facility from their home.    Yisell Sprunger, Clydene Pugh, LCSW

## 2020-11-04 NOTE — Discharge Summary (Signed)
Physician Discharge Summary  Jared Cruz VOJ:[id number] DOB: [birth year] DOA: 11/03/2020  PCP: Sharion Balloon, FNP  Admit date: 11/03/2020 Discharge date: 11/04/2020  Time spent: 35 minutes  Recommendations for Outpatient Follow-up:  1. Follow results of event monitoring 2. -Follow final results of vitamin D level 3. -Repeat basic metabolic panel to evaluate lites and renal function 4. Reassess blood pressure and further adjust antihypertensive treatment as required.   Discharge Diagnoses:  Principal Problem:   Near syncope Active Problems:   Hyperlipidemia   Hypertension   Mitral valve disease   Vitamin D deficiency   BPPV (benign paroxysmal positional vertigo)   Chronic kidney disease (CKD), stage IV (severe) (HCC)   Benign prostatic hyperplasia without lower urinary tract symptoms   Discharge Condition: Stable and improved.  Discharged home with instruction to follow-up with PCP and cardiology as an outpatient.  CODE STATUS: Full code.  Diet recommendation: Heart healthy diet.  Filed Weights   11/03/20 0940  Weight: 85.3 kg    History of present illness:  Jared Cruz is a 87 y.o. male with medical history significant of hypertension, hyperlipidemia, mitral valve prolapse, history of vertigo, history of carotid artery disease and history of asthma; who presented to the hospital secondary to near syncope event.  Patient reports waking up this morning without having any acute complaints and was doing some activities of daily living in his kitchen he felt dizzy lightheaded, nauseated and ended experiencing vomiting x2.  He denies chest pain, palpitations, fever, abdominal pain, dysuria, hematuria, melena, hematochezia or any further complaints. On arrival by EMS blood pressure reported to be low with good response to fluid resuscitation.  Patient expressed to be compliant with his medications.  ED Course: EKG demonstrating sinus rhythm without acute ischemic changes.   Chronic right bundle branch block appreciated.  Negative troponin.  Stable vital signs after receiving fluid resuscitation by EMS.  Renal function and electrolytes at baseline for him.  TRH consulted to place in the hospital for observation after near syncopal event.  Hospital Course:  1-near syncope/syncope -No further episodes of lightheadedness, near-syncope or syncope event prior to discharge. -See below for adjustment to antihypertensive regimen -2D echo reassuring. -B12 and TSH within normal limit -Follow vitamin D level (pending at discharge) -Patient advised to maintain adequate hydration and home health PT has been arranged at time of discharge for further vestibular training. -Continue as needed meclizine. -14 days event monitoring has been requested.  2-hyperlipidemia -Continue statins. -Heart healthy diet encouraged.  3-hypertension -Will continue holding Norvasc in the setting of soft/low blood pressure on presentation. -Blood pressure has remained stable currently -Continue the use of Cozaar and Proscar -Heart healthy/low-sodium diet encouraged. -Reassess blood pressure at follow-up visit.  4-history of mitral valve disease -Stable 2D echo; no wall motion abnormalities, no significant valvular defects appreciated (outside from mild regurgitation in his mitral and tricuspid valve), preserved ejection fraction. -Continue outpatient follow-up with cardiology service.  5-history of benign paroxysmal positional vertigo -Continue as needed meclizine -Home health PT for vestibular evaluation and training.  6-history of BPH -Reports no urinary retention symptoms -Continue the use of Proscar.  7-chronic kidney disease a stage IV -Continue outpatient follow-up with nephrology service (patient follow up with Dr. Theador Hawthorne). -Renal function appears to be stable and at baseline currently. -Continue avoiding nephrotoxic agents and maintain adequate hydration. -patient  advised to follow heart healthy/low-sodium diet.  8-history of asthma -No wheezing on exam and good air movement appreciated. -Continue as needed bronchodilator -  Continue Singulair. -Continue outpatient follow-up with PCP and if needed referral to pulmonologist.  Procedures: 2-D echo: 1. Left ventricular ejection fraction, by estimation, is 60 to 65%. The  left ventricle has normal function. The left ventricle has no regional  wall motion abnormalities. There is mild left ventricular hypertrophy.  Left ventricular diastolic parameters  are indeterminate.  2. Right ventricular systolic function is normal. The right ventricular  size is normal. There is moderately elevated pulmonary artery systolic  pressure. The estimated right ventricular systolic pressure is 82.9 mmHg.  3. The mitral valve is grossly normal with systolic bowing. Mild mitral  valve regurgitation.  4. Tricuspid valve regurgitation is mild to moderate.  5. The aortic valve is tricuspid. Aortic valve regurgitation is mild.  Aortic regurgitation PHT measures 409 msec.  6. The inferior vena cava is normal in size with greater than 50%  respiratory variability, suggesting right atrial pressure of 3 mmHg.   Consultations:  None   Discharge Exam: Vitals:   11/03/20 2051 11/04/20 0200  BP: (!) 162/78 (!) 145/75  Pulse: 76 72  Resp: 19 18  Temp: 98.4 F (36.9 C) 98.3 F (36.8 C)  SpO2: 100% 99%    General: afebrile, no CP, no palpitations and no further episodes of lightheadedness or near syncope. Cardiovascular: S1 and S2, no rubs, no gallops, no JVD on exam. Respiratory: Good air movement bilaterally, no using accessory muscles, no wheezing or crackles appreciated on exam. Abdomen: Soft, nontender, positive bowel sounds Extremities: No cyanosis or clubbing.  Discharge Instructions   Discharge Instructions    Diet - low sodium heart healthy   Complete by: As directed    Discharge instructions    Complete by: As directed    Take medications as prescribed Maintain adequate hydration Follow heart healthy/low-sodium diet Follow-up with PCP in 10 days Continue to use meclizine as needed based base on our discussion to further prevent future episodes of dizziness/lightheadedness associated with vertigo.     Allergies as of 11/04/2020      Reactions   Ace Inhibitors Other (See Comments)   No allergy to this medication per patient-just an interaction with medications already taking   Atorvastatin    Memory issues and stomach pain with reflux.   Statins    Memory changes per patient and can not tolerate   Zocor [simvastatin] Other (See Comments)   No allergy to this medication per patient-just an interaction with medications already taking   Fish Allergy Itching, Rash   Shellfish Allergy Itching, Rash      Medication List    STOP taking these medications   amLODipine 2.5 MG tablet Commonly known as: NORVASC     TAKE these medications   albuterol (2.5 MG/3ML) 0.083% nebulizer solution Commonly known as: PROVENTIL NEBULIZE 1 VIAL TWICE A DAY   albuterol 108 (90 Base) MCG/ACT inhaler Commonly known as: VENTOLIN HFA 2 PUFFS EVERY 6 HOURS AS NEEDED FOR WHEEZING OR SHORTNESS OF BREATH   aspirin 81 MG EC tablet Take 81 mg by mouth daily.   EPINEPHrine 0.3 mg/0.3 mL Soaj injection Commonly known as: EpiPen USE AS DIRECTED   fenofibrate 48 MG tablet Commonly known as: TRICOR Take 1 tablet (48 mg total) by mouth daily.   finasteride 5 MG tablet Commonly known as: PROSCAR Take 5 mg by mouth daily.   losartan 50 MG tablet Commonly known as: COZAAR Take 1 tablet (50 mg total) by mouth daily.   meclizine 25 MG tablet Commonly known as: ANTIVERT  Take 1 tablet (25 mg total) by mouth every 8 (eight) hours as needed for dizziness. What changed: See the new instructions.   montelukast 10 MG tablet Commonly known as: SINGULAIR TAKE 1 TABLET DAILY      Allergies   Allergen Reactions  . Ace Inhibitors Other (See Comments)    No allergy to this medication per patient-just an interaction with medications already taking  . Atorvastatin     Memory issues and stomach pain with reflux.  . Statins     Memory changes per patient and can not tolerate  . Zocor [Simvastatin] Other (See Comments)    No allergy to this medication per patient-just an interaction with medications already taking  . Fish Allergy Itching and Rash  . Shellfish Allergy Itching and Rash    Follow-up Information    Sharion Balloon, FNP. Schedule an appointment as soon as possible for a visit in 10 day(s).   Specialty: Family Medicine Contact information: [place] [address] [phone]               The results of significant diagnostics from this hospitalization (including imaging, microbiology, ancillary and laboratory) are listed below for reference.    Significant Diagnostic Studies: ECHOCARDIOGRAM COMPLETE  Result Date: 11/03/2020    ECHOCARDIOGRAM REPORT   Patient Name:   RASHAD AULD Date of Exam: 11/03/2020 Medical Rec #:  [id number]       Height:       68.0 in Accession #:    [id number]      Weight:       188.0 lb Date of Birth:  1934-05-17      BSA:          1.991 m Patient Age:    15 years        BP:           157/81 mmHg Patient Gender: M               HR:           65 bpm. Exam Location:  [facility] Procedure: 2D Echo Indications:    Syncope R55  History:        Patient has prior history of Echocardiogram examinations, most                 recent 08/16/2016. CAD; Risk Factors:Dyslipidemia, Hypertension                 and Non-Smoker. RBBB, Mitral Valve Disease.  Sonographer:    Leavy Cella RDCS (AE) Referring Phys: [place]  1. Left ventricular ejection fraction, by estimation, is 60 to 65%. The left ventricle has normal function. The left ventricle has no regional wall motion abnormalities. There is mild left  ventricular hypertrophy. Left ventricular diastolic parameters are indeterminate.  2. Right ventricular systolic function is normal. The right ventricular size is normal. There is moderately elevated pulmonary artery systolic pressure. The estimated right ventricular systolic pressure is 42.7 mmHg.  3. The mitral valve is grossly normal with systolic bowing. Mild mitral valve regurgitation.  4. Tricuspid valve regurgitation is mild to moderate.  5. The aortic valve is tricuspid. Aortic valve regurgitation is mild. Aortic regurgitation PHT measures 409 msec.  6. The inferior vena cava is normal in size with greater than 50% respiratory variability, suggesting right atrial pressure of 3 mmHg. FINDINGS  Left Ventricle: Left ventricular ejection fraction, by estimation, is 60 to 65%. The left ventricle  has normal function. The left ventricle has no regional wall motion abnormalities. The left ventricular internal cavity size was normal in size. There is  mild left ventricular hypertrophy. Left ventricular diastolic parameters are indeterminate. Right Ventricle: The right ventricular size is normal. No increase in right ventricular wall thickness. Right ventricular systolic function is normal. There is moderately elevated pulmonary artery systolic pressure. The tricuspid regurgitant velocity is 3.31 m/s, and with an assumed right atrial pressure of 3 mmHg, the estimated right ventricular systolic pressure is 09.6 mmHg. Left Atrium: Left atrial size was normal in size. Right Atrium: Right atrial size was normal in size. Pericardium: There is no evidence of pericardial effusion. Mitral Valve: The mitral valve is grossly normal. Mild mitral valve regurgitation. Tricuspid Valve: The tricuspid valve is grossly normal. Tricuspid valve regurgitation is mild to moderate. Aortic Valve: The aortic valve is tricuspid. Aortic valve regurgitation is mild. Aortic regurgitation PHT measures 409 msec. Pulmonic Valve: The pulmonic valve  was grossly normal. Pulmonic valve regurgitation is trivial. Aorta: The aortic root is normal in size and structure. Venous: The inferior vena cava is normal in size with greater than 50% respiratory variability, suggesting right atrial pressure of 3 mmHg. IAS/Shunts: No atrial level shunt detected by color flow Doppler.  LEFT VENTRICLE PLAX 2D LVIDd:         4.22 cm  Diastology LVIDs:         2.21 cm  LV e' medial:    5.33 cm/s LV PW:         1.29 cm  LV E/e' medial:  17.4 LV IVS:        1.17 cm  LV e' lateral:   7.51 cm/s LVOT diam:     2.20 cm  LV E/e' lateral: 12.4 LVOT Area:     3.80 cm  RIGHT VENTRICLE RV S prime:     19.10 cm/s TAPSE (M-mode): 3.9 cm LEFT ATRIUM             Index       RIGHT ATRIUM           Index LA diam:        4.00 cm 2.01 cm/m  RA Area:     14.40 cm LA Vol (A2C):   43.5 ml 21.85 ml/m RA Volume:   34.50 ml  17.33 ml/m LA Vol (A4C):   35.8 ml 17.98 ml/m LA Biplane Vol: 40.0 ml 20.09 ml/m  AORTIC VALVE AI PHT:      409 msec  AORTA Ao Root diam: 3.00 cm MITRAL VALVE               TRICUSPID VALVE MV Area (PHT): 2.61 cm    TR Peak grad:   43.8 mmHg MV Decel Time: 291 msec    TR Vmax:        331.00 cm/s MV E velocity: 93.00 cm/s MV A velocity: 95.20 cm/s  SHUNTS MV E/A ratio:  0.98        Systemic Diam: 2.20 cm Rozann Lesches MD Electronically signed by Rozann Lesches MD Signature Date/Time: 11/03/2020/3:06:22 PM    Final     Microbiology: Recent Results (from the past 240 hour(s))  Resp Panel by RT-PCR (Flu A&B, Covid) Nasopharyngeal Swab     Status: None   Collection Time: 11/03/20  9:29 AM   Specimen: Nasopharyngeal Swab; Nasopharyngeal(NP) swabs in vial transport medium  Result Value Ref Range Status   SARS Coronavirus 2 by RT PCR NEGATIVE NEGATIVE Final  Comment: (NOTE) SARS-CoV-2 target nucleic acids are NOT DETECTED.  The SARS-CoV-2 RNA is generally detectable in upper respiratory specimens during the acute phase of infection. The lowest concentration of  SARS-CoV-2 viral copies this assay can detect is 138 copies/mL. A negative result does not preclude SARS-Cov-2 infection and should not be used as the sole basis for treatment or other patient management decisions. A negative result may occur with  improper specimen collection/handling, submission of specimen other than nasopharyngeal swab, presence of viral mutation(s) within the areas targeted by this assay, and inadequate number of viral copies(<138 copies/mL). A negative result must be combined with clinical observations, patient history, and epidemiological information. The expected result is Negative.  Fact Sheet for Patients:  EntrepreneurPulse.com.au  Fact Sheet for Healthcare Providers:  IncredibleEmployment.be  This test is no t yet approved or cleared by the [place] FDA and  has been authorized for detection and/or diagnosis of SARS-CoV-2 by FDA under an Emergency Use Authorization (EUA). This EUA will remain  in effect (meaning this test can be used) for the duration of the COVID-19 declaration under Section 564(b)(1) of the Act, 21 U.S.C.section 360bbb-3(b)(1), unless the authorization is terminated  or revoked sooner.       Influenza A by PCR NEGATIVE NEGATIVE Final   Influenza B by PCR NEGATIVE NEGATIVE Final    Comment: (NOTE) The Xpert Xpress SARS-CoV-2/FLU/RSV plus assay is intended as an aid in the diagnosis of influenza from Nasopharyngeal swab specimens and should not be used as a sole basis for treatment. Nasal washings and aspirates are unacceptable for Xpert Xpress SARS-CoV-2/FLU/RSV testing.  Fact Sheet for Patients: EntrepreneurPulse.com.au  Fact Sheet for Healthcare Providers: IncredibleEmployment.be  This test is not yet approved or cleared by the [place] FDA and has been authorized for detection and/or diagnosis of SARS-CoV-2 by FDA under an Emergency Use  Authorization (EUA). This EUA will remain in effect (meaning this test can be used) for the duration of the COVID-19 declaration under Section 564(b)(1) of the Act, 21 U.S.C. section 360bbb-3(b)(1), unless the authorization is terminated or revoked.  Performed at [facility], [address] [place], [place] [address]      Labs: Basic Metabolic Panel: Recent Labs  Lab 11/03/20 0955 11/04/20 0532  NA 139 137  K 4.7 4.4  CL 107 106  CO2 25 23  GLUCOSE 140* 95  BUN 29* 26*  CREATININE 1.90* 1.55*  CALCIUM 9.4 9.0  MG 1.9  --    CBC: Recent Labs  Lab 11/03/20 0955 11/04/20 0532  WBC 5.5 8.6  NEUTROABS 3.3  --   HGB 13.6 13.7  HCT 42.1 41.9  MCV 94.2 92.5  PLT 187 194   BNP (last 3 results) Recent Labs    11/03/20 0955  BNP 18.0    CBG: Recent Labs  Lab 11/03/20 0947  GLUCAP 116*   Signed:  Barton Dubois MD.  [facility] 11/04/2020, 8:58 AM

## 2020-11-04 NOTE — Evaluation (Signed)
Physical Therapy Evaluation Patient Details Name: WILLMAR STOCKINGER MRN: [id number] DOB: 12/13/1933 Today's Date: 11/04/2020   History of Present Illness  DAVANTA MEUSER is a 87 y.o. male with medical history significant of hypertension, hyperlipidemia, mitral valve prolapse, history of vertigo, history of carotid artery disease and history of asthma; who presented to the hospital secondary to near syncope event.  Patient reports waking up this morning without having any acute complaints and was doing some activities of daily living in his kitchen he felt dizzy lightheaded, nauseated and ended experiencing vomiting x2.  He denies chest pain, palpitations, fever, abdominal pain, dysuria, hematuria, melena, hematochezia or any further complaints.  On arrival by EMS blood pressure reported to be low with good response to fluid resuscitation.  Patient expressed to be compliant with his medications.    Clinical Impression  Patient functioning at baseline for functional mobility and gait.  Plan:  Patient discharged from physical therapy to care of nursing for ambulation daily as tolerated for length of stay.     Follow Up Recommendations No PT follow up    Equipment Recommendations  None recommended by PT    Recommendations for Other Services       Precautions / Restrictions Precautions Precautions: None Restrictions Weight Bearing Restrictions: No      Mobility  Bed Mobility Overal bed mobility: Modified Independent             General bed mobility comments: poor tolerance for lying flat due to dizziness    Transfers Overall transfer level: Modified independent                  Ambulation/Gait Ambulation/Gait assistance: Modified independent (Device/Increase time) Gait Distance (Feet): 200 Feet Assistive device: None Gait Pattern/deviations: WFL(Within Functional Limits) Gait velocity: decreased   General Gait Details: grossly WFL with good return for ambulation  on level, inclined and declined surfaces without loss of balance  Stairs            Wheelchair Mobility    Modified Rankin (Stroke Patients Only)       Balance Overall balance assessment: No apparent balance deficits (not formally assessed)                                           Pertinent Vitals/Pain Pain Assessment: No/denies pain    Home Living Family/patient expects to be discharged to:: Private residence Living Arrangements: Other relatives Available Help at Discharge: Family;Available 24 hours/day Type of Home: House Home Access: Level entry     Home Layout: One level Home Equipment: Shower seat;Grab bars - tub/shower;Bedside commode      Prior Function Level of Independence: Independent         Comments: community ambulator, does not drive     Hand Dominance        Extremity/Trunk Assessment   Upper Extremity Assessment Upper Extremity Assessment: Overall WFL for tasks assessed    Lower Extremity Assessment Lower Extremity Assessment: Overall WFL for tasks assessed    Cervical / Trunk Assessment Cervical / Trunk Assessment: Normal  Communication   Communication: No difficulties  Cognition Arousal/Alertness: Awake/alert Behavior During Therapy: WFL for tasks assessed/performed Overall Cognitive Status: Within Functional Limits for tasks assessed  General Comments      Exercises     Assessment/Plan    PT Assessment Patent does not need any further PT services  PT Problem List         PT Treatment Interventions      PT Goals (Current goals can be found in the Care Plan section)  Acute Rehab PT Goals Patient Stated Goal: return home with family to assist PT Goal Formulation: With patient Time For Goal Achievement: 11/04/20 Potential to Achieve Goals: Good    Frequency     Barriers to discharge        Co-evaluation               AM-PAC  PT "6 Clicks" Mobility  Outcome Measure Help needed turning from your back to your side while in a flat bed without using bedrails?: None Help needed moving from lying on your back to sitting on the side of a flat bed without using bedrails?: None Help needed moving to and from a bed to a chair (including a wheelchair)?: None Help needed standing up from a chair using your arms (e.g., wheelchair or bedside chair)?: None Help needed to walk in hospital room?: None Help needed climbing 3-5 steps with a railing? : None 6 Click Score: 24    End of Session   Activity Tolerance: Patient tolerated treatment well Patient left: in bed;with call bell/phone within reach Nurse Communication: Mobility status PT Visit Diagnosis: Unsteadiness on feet (R26.81);Other abnormalities of gait and mobility (R26.89);Muscle weakness (generalized) (M62.81)    Time: 1740-8144 PT Time Calculation (min) (ACUTE ONLY): 15 min   Charges:   PT Evaluation $PT Eval Low Complexity: 1 Low PT Treatments $Therapeutic Activity: 8-22 mins        11:56 AM, 11/04/20 Lonell Grandchild, MPT Physical Therapist with [facility] 336 [phone] office [phone] mobile phone

## 2020-11-05 ENCOUNTER — Other Ambulatory Visit: Payer: Self-pay

## 2020-11-05 ENCOUNTER — Telehealth: Payer: Self-pay

## 2020-11-05 ENCOUNTER — Ambulatory Visit (INDEPENDENT_AMBULATORY_CARE_PROVIDER_SITE_OTHER): Payer: Medicare Other

## 2020-11-05 ENCOUNTER — Other Ambulatory Visit: Payer: Self-pay | Admitting: Internal Medicine

## 2020-11-05 ENCOUNTER — Ambulatory Visit: Payer: Medicare Other

## 2020-11-05 DIAGNOSIS — R002 Palpitations: Secondary | ICD-10-CM | POA: Diagnosis not present

## 2020-11-05 NOTE — Telephone Encounter (Signed)
Transition Care Management Follow-up Telephone Call  Date of discharge and from where: 11/04/20 [facility]  Diagnosis: Syncope  How have you been since you were released from the hospital? Feels fine now  Any questions or concerns? No  Items Reviewed:  Did the pt receive and understand the discharge instructions provided? Yes   Medications obtained and verified? Yes  - he was told to stop Norvasc until he sees PCP - Is taking meclizine every 8 hrs  Other? No   Any new allergies since your discharge? No   Dietary orders reviewed? Yes  Do you have support at home? Yes   Home Care and Equipment/Supplies: Were home health services ordered? no Were any new equipment or medical supplies ordered?  No  Functional Questionnaire: (I = Independent and D = Dependent) ADLs: I  Bathing/Dressing- I  Meal Prep- I  Eating- I  Maintaining continence- I  Transferring/Ambulation- I  Managing Meds- I  Follow up appointments reviewed:   PCP Hospital f/u appt confirmed? Yes  Scheduled to see Evelina Dun on 11/11/20 @ 8.  [facility] f/u appt confirmed? Yes  Scheduled to see Bhutani on 6/3 @ 1.  Are transportation arrangements needed? No   If their condition worsens, is the pt aware to call PCP or go to the Emergency Dept.? Yes  Was the patient provided with contact information for the PCP's office or ED? Yes  Was to pt encouraged to call back with questions or concerns? Yes

## 2020-11-05 NOTE — Progress Notes (Signed)
Pt is here today for a nurse visit- 14 day Zio monitor placement for hospitalist, C. Dyann Kief- MD, for palpitations.

## 2020-11-11 ENCOUNTER — Ambulatory Visit (INDEPENDENT_AMBULATORY_CARE_PROVIDER_SITE_OTHER): Payer: Medicare Other | Admitting: Family

## 2020-11-11 ENCOUNTER — Encounter: Payer: Self-pay | Admitting: Family

## 2020-11-11 ENCOUNTER — Other Ambulatory Visit: Payer: Self-pay

## 2020-11-11 VITALS — BP 129/64 | HR 60 | Temp 96.2°F | Wt 187.8 lb

## 2020-11-11 DIAGNOSIS — I10 Essential (primary) hypertension: Secondary | ICD-10-CM | POA: Diagnosis not present

## 2020-11-11 DIAGNOSIS — R55 Syncope and collapse: Secondary | ICD-10-CM

## 2020-11-11 DIAGNOSIS — Z09 Encounter for follow-up examination after completed treatment for conditions other than malignant neoplasm: Secondary | ICD-10-CM

## 2020-11-11 NOTE — Progress Notes (Signed)
Subjective:    Patient ID: Jared Cruz, male    DOB: [birth year], 87 y.o.   MRN: [id number]  Chief Complaint  Patient presents with  . Transitions Of Care    HPI Today's visit was for Transitional Care Management.  The patient was  discharged from [facility] on 11/04/20 with a primary diagnosis of near syncope.   Contact with the patient and/or caregiver, by a clinical staff member, was made on 11/05/20 and was documented as a telephone encounter within the EMR.  Through chart review and discussion with the patient I have determined that management of their condition is of moderate complexity.     Pt states he woke up and felt dizzy, nauseate and went to the ED. He was found to be hypotensive. ECHO stable. Was given IV fluids. He reports his dizziness resolved. Feels well now.     Review of Systems  All other systems reviewed and are negative.      Objective:   Physical Exam Vitals reviewed.  Constitutional:      General: He is not in acute distress.    Appearance: He is well-developed.  HENT:     Head: Normocephalic.     Right Ear: Tympanic membrane normal.     Left Ear: Tympanic membrane normal.  Eyes:     General:        Right eye: No discharge.        Left eye: No discharge.     Pupils: Pupils are equal, round, and reactive to light.  Neck:     Thyroid: No thyromegaly.  Cardiovascular:     Rate and Rhythm: Normal rate and regular rhythm.     Heart sounds: Normal heart sounds. No murmur heard.   Pulmonary:     Effort: Pulmonary effort is normal. No respiratory distress.     Breath sounds: Normal breath sounds. No wheezing.  Abdominal:     General: Bowel sounds are normal. There is no distension.     Palpations: Abdomen is soft.     Tenderness: There is no abdominal tenderness.  Musculoskeletal:        General: No tenderness. Normal range of motion.     Cervical back: Normal range of motion and neck supple.  Skin:    General: Skin is warm and dry.      Findings: No erythema or rash.  Neurological:     Mental Status: He is alert and oriented to person, place, and time.     Cranial Nerves: No cranial nerve deficit.     Deep Tendon Reflexes: Reflexes are normal and symmetric.  Psychiatric:        Behavior: Behavior normal.        Thought Content: Thought content normal.        Judgment: Judgment normal.      BP 129/64 Comment: patient reports at home  Pulse 60   Temp (!) 96.2 F (35.7 C) (Temporal)   Wt 187 lb 12.8 oz (85.2 kg)   SpO2 98%   BMI 28.55 kg/m      Assessment & Plan:  Jared Cruz comes in today with chief complaint of Transitions Of Care   Diagnosis and orders addressed:  1. Hospital discharge follow-up  2. Near syncope  3. Primary hypertension Continue to hold Norvasc given BP at home is stable  -Dash diet information given -Exercise encouraged - Stress Management  -Continue current meds -RTO in 1 month      Abbee Cremeens  Lenna Gilford, [place]

## 2020-11-11 NOTE — Patient Instructions (Signed)
Near-Syncope Near-syncope is when you suddenly feel like you might pass out (faint), but you do not actually lose consciousness. This may also be referred to as presyncope. During an episode of near-syncope, you may:  Feel dizzy, weak, or light-headed.  Feel nauseous.  See all white or all black in your field of vision, or see spots.  Have cold, clammy skin. This condition is caused by a sudden decrease in blood flow to the brain. This decrease can result from various causes, but most of those causes are not dangerous. However, near-syncope may be a sign of a serious medical problem, so it is important to seek medical care. Follow these instructions at home: Medicines  Take over-the-counter and prescription medicines only as told by your health care provider.  If you are taking blood pressure or heart medicine, get up slowly and take several minutes to sit and then stand. This can reduce dizziness. General instructions  Pay attention to any changes in your symptoms.  Talk with your health care provider about your symptoms. You may need to have testing to understand the cause of your near-syncope.  If you start to feel like you might faint, lie down right away and raise (elevate) your feet above the level of your heart. Breathe deeply and steadily. Wait until all of the symptoms have passed.  Have someone stay with you until you feel stable.  Do not drive, use machinery, or play sports until your health care provider says it is okay.  Drink enough fluid to keep your urine pale yellow.  Keep all follow-up visits as told by your health care provider. This is important. Get help right away if you:  Have a seizure.  Have unusual pain in your chest, abdomen, or back.  Faint once or repeatedly.  Have a severe headache.  Are bleeding from your mouth or rectum, or you have black or tarry stool.  Have a very fast or irregular heartbeat (palpitations).  Are confused.  Have  trouble walking.  Have severe weakness.  Have vision problems. These symptoms may represent a serious problem that is an emergency. Do not wait to see if your symptoms will go away. Get medical help right away. Call your local emergency services (911 in the [place] Do not drive yourself to the hospital. Summary  Near-syncope is when you suddenly feel like you might pass out (faint), but you do not actually lose consciousness.  This condition is caused by a sudden decrease in blood flow to the brain. This decrease can result from various causes, but most of those causes are not dangerous.  Near-syncope may be a sign of a serious medical problem, so it is important to seek medical care. This information is not intended to replace advice given to you by your health care provider. Make sure you discuss any questions you have with your health care provider. Document Revised: 09/20/2018 Document Reviewed: 04/17/2018 Elsevier Patient Education  2021 Elsevier Inc.  

## 2020-11-12 ENCOUNTER — Other Ambulatory Visit: Payer: Self-pay | Admitting: *Deleted

## 2020-11-12 DIAGNOSIS — E875 Hyperkalemia: Secondary | ICD-10-CM | POA: Diagnosis not present

## 2020-11-12 DIAGNOSIS — N1832 Chronic kidney disease, stage 3b: Secondary | ICD-10-CM | POA: Diagnosis not present

## 2020-11-12 DIAGNOSIS — N281 Cyst of kidney, acquired: Secondary | ICD-10-CM | POA: Diagnosis not present

## 2020-11-12 DIAGNOSIS — I5032 Chronic diastolic (congestive) heart failure: Secondary | ICD-10-CM | POA: Diagnosis not present

## 2020-11-12 DIAGNOSIS — J45909 Unspecified asthma, uncomplicated: Secondary | ICD-10-CM

## 2020-11-12 DIAGNOSIS — I129 Hypertensive chronic kidney disease with stage 1 through stage 4 chronic kidney disease, or unspecified chronic kidney disease: Secondary | ICD-10-CM | POA: Diagnosis not present

## 2020-11-12 MED ORDER — ALBUTEROL SULFATE HFA 108 (90 BASE) MCG/ACT IN AERS: INHALATION_SPRAY | RESPIRATORY_TRACT | 2 refills | Status: DC

## 2020-11-12 NOTE — Telephone Encounter (Signed)
Seen yesterday, needs his albuterol inhaler refilled to [place] sent

## 2020-11-24 DIAGNOSIS — R002 Palpitations: Secondary | ICD-10-CM | POA: Diagnosis not present

## 2020-11-30 ENCOUNTER — Other Ambulatory Visit: Payer: Self-pay | Admitting: Family

## 2020-11-30 DIAGNOSIS — J45909 Unspecified asthma, uncomplicated: Secondary | ICD-10-CM

## 2020-12-14 ENCOUNTER — Other Ambulatory Visit: Payer: Self-pay

## 2020-12-14 ENCOUNTER — Ambulatory Visit (INDEPENDENT_AMBULATORY_CARE_PROVIDER_SITE_OTHER): Payer: Medicare Other | Admitting: Family

## 2020-12-14 ENCOUNTER — Encounter: Payer: Self-pay | Admitting: Family

## 2020-12-14 VITALS — BP 129/66 | HR 82 | Temp 97.5°F | Ht 68.0 in | Wt 188.2 lb

## 2020-12-14 DIAGNOSIS — N184 Chronic kidney disease, stage 4 (severe): Secondary | ICD-10-CM | POA: Diagnosis not present

## 2020-12-14 DIAGNOSIS — I10 Essential (primary) hypertension: Secondary | ICD-10-CM

## 2020-12-14 DIAGNOSIS — I129 Hypertensive chronic kidney disease with stage 1 through stage 4 chronic kidney disease, or unspecified chronic kidney disease: Secondary | ICD-10-CM | POA: Diagnosis not present

## 2020-12-14 DIAGNOSIS — R42 Dizziness and giddiness: Secondary | ICD-10-CM

## 2020-12-14 DIAGNOSIS — Z23 Encounter for immunization: Secondary | ICD-10-CM

## 2020-12-14 NOTE — Patient Instructions (Signed)

## 2020-12-14 NOTE — Progress Notes (Signed)
Subjective:    Patient ID: Jared Cruz, male    DOB: Nov 27, 1933, 87 y.o.   MRN: [id number]  Chief Complaint  Patient presents with   Follow-up    On BP    PT presents to the office today to recheck BP. He was seen on 11/11/20 for hospital follow up and syncope. We stopped his Norvasc. Today, his BP is elevated, but at home states his BP is 129/66. Denies any dizziness or light headed.   Pt is followed by Nephrologists for CKD that is stable.  Hypertension This is a chronic problem. The current episode started more than 1 year ago. The problem has been waxing and waning since onset. Pertinent negatives include no malaise/fatigue, peripheral edema or shortness of breath. The current treatment provides mild improvement.   Home BP readings:     Review of Systems  Constitutional:  Negative for malaise/fatigue.  Respiratory:  Negative for shortness of breath.   All other systems reviewed and are negative.     Objective:   Physical Exam Vitals reviewed.  Constitutional:      General: He is not in acute distress.    Appearance: He is well-developed.  HENT:     Head: Normocephalic.     Right Ear: Tympanic membrane normal.     Left Ear: Tympanic membrane normal.  Eyes:     General:        Right eye: No discharge.        Left eye: No discharge.     Pupils: Pupils are equal, round, and reactive to light.  Neck:     Thyroid: No thyromegaly.  Cardiovascular:     Rate and Rhythm: Normal rate and regular rhythm.     Heart sounds: Normal heart sounds. No murmur heard. Pulmonary:     Effort: Pulmonary effort is normal. No respiratory distress.     Breath sounds: Normal breath sounds. No wheezing.  Abdominal:     General: Bowel sounds are normal. There is no distension.     Palpations: Abdomen is soft.     Tenderness: There is no abdominal tenderness.  Musculoskeletal:        General: No tenderness. Normal range of motion.     Cervical back: Normal range of motion and neck  supple.  Skin:    General: Skin is warm and dry.     Findings: No erythema or rash.  Neurological:     Mental Status: He is alert and oriented to person, place, and time.     Cranial Nerves: No cranial nerve deficit.     Deep Tendon Reflexes: Reflexes are normal and symmetric.  Psychiatric:        Behavior: Behavior normal.        Thought Content: Thought content normal.        Judgment: Judgment normal.      BP (!) 149/78   Pulse 82   Temp (!) 97.5 F (36.4 C) (Temporal)   Ht _0  (1.727 m)   Wt 188 lb 3.2 oz (85.4 kg)   SpO2 97%   BMI 28.62 kg/m      Assessment & Plan:  Jared Cruz comes in today with chief complaint of Follow-up (On BP/)   Diagnosis and orders addressed:  1. Primary hypertension Will hold off on medication changes as home BP is normal -Dash diet information given -Exercise encouraged - Stress Management  -Continue current medications  - BMP8+EGFR  2. Chronic kidney disease (CKD), stage  IV (severe) ([place]) Continue to have good control of BP  - BMP8+EGFR  3. Dizziness Improved since stopping Norvasc   Pt would like to hold off on lab work as he is scheduled to have labs drawn at his Cardiologists next week.   Evelina Dun, FNP

## 2020-12-28 DIAGNOSIS — E875 Hyperkalemia: Secondary | ICD-10-CM | POA: Diagnosis not present

## 2020-12-28 DIAGNOSIS — N1832 Chronic kidney disease, stage 3b: Secondary | ICD-10-CM | POA: Diagnosis not present

## 2020-12-28 DIAGNOSIS — I129 Hypertensive chronic kidney disease with stage 1 through stage 4 chronic kidney disease, or unspecified chronic kidney disease: Secondary | ICD-10-CM | POA: Diagnosis not present

## 2020-12-28 DIAGNOSIS — N281 Cyst of kidney, acquired: Secondary | ICD-10-CM | POA: Diagnosis not present

## 2020-12-28 DIAGNOSIS — I5032 Chronic diastolic (congestive) heart failure: Secondary | ICD-10-CM | POA: Diagnosis not present

## 2021-01-07 ENCOUNTER — Other Ambulatory Visit: Payer: Self-pay | Admitting: Family

## 2021-01-12 DIAGNOSIS — I5032 Chronic diastolic (congestive) heart failure: Secondary | ICD-10-CM | POA: Diagnosis not present

## 2021-01-12 DIAGNOSIS — N1832 Chronic kidney disease, stage 3b: Secondary | ICD-10-CM | POA: Diagnosis not present

## 2021-01-12 DIAGNOSIS — I129 Hypertensive chronic kidney disease with stage 1 through stage 4 chronic kidney disease, or unspecified chronic kidney disease: Secondary | ICD-10-CM | POA: Diagnosis not present

## 2021-01-12 DIAGNOSIS — R7303 Prediabetes: Secondary | ICD-10-CM | POA: Diagnosis not present

## 2021-03-17 ENCOUNTER — Ambulatory Visit (INDEPENDENT_AMBULATORY_CARE_PROVIDER_SITE_OTHER): Payer: Medicare Other | Admitting: Family

## 2021-03-17 ENCOUNTER — Other Ambulatory Visit: Payer: Self-pay

## 2021-03-17 ENCOUNTER — Encounter: Payer: Self-pay | Admitting: Family

## 2021-03-17 VITALS — BP 162/88 | HR 82 | Temp 96.9°F | Ht 68.0 in | Wt 187.8 lb

## 2021-03-17 DIAGNOSIS — H8110 Benign paroxysmal vertigo, unspecified ear: Secondary | ICD-10-CM

## 2021-03-17 DIAGNOSIS — I10 Essential (primary) hypertension: Secondary | ICD-10-CM

## 2021-03-17 DIAGNOSIS — Z5320 Procedure and treatment not carried out because of patient's decision for unspecified reasons: Secondary | ICD-10-CM

## 2021-03-17 DIAGNOSIS — E663 Overweight: Secondary | ICD-10-CM

## 2021-03-17 DIAGNOSIS — E785 Hyperlipidemia, unspecified: Secondary | ICD-10-CM

## 2021-03-17 DIAGNOSIS — I059 Rheumatic mitral valve disease, unspecified: Secondary | ICD-10-CM | POA: Diagnosis not present

## 2021-03-17 DIAGNOSIS — N184 Chronic kidney disease, stage 4 (severe): Secondary | ICD-10-CM | POA: Diagnosis not present

## 2021-03-17 DIAGNOSIS — I25810 Atherosclerosis of coronary artery bypass graft(s) without angina pectoris: Secondary | ICD-10-CM

## 2021-03-17 DIAGNOSIS — E559 Vitamin D deficiency, unspecified: Secondary | ICD-10-CM | POA: Diagnosis not present

## 2021-03-17 DIAGNOSIS — M545 Low back pain, unspecified: Secondary | ICD-10-CM

## 2021-03-17 DIAGNOSIS — J45909 Unspecified asthma, uncomplicated: Secondary | ICD-10-CM | POA: Diagnosis not present

## 2021-03-17 NOTE — Progress Notes (Addendum)
Subjective:    Patient ID: Jared Cruz, male    DOB: Jul 12, 1933, 87 y.o.   MRN: [id number]  Chief Complaint  Patient presents with   Medical Management of Chronic Issues   Pt presents to the office today for chronic follow up. Pt is followed by Cardiologists 6 months for CAD, PVD, and mitral valve prolapse.  He is followed by Nephrologists monthly.  Hypertension This is a chronic problem. The current episode started more than 1 year ago. The problem has been resolved since onset. The problem is controlled. Pertinent negatives include no malaise/fatigue, peripheral edema or shortness of breath. Risk factors for coronary artery disease include dyslipidemia, obesity, male gender and sedentary lifestyle. The current treatment provides moderate improvement. Hypertensive end-organ damage includes CAD/MI. There is no history of heart failure.  Hyperlipidemia This is a chronic problem. The current episode started more than 1 year ago. Exacerbating diseases include obesity. Pertinent negatives include no shortness of breath. Current antihyperlipidemic treatment includes statins. The current treatment provides moderate improvement of lipids. Risk factors for coronary artery disease include dyslipidemia, male sex, hypertension and a sedentary lifestyle.  Dizziness This is a chronic problem. The current episode started more than 1 year ago. The problem occurs intermittently. Associated symptoms include coughing. The symptoms are aggravated by standing and bending. He has tried rest for the symptoms. The treatment provided moderate relief.  Asthma He complains of cough. There is no shortness of breath or wheezing. This is a chronic problem. The problem occurs intermittently. Pertinent negatives include no malaise/fatigue. His past medical history is significant for asthma.  Back Pain This is a chronic problem. The current episode started more than 1 year ago. The problem occurs intermittently. The pain  is present in the lumbar spine. The quality of the pain is described as aching. The pain is at a severity of 1/10. The pain is mild.     Review of Systems  Constitutional:  Negative for malaise/fatigue.  Respiratory:  Positive for cough. Negative for shortness of breath and wheezing.   Musculoskeletal:  Positive for back pain.  Neurological:  Positive for dizziness.  All other systems reviewed and are negative.     Objective:   Physical Exam Vitals reviewed.  Constitutional:      General: He is not in acute distress.    Appearance: He is well-developed.  HENT:     Head: Normocephalic.     Right Ear: Tympanic membrane normal.     Left Ear: Tympanic membrane normal.  Eyes:     General:        Right eye: No discharge.        Left eye: No discharge.     Pupils: Pupils are equal, round, and reactive to light.  Neck:     Thyroid: No thyromegaly.  Cardiovascular:     Rate and Rhythm: Normal rate and regular rhythm.     Heart sounds: Murmur heard.  Pulmonary:     Effort: Pulmonary effort is normal. No respiratory distress.     Breath sounds: Normal breath sounds. No wheezing.  Abdominal:     General: Bowel sounds are normal. There is no distension.     Palpations: Abdomen is soft.     Tenderness: There is no abdominal tenderness.  Musculoskeletal:        General: No tenderness. Normal range of motion.     Cervical back: Normal range of motion and neck supple.  Skin:    General: Skin is warm  and dry.     Findings: No erythema or rash.  Neurological:     Mental Status: He is alert and oriented to person, place, and time.     Cranial Nerves: No cranial nerve deficit.     Deep Tendon Reflexes: Reflexes are normal and symmetric.  Psychiatric:        Behavior: Behavior normal.        Thought Content: Thought content normal.        Judgment: Judgment normal.      BP (!) 162/88   Pulse 82   Temp (!) 96.9 F (36.1 C) (Temporal)   Ht 5' 8" (1.727 m)   Wt 187 lb 12.8 oz  (85.2 kg)   BMI 28.55 kg/m      Assessment & Plan:  Jared Cruz comes in today with chief complaint of Medical Management of Chronic Issues   Diagnosis and orders addressed:  1. Primary hypertension Pt's BP at home is 157/50's.  -Daily blood pressure log given with instructions on how to fill out and told to bring to next visit -Dash diet information given -Exercise encouraged - Stress Management  -Continue current meds - CMP14+EGFR - CBC with Differential/Platelet  2. Mitral valve disease - CMP14+EGFR - CBC with Differential/Platelet  3. Coronary artery disease involving coronary bypass graft of native heart without angina pectoris - CMP14+EGFR - CBC with Differential/Platelet  4. Uncomplicated asthma, unspecified asthma severity, unspecified whether persistent - CMP14+EGFR - CBC with Differential/Platelet  5. Benign paroxysmal positional vertigo, unspecified laterality - CMP14+EGFR - CBC with Differential/Platelet  6. Chronic kidney disease (CKD), stage IV (severe) (HCC) - CMP14+EGFR - CBC with Differential/Platelet  7. Vitamin D deficiency - CMP14+EGFR - CBC with Differential/Platelet  8. Hyperlipidemia, unspecified hyperlipidemia type - CMP14+EGFR - CBC with Differential/Platelet  9. Refusal of statin medication by patient - CMP14+EGFR - CBC with Differential/Platelet  10. Overweight (BMI 25.0-29.9) - CMP14+EGFR - CBC with Differential/Platelet  11. Bilateral low back pain, unspecified chronicity, unspecified whether sciatica present - CMP14+EGFR - CBC with Differential/Platelet   Labs pending Health Maintenance reviewed Diet and exercise encouraged  Follow up plan: 4 months   Christy Hawks, FNP    

## 2021-03-17 NOTE — Patient Instructions (Signed)

## 2021-03-24 DIAGNOSIS — I129 Hypertensive chronic kidney disease with stage 1 through stage 4 chronic kidney disease, or unspecified chronic kidney disease: Secondary | ICD-10-CM | POA: Diagnosis not present

## 2021-03-24 DIAGNOSIS — N1832 Chronic kidney disease, stage 3b: Secondary | ICD-10-CM | POA: Diagnosis not present

## 2021-03-24 DIAGNOSIS — R7303 Prediabetes: Secondary | ICD-10-CM | POA: Diagnosis not present

## 2021-03-24 DIAGNOSIS — I5032 Chronic diastolic (congestive) heart failure: Secondary | ICD-10-CM | POA: Diagnosis not present

## 2021-04-20 DIAGNOSIS — R7303 Prediabetes: Secondary | ICD-10-CM | POA: Diagnosis not present

## 2021-04-20 DIAGNOSIS — N1832 Chronic kidney disease, stage 3b: Secondary | ICD-10-CM | POA: Diagnosis not present

## 2021-04-20 DIAGNOSIS — I5032 Chronic diastolic (congestive) heart failure: Secondary | ICD-10-CM | POA: Diagnosis not present

## 2021-04-20 DIAGNOSIS — I129 Hypertensive chronic kidney disease with stage 1 through stage 4 chronic kidney disease, or unspecified chronic kidney disease: Secondary | ICD-10-CM | POA: Diagnosis not present

## 2021-04-20 DIAGNOSIS — E875 Hyperkalemia: Secondary | ICD-10-CM | POA: Diagnosis not present

## 2021-04-22 ENCOUNTER — Ambulatory Visit: Payer: Medicare Other | Admitting: Family

## 2021-05-12 ENCOUNTER — Other Ambulatory Visit: Payer: Self-pay | Admitting: Family

## 2021-05-12 DIAGNOSIS — J45909 Unspecified asthma, uncomplicated: Secondary | ICD-10-CM

## 2021-07-04 DIAGNOSIS — N281 Cyst of kidney, acquired: Secondary | ICD-10-CM | POA: Diagnosis not present

## 2021-07-04 DIAGNOSIS — I129 Hypertensive chronic kidney disease with stage 1 through stage 4 chronic kidney disease, or unspecified chronic kidney disease: Secondary | ICD-10-CM | POA: Diagnosis not present

## 2021-07-04 DIAGNOSIS — N1832 Chronic kidney disease, stage 3b: Secondary | ICD-10-CM | POA: Diagnosis not present

## 2021-07-04 DIAGNOSIS — I5032 Chronic diastolic (congestive) heart failure: Secondary | ICD-10-CM | POA: Diagnosis not present

## 2021-07-08 DIAGNOSIS — E875 Hyperkalemia: Secondary | ICD-10-CM | POA: Diagnosis not present

## 2021-07-08 DIAGNOSIS — I129 Hypertensive chronic kidney disease with stage 1 through stage 4 chronic kidney disease, or unspecified chronic kidney disease: Secondary | ICD-10-CM | POA: Diagnosis not present

## 2021-07-08 DIAGNOSIS — I5032 Chronic diastolic (congestive) heart failure: Secondary | ICD-10-CM | POA: Diagnosis not present

## 2021-07-08 DIAGNOSIS — N1832 Chronic kidney disease, stage 3b: Secondary | ICD-10-CM | POA: Diagnosis not present

## 2021-07-18 ENCOUNTER — Encounter: Payer: Self-pay | Admitting: Family

## 2021-07-18 ENCOUNTER — Ambulatory Visit (INDEPENDENT_AMBULATORY_CARE_PROVIDER_SITE_OTHER): Payer: Commercial Managed Care - HMO | Admitting: Family

## 2021-07-18 VITALS — BP 122/69 | HR 103 | Temp 96.1°F | Ht 68.0 in | Wt 189.6 lb

## 2021-07-18 DIAGNOSIS — E663 Overweight: Secondary | ICD-10-CM

## 2021-07-18 DIAGNOSIS — Z5320 Procedure and treatment not carried out because of patient's decision for unspecified reasons: Secondary | ICD-10-CM

## 2021-07-18 DIAGNOSIS — I25810 Atherosclerosis of coronary artery bypass graft(s) without angina pectoris: Secondary | ICD-10-CM | POA: Diagnosis not present

## 2021-07-18 DIAGNOSIS — N400 Enlarged prostate without lower urinary tract symptoms: Secondary | ICD-10-CM

## 2021-07-18 DIAGNOSIS — N184 Chronic kidney disease, stage 4 (severe): Secondary | ICD-10-CM | POA: Diagnosis not present

## 2021-07-18 DIAGNOSIS — Z0000 Encounter for general adult medical examination without abnormal findings: Secondary | ICD-10-CM

## 2021-07-18 DIAGNOSIS — E559 Vitamin D deficiency, unspecified: Secondary | ICD-10-CM | POA: Diagnosis not present

## 2021-07-18 DIAGNOSIS — I059 Rheumatic mitral valve disease, unspecified: Secondary | ICD-10-CM | POA: Diagnosis not present

## 2021-07-18 DIAGNOSIS — I739 Peripheral vascular disease, unspecified: Secondary | ICD-10-CM | POA: Diagnosis not present

## 2021-07-18 DIAGNOSIS — Z0001 Encounter for general adult medical examination with abnormal findings: Secondary | ICD-10-CM | POA: Diagnosis not present

## 2021-07-18 DIAGNOSIS — E785 Hyperlipidemia, unspecified: Secondary | ICD-10-CM | POA: Diagnosis not present

## 2021-07-18 DIAGNOSIS — J45909 Unspecified asthma, uncomplicated: Secondary | ICD-10-CM | POA: Diagnosis not present

## 2021-07-18 DIAGNOSIS — I10 Essential (primary) hypertension: Secondary | ICD-10-CM

## 2021-07-18 DIAGNOSIS — H8110 Benign paroxysmal vertigo, unspecified ear: Secondary | ICD-10-CM | POA: Diagnosis not present

## 2021-07-18 MED ORDER — FINASTERIDE 5 MG PO TABS: 5.0000 mg | ORAL_TABLET | Freq: Every day | ORAL | 2 refills | Status: DC

## 2021-07-18 MED ORDER — ALBUTEROL SULFATE HFA 108 (90 BASE) MCG/ACT IN AERS: INHALATION_SPRAY | RESPIRATORY_TRACT | 2 refills | Status: DC

## 2021-07-18 MED ORDER — MECLIZINE HCL 25 MG PO TABS: ORAL_TABLET | ORAL | 0 refills | Status: DC

## 2021-07-18 NOTE — Progress Notes (Signed)
Subjective:    Patient ID: Jared Cruz, male    DOB: 12-26-1933, 88 y.o.   MRN: [id number]  Chief Complaint  Patient presents with   Medical Management of Chronic Issues   Pt presents to the office today for CPE and chronic follow up. Pt is followed by Cardiologists 6 months for CAD, PVD, and mitral valve prolapse.  He is followed by Nephrologists for CKD. He avoids NSAID's and this is stable.  Hypertension This is a chronic problem. The current episode started more than 1 year ago. The problem has been waxing and waning since onset. The problem is uncontrolled. Pertinent negatives include no malaise/fatigue, peripheral edema or shortness of breath. Risk factors for coronary artery disease include dyslipidemia, obesity and male gender. The current treatment provides moderate improvement.  Asthma He complains of cough and wheezing. There is no shortness of breath. This is a chronic problem. The current episode started more than 1 year ago. The problem occurs intermittently. Pertinent negatives include no malaise/fatigue. His symptoms are aggravated by change in weather. His symptoms are alleviated by rest and beta-agonist. His past medical history is significant for asthma.  Benign Prostatic Hypertrophy This is a chronic problem. The current episode started more than 1 year ago. Irritative symptoms include nocturia (2). Past treatments include finasteride. The treatment provided moderate relief.  Dizziness This is a recurrent problem. The current episode started more than 1 year ago. The problem occurs intermittently. The problem has been waxing and waning. Associated symptoms include coughing.     Review of Systems  Constitutional:  Negative for malaise/fatigue.  Respiratory:  Positive for cough and wheezing. Negative for shortness of breath.   Genitourinary:  Positive for nocturia (2).  Neurological:  Positive for dizziness.  All other systems reviewed and are negative.  Family  History  Problem Relation Age of Onset   Diabetes Mother    Hypertension Mother    Diabetes Sister    Asthma Son    Hypertension Brother    Heart disease Brother    Hypertension Brother    Diabetes Brother    Peripheral vascular disease Sister    Hypertension Son    Social History   Socioeconomic History   Marital status: Legally Separated    Spouse name: Not on file   Number of children: 5   Years of education: 10   Highest education level: 10th grade  Occupational History   Occupation: Retired    Comment: Media planner  Tobacco Use   Smoking status: Never   Smokeless tobacco: Never  Building services engineer Use: Never used  Substance and Sexual Activity   Alcohol use: No   Drug use: No   Sexual activity: Not Currently  Other Topics Concern   Not on file  Social History Narrative   Lives in a house with his his niece. His wife lives in an apartment and he can visit during the day. He is a felon and can't live in the apartments that she's in.        Social Determinants of Health   Financial Resource Strain: Not on file  Food Insecurity: Not on file  Transportation Needs: Not on file  Physical Activity: Not on file  Stress: Not on file  Social Connections: Not on file       Objective:   Physical Exam Vitals reviewed.  Constitutional:      General: He is not in acute distress.    Appearance: He is well-developed.  HENT:     Head: Normocephalic.     Right Ear: Tympanic membrane normal.     Left Ear: Tympanic membrane normal.  Eyes:     General:        Right eye: No discharge.        Left eye: No discharge.     Pupils: Pupils are equal, round, and reactive to light.  Neck:     Thyroid: No thyromegaly.  Cardiovascular:     Rate and Rhythm: Normal rate and regular rhythm.     Heart sounds: Murmur heard.  Pulmonary:     Effort: Pulmonary effort is normal. No respiratory distress.     Breath sounds: Normal breath sounds. No wheezing.  Abdominal:      General: Bowel sounds are normal. There is no distension.     Palpations: Abdomen is soft.     Tenderness: There is no abdominal tenderness.  Musculoskeletal:        General: No tenderness. Normal range of motion.     Cervical back: Normal range of motion and neck supple.  Skin:    General: Skin is warm and dry.     Findings: No erythema or rash.  Neurological:     Mental Status: He is alert and oriented to person, place, and time.     Cranial Nerves: No cranial nerve deficit.     Deep Tendon Reflexes: Reflexes are normal and symmetric.  Psychiatric:        Behavior: Behavior normal.        Thought Content: Thought content normal.        Judgment: Judgment normal.      BP 122/69    Pulse (!) 103    Temp (!) 96.1 F (35.6 C) (Temporal)    Ht $R'5\' 8"'lR$  (1.727 m)    Wt 189 lb 9.6 oz (86 kg)    BMI 28.83 kg/m      Assessment & Plan:  Jared Cruz comes in today with chief complaint of Medical Management of Chronic Issues   Diagnosis and orders addressed:  1. Uncomplicated asthma, unspecified asthma severity, unspecified whether persistent - albuterol (VENTOLIN HFA) 108 (90 Base) MCG/ACT inhaler; 2 PUFFS EVERY 6 HOURS AS NEEDED FOR WHEEZING OR SHORTNESS OF BREATH  Dispense: 18 g; Refill: 2 - CMP14+EGFR - CBC with Differential/Platelet  2. Primary hypertension - CMP14+EGFR - CBC with Differential/Platelet  3. Mitral valve disease - CMP14+EGFR - CBC with Differential/Platelet  4. Peripheral vascular disease ([place]) - CMP14+EGFR - CBC with Differential/Platelet  5. Coronary artery disease involving coronary bypass graft of native heart without angina pectoris - CMP14+EGFR - CBC with Differential/Platelet  6. Benign paroxysmal positional vertigo, unspecified laterality - CMP14+EGFR - CBC with Differential/Platelet  7. Chronic kidney disease (CKD), stage IV (severe) ([facility]CC) - CMP14+EGFR - CBC with Differential/Platelet  8. Benign prostatic hyperplasia without lower  urinary tract symptoms  - CMP14+EGFR - CBC with Differential/Platelet  9. Hyperlipidemia, unspecified hyperlipidemia type - CMP14+EGFR - CBC with Differential/Platelet - Lipid panel  10. Vitamin D deficiency - CMP14+EGFR - CBC with Differential/Platelet - VITAMIN D 25 Hydroxy (Vit-D Deficiency, Fractures)  11. Refusal of statin medication by patient  - CMP14+EGFR - CBC with Differential/Platelet  12. Overweight (BMI 25.0-29.9) - CMP14+EGFR - CBC with Differential/Platelet  13. Annual physical exam - CMP14+EGFR - CBC with Differential/Platelet - Lipid panel - TSH - VITAMIN D 25 Hydroxy (Vit-D Deficiency, Fractures)   Labs pending Health Maintenance reviewed Diet and exercise encouraged  Follow  up plan: 6 months    Evelina Dun, FNP

## 2021-07-18 NOTE — Patient Instructions (Signed)

## 2021-07-19 LAB — CMP14+EGFR
ALT: 10 IU/L (ref 0–44)
AST: 15 IU/L (ref 0–40)
Albumin/Globulin Ratio: 1.7 (ref 1.2–2.2)
Albumin: 4.7 g/dL — ABNORMAL HIGH (ref 3.6–4.6)
Alkaline Phosphatase: 51 IU/L (ref 44–121)
BUN/Creatinine Ratio: 13 (ref 10–24)
BUN: 23 mg/dL (ref 8–27)
Bilirubin Total: 0.4 mg/dL (ref 0.0–1.2)
CO2: 24 mmol/L (ref 20–29)
Calcium: 10 mg/dL (ref 8.6–10.2)
Chloride: 106 mmol/L (ref 96–106)
Creatinine, Ser: 1.77 mg/dL — ABNORMAL HIGH (ref 0.76–1.27)
Globulin, Total: 2.8 g/dL (ref 1.5–4.5)
Glucose: 107 mg/dL — ABNORMAL HIGH (ref 70–99)
Potassium: 4.7 mmol/L (ref 3.5–5.2)
Sodium: 146 mmol/L — ABNORMAL HIGH (ref 134–144)
Total Protein: 7.5 g/dL (ref 6.0–8.5)
eGFR: 37 mL/min/{1.73_m2} — ABNORMAL LOW (ref >59)

## 2021-07-19 LAB — CBC WITH DIFFERENTIAL/PLATELET
Basophils Absolute: 0.1 10*3/uL (ref 0.0–0.2)
Basos: 1 %
EOS (ABSOLUTE): 0.2 10*3/uL (ref 0.0–0.4)
Eos: 3 %
Hematocrit: 43.8 % (ref 37.5–51.0)
Hemoglobin: 14.7 g/dL (ref 13.0–17.7)
Immature Grans (Abs): 0 10*3/uL (ref 0.0–0.1)
Immature Granulocytes: 0 %
Lymphocytes Absolute: 2.7 10*3/uL (ref 0.7–3.1)
Lymphs: 38 %
MCH: 29.9 pg (ref 26.6–33.0)
MCHC: 33.6 g/dL (ref 31.5–35.7)
MCV: 89 fL (ref 79–97)
Monocytes Absolute: 0.7 10*3/uL (ref 0.1–0.9)
Monocytes: 10 %
Neutrophils Absolute: 3.4 10*3/uL (ref 1.4–7.0)
Neutrophils: 48 %
Platelets: 235 10*3/uL (ref 150–450)
RBC: 4.91 x10E6/uL (ref 4.14–5.80)
RDW: 13.5 % (ref 11.6–15.4)
WBC: 7 10*3/uL (ref 3.4–10.8)

## 2021-07-19 LAB — TSH: TSH: 1.98 u[IU]/mL (ref 0.450–4.500)

## 2021-07-19 LAB — LIPID PANEL
Chol/HDL Ratio: 5.2 ratio — ABNORMAL HIGH (ref 0.0–5.0)
Cholesterol, Total: 274 mg/dL — ABNORMAL HIGH (ref 100–199)
HDL: 53 mg/dL (ref >39)
LDL Chol Calc (NIH): 197 mg/dL — ABNORMAL HIGH (ref 0–99)
Triglycerides: 132 mg/dL (ref 0–149)
VLDL Cholesterol Cal: 24 mg/dL (ref 5–40)

## 2021-07-19 LAB — VITAMIN D 25 HYDROXY (VIT D DEFICIENCY, FRACTURES): Vit D, 25-Hydroxy: 33.2 ng/mL (ref 30.0–100.0)

## 2021-08-08 ENCOUNTER — Ambulatory Visit (INDEPENDENT_AMBULATORY_CARE_PROVIDER_SITE_OTHER): Payer: Medicare Other

## 2021-08-08 VITALS — Ht 68.0 in | Wt 189.0 lb

## 2021-08-08 DIAGNOSIS — Z0000 Encounter for general adult medical examination without abnormal findings: Secondary | ICD-10-CM

## 2021-08-08 NOTE — Patient Instructions (Signed)
Jared Cruz , Thank you for taking time to come for your Medicare Wellness Visit. I appreciate your ongoing commitment to your health goals. Please review the following plan we discussed and let me know if I can assist you in the future.   Screening recommendations/referrals: Colonoscopy: No longer required due to age.  Recommended yearly ophthalmology/optometry visit for glaucoma screening and checkup Recommended yearly dental visit for hygiene and checkup  Vaccinations: Influenza vaccine: Done 03/09/2021 Repeat annually  Pneumococcal vaccine: Done 08/11/2011 and 11/25/2013 Tdap vaccine: Done 12/06/2017 Repeat in 10 years  Shingles vaccine: Done 06/12/98, 05/30/2018 and 12/14/2020.   Covid-19: Done 08/10/2019, 09/10/2019, 07/06/2020 and 11/30/2020.  Advanced directives: Advance directive discussed with you today. Even though you declined this today, please call our office should you change your mind, and we can give you the proper paperwork for you to fill out.   Conditions/risks identified: Aim for 30 minutes of exercise or brisk walking, 6-8 glasses of water, and 5 servings of fruits and vegetables each day.   Next appointment: Follow up in one year for your annual wellness visit. 2024.  Preventive Care 86 Years and Older, Male  Preventive care refers to lifestyle choices and visits with your health care provider that can promote health and wellness. What does preventive care include? A yearly physical exam. This is also called an annual well check. Dental exams once or twice a year. Routine eye exams. Ask your health care provider how often you should have your eyes checked. Personal lifestyle choices, including: Daily care of your teeth and gums. Regular physical activity. Eating a healthy diet. Avoiding tobacco and drug use. Limiting alcohol use. Practicing safe sex. Taking low doses of aspirin every day. Taking vitamin and mineral supplements as recommended by your health care  provider. What happens during an annual well check? The services and screenings done by your health care provider during your annual well check will depend on your age, overall health, lifestyle risk factors, and family history of disease. Counseling  Your health care provider may ask you questions about your: Alcohol use. Tobacco use. Drug use. Emotional well-being. Home and relationship well-being. Sexual activity. Eating habits. History of falls. Memory and ability to understand (cognition). Work and work Statistician. Screening  You may have the following tests or measurements: Height, weight, and BMI. Blood pressure. Lipid and cholesterol levels. These may be checked every 5 years, or more frequently if you are over 24 years old. Skin check. Lung cancer screening. You may have this screening every year starting at age 86 if you have a 30-pack-year history of smoking and currently smoke or have quit within the past 15 years. Fecal occult blood test (FOBT) of the stool. You may have this test every year starting at age 86. Flexible sigmoidoscopy or colonoscopy. You may have a sigmoidoscopy every 5 years or a colonoscopy every 10 years starting at age 86. Prostate cancer screening. Recommendations will vary depending on your family history and other risks. Hepatitis C blood test. Hepatitis B blood test. Sexually transmitted disease (STD) testing. Diabetes screening. This is done by checking your blood sugar (glucose) after you have not eaten for a while (fasting). You may have this done every 1-3 years. Abdominal aortic aneurysm (AAA) screening. You may need this if you are a current or former smoker. Osteoporosis. You may be screened starting at age 86 if you are at high risk. Talk with your health care provider about your test results, treatment options, and if necessary,  the need for more tests. Vaccines  Your health care provider may recommend certain vaccines, such  as: Influenza vaccine. This is recommended every year. Tetanus, diphtheria, and acellular pertussis (Tdap, Td) vaccine. You may need a Td booster every 10 years. Zoster vaccine. You may need this after age 86. Pneumococcal 13-valent conjugate (PCV13) vaccine. One dose is recommended after age 86. Pneumococcal polysaccharide (PPSV23) vaccine. One dose is recommended after age 86. Talk to your health care provider about which screenings and vaccines you need and how often you need them. This information is not intended to replace advice given to you by your health care provider. Make sure you discuss any questions you have with your health care provider. Document Released: 06/25/2015 Document Revised: 02/16/2016 Document Reviewed: 03/30/2015 Elsevier Interactive Patient Education  2017 [employer] Prevention in the Home Falls can cause injuries. They can happen to people of all ages. There are many things you can do to make your home safe and to help prevent falls. What can I do on the outside of my home? Regularly fix the edges of walkways and driveways and fix any cracks. Remove anything that might make you trip as you walk through a door, such as a raised step or threshold. Trim any bushes or trees on the path to your home. Use bright outdoor lighting. Clear any walking paths of anything that might make someone trip, such as rocks or tools. Regularly check to see if handrails are loose or broken. Make sure that both sides of any steps have handrails. Any raised decks and porches should have guardrails on the edges. Have any leaves, snow, or ice cleared regularly. Use sand or salt on walking paths during winter. Clean up any spills in your garage right away. This includes oil or grease spills. What can I do in the bathroom? Use night lights. Install grab bars by the toilet and in the tub and shower. Do not use towel bars as grab bars. Use non-skid mats or decals in the tub or  shower. If you need to sit down in the shower, use a plastic, non-slip stool. Keep the floor dry. Clean up any water that spills on the floor as soon as it happens. Remove soap buildup in the tub or shower regularly. Attach bath mats securely with double-sided non-slip rug tape. Do not have throw rugs and other things on the floor that can make you trip. What can I do in the bedroom? Use night lights. Make sure that you have a light by your bed that is easy to reach. Do not use any sheets or blankets that are too big for your bed. They should not hang down onto the floor. Have a firm chair that has side arms. You can use this for support while you get dressed. Do not have throw rugs and other things on the floor that can make you trip. What can I do in the kitchen? Clean up any spills right away. Avoid walking on wet floors. Keep items that you use a lot in easy-to-reach places. If you need to reach something above you, use a strong step stool that has a grab bar. Keep electrical cords out of the way. Do not use floor polish or wax that makes floors slippery. If you must use wax, use non-skid floor wax. Do not have throw rugs and other things on the floor that can make you trip. What can I do with my stairs? Do not leave any items on  the stairs. Make sure that there are handrails on both sides of the stairs and use them. Fix handrails that are broken or loose. Make sure that handrails are as long as the stairways. Check any carpeting to make sure that it is firmly attached to the stairs. Fix any carpet that is loose or worn. Avoid having throw rugs at the top or bottom of the stairs. If you do have throw rugs, attach them to the floor with carpet tape. Make sure that you have a light switch at the top of the stairs and the bottom of the stairs. If you do not have them, ask someone to add them for you. What else can I do to help prevent falls? Wear shoes that: Do not have high heels. Have  rubber bottoms. Are comfortable and fit you well. Are closed at the toe. Do not wear sandals. If you use a stepladder: Make sure that it is fully opened. Do not climb a closed stepladder. Make sure that both sides of the stepladder are locked into place. Ask someone to hold it for you, if possible. Clearly mark and make sure that you can see: Any grab bars or handrails. First and last steps. Where the edge of each step is. Use tools that help you move around (mobility aids) if they are needed. These include: Canes. Walkers. Scooters. Crutches. Turn on the lights when you go into a dark area. Replace any light bulbs as soon as they burn out. Set up your furniture so you have a clear path. Avoid moving your furniture around. If any of your floors are uneven, fix them. If there are any pets around you, be aware of where they are. Review your medicines with your doctor. Some medicines can make you feel dizzy. This can increase your chance of falling. Ask your doctor what other things that you can do to help prevent falls. This information is not intended to replace advice given to you by your health care provider. Make sure you discuss any questions you have with your health care provider. Document Released: 03/25/2009 Document Revised: 11/04/2015 Document Reviewed: 07/03/2014 Elsevier Interactive Patient Education  2017 [employer].
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
## 2021-09-13 ENCOUNTER — Telehealth: Payer: Self-pay | Admitting: Family

## 2021-09-13 ENCOUNTER — Other Ambulatory Visit: Payer: Self-pay | Admitting: Family

## 2021-09-13 DIAGNOSIS — J45909 Unspecified asthma, uncomplicated: Secondary | ICD-10-CM

## 2021-09-13 MED ORDER — ALBUTEROL SULFATE HFA 108 (90 BASE) MCG/ACT IN AERS: INHALATION_SPRAY | RESPIRATORY_TRACT | 2 refills | Status: DC

## 2021-09-13 MED ORDER — ALBUTEROL SULFATE (2.5 MG/3ML) 0.083% IN NEBU: INHALATION_SOLUTION | RESPIRATORY_TRACT | 2 refills | Status: DC

## 2021-09-13 NOTE — Telephone Encounter (Signed)
Aware refill sent to pharmacy ?

## 2021-09-13 NOTE — Telephone Encounter (Signed)
?  Prescription Request ? ?09/13/2021 ? ?Is this a "Controlled Substance" medicine? no ? ?Have you seen your PCP in the last 2 weeks? no ? ?If YES, route message to pool  -  If NO, patient needs to be scheduled for appointment. ? ?What is the name of the medication or equipment? albuterol (PROVENTIL) (2.5 MG/3ML) 0.083% nebulizer solution ? ?Have you contacted your pharmacy to request a refill? yes  ? ?Which pharmacy would you like this sent to? [facility] ? ? ?Patient notified that their request is being sent to the clinical staff for review and that they should receive a response within 2 business days.  ?  ?

## 2021-09-21 DIAGNOSIS — E875 Hyperkalemia: Secondary | ICD-10-CM | POA: Diagnosis not present

## 2021-09-21 DIAGNOSIS — E559 Vitamin D deficiency, unspecified: Secondary | ICD-10-CM | POA: Diagnosis not present

## 2021-09-21 DIAGNOSIS — I129 Hypertensive chronic kidney disease with stage 1 through stage 4 chronic kidney disease, or unspecified chronic kidney disease: Secondary | ICD-10-CM | POA: Diagnosis not present

## 2021-09-21 DIAGNOSIS — N1832 Chronic kidney disease, stage 3b: Secondary | ICD-10-CM | POA: Diagnosis not present

## 2021-09-21 DIAGNOSIS — I5032 Chronic diastolic (congestive) heart failure: Secondary | ICD-10-CM | POA: Diagnosis not present

## 2021-09-28 DIAGNOSIS — I129 Hypertensive chronic kidney disease with stage 1 through stage 4 chronic kidney disease, or unspecified chronic kidney disease: Secondary | ICD-10-CM | POA: Diagnosis not present

## 2021-09-28 DIAGNOSIS — I5032 Chronic diastolic (congestive) heart failure: Secondary | ICD-10-CM | POA: Diagnosis not present

## 2021-09-28 DIAGNOSIS — E870 Hyperosmolality and hypernatremia: Secondary | ICD-10-CM | POA: Diagnosis not present

## 2021-09-28 DIAGNOSIS — N1832 Chronic kidney disease, stage 3b: Secondary | ICD-10-CM | POA: Diagnosis not present

## 2021-09-28 DIAGNOSIS — D472 Monoclonal gammopathy: Secondary | ICD-10-CM | POA: Diagnosis not present

## 2021-10-18 ENCOUNTER — Other Ambulatory Visit: Payer: Self-pay | Admitting: Family

## 2021-12-20 ENCOUNTER — Other Ambulatory Visit: Payer: Self-pay | Admitting: Family

## 2021-12-20 DIAGNOSIS — I10 Essential (primary) hypertension: Secondary | ICD-10-CM

## 2021-12-20 DIAGNOSIS — E870 Hyperosmolality and hypernatremia: Secondary | ICD-10-CM | POA: Diagnosis not present

## 2021-12-20 DIAGNOSIS — I5032 Chronic diastolic (congestive) heart failure: Secondary | ICD-10-CM | POA: Diagnosis not present

## 2021-12-20 DIAGNOSIS — N1832 Chronic kidney disease, stage 3b: Secondary | ICD-10-CM | POA: Diagnosis not present

## 2021-12-20 DIAGNOSIS — I129 Hypertensive chronic kidney disease with stage 1 through stage 4 chronic kidney disease, or unspecified chronic kidney disease: Secondary | ICD-10-CM | POA: Diagnosis not present

## 2021-12-29 DIAGNOSIS — N1832 Chronic kidney disease, stage 3b: Secondary | ICD-10-CM | POA: Diagnosis not present

## 2021-12-29 DIAGNOSIS — R7303 Prediabetes: Secondary | ICD-10-CM | POA: Diagnosis not present

## 2021-12-29 DIAGNOSIS — D472 Monoclonal gammopathy: Secondary | ICD-10-CM | POA: Diagnosis not present

## 2021-12-29 DIAGNOSIS — I129 Hypertensive chronic kidney disease with stage 1 through stage 4 chronic kidney disease, or unspecified chronic kidney disease: Secondary | ICD-10-CM | POA: Diagnosis not present

## 2021-12-29 DIAGNOSIS — I5032 Chronic diastolic (congestive) heart failure: Secondary | ICD-10-CM | POA: Diagnosis not present

## 2022-01-16 ENCOUNTER — Encounter: Payer: Self-pay | Admitting: Family

## 2022-01-16 ENCOUNTER — Ambulatory Visit (INDEPENDENT_AMBULATORY_CARE_PROVIDER_SITE_OTHER): Payer: Medicare Other | Admitting: Family

## 2022-01-16 VITALS — BP 127/68 | HR 99 | Temp 97.8°F | Ht 68.0 in | Wt 188.2 lb

## 2022-01-16 DIAGNOSIS — H8110 Benign paroxysmal vertigo, unspecified ear: Secondary | ICD-10-CM

## 2022-01-16 DIAGNOSIS — I739 Peripheral vascular disease, unspecified: Secondary | ICD-10-CM | POA: Diagnosis not present

## 2022-01-16 DIAGNOSIS — I25810 Atherosclerosis of coronary artery bypass graft(s) without angina pectoris: Secondary | ICD-10-CM | POA: Diagnosis not present

## 2022-01-16 DIAGNOSIS — Z5320 Procedure and treatment not carried out because of patient's decision for unspecified reasons: Secondary | ICD-10-CM

## 2022-01-16 DIAGNOSIS — J45909 Unspecified asthma, uncomplicated: Secondary | ICD-10-CM | POA: Diagnosis not present

## 2022-01-16 DIAGNOSIS — N184 Chronic kidney disease, stage 4 (severe): Secondary | ICD-10-CM | POA: Diagnosis not present

## 2022-01-16 DIAGNOSIS — I10 Essential (primary) hypertension: Secondary | ICD-10-CM

## 2022-01-16 DIAGNOSIS — M545 Low back pain, unspecified: Secondary | ICD-10-CM

## 2022-01-16 DIAGNOSIS — E785 Hyperlipidemia, unspecified: Secondary | ICD-10-CM

## 2022-01-16 DIAGNOSIS — E663 Overweight: Secondary | ICD-10-CM

## 2022-01-16 MED ORDER — FINASTERIDE 5 MG PO TABS: 5.0000 mg | ORAL_TABLET | Freq: Every day | ORAL | 2 refills | Status: DC

## 2022-01-16 MED ORDER — MECLIZINE HCL 25 MG PO TABS: ORAL_TABLET | ORAL | 0 refills | Status: DC

## 2022-01-16 MED ORDER — LOSARTAN POTASSIUM 50 MG PO TABS: 50.0000 mg | ORAL_TABLET | Freq: Every day | ORAL | 0 refills | Status: DC

## 2022-01-16 MED ORDER — EPINEPHRINE 0.3 MG/0.3ML IJ SOAJ: INTRAMUSCULAR | 3 refills | Status: AC

## 2022-01-16 MED ORDER — MONTELUKAST SODIUM 10 MG PO TABS
10.0000 mg | ORAL_TABLET | Freq: Every day | ORAL | 1 refills | Status: DC
Start: 2022-01-16 — End: 2022-11-27

## 2022-01-16 MED ORDER — ALBUTEROL SULFATE (2.5 MG/3ML) 0.083% IN NEBU
INHALATION_SOLUTION | RESPIRATORY_TRACT | 2 refills | Status: DC
Start: 2022-01-16 — End: 2023-10-15

## 2022-01-16 MED ORDER — FENOFIBRATE 48 MG PO TABS
48.0000 mg | ORAL_TABLET | Freq: Every day | ORAL | 1 refills | Status: DC
Start: 2022-01-16 — End: 2023-01-09

## 2022-01-16 MED ORDER — AMLODIPINE BESYLATE 5 MG PO TABS: 5.0000 mg | ORAL_TABLET | Freq: Every day | ORAL | 1 refills | Status: DC

## 2022-01-16 MED ORDER — ALBUTEROL SULFATE HFA 108 (90 BASE) MCG/ACT IN AERS
INHALATION_SPRAY | RESPIRATORY_TRACT | 2 refills | Status: DC
Start: 2022-01-16 — End: 2022-07-31

## 2022-01-16 NOTE — Patient Instructions (Signed)
Hypertension, Adult High blood pressure (hypertension) is when the force of blood pumping through the arteries is too strong. The arteries are the blood vessels that carry blood from the heart throughout the body. Hypertension forces the heart to work harder to pump blood and may cause arteries to become narrow or stiff. Untreated or uncontrolled hypertension can lead to a heart attack, heart failure, a stroke, kidney disease, and other problems. A blood pressure reading consists of a higher number over a lower number. Ideally, your blood pressure should be below 120/80. The first ("top") number is called the systolic pressure. It is a measure of the pressure in your arteries as your heart beats. The second ("bottom") number is called the diastolic pressure. It is a measure of the pressure in your arteries as the heart relaxes. What are the causes? The exact cause of this condition is not known. There are some conditions that result in high blood pressure. What increases the risk? Certain factors may make you more likely to develop high blood pressure. Some of these risk factors are under your control, including: Smoking. Not getting enough exercise or physical activity. Being overweight. Having too much fat, sugar, calories, or salt (sodium) in your diet. Drinking too much alcohol. Other risk factors include: Having a personal history of heart disease, diabetes, high cholesterol, or kidney disease. Stress. Having a family history of high blood pressure and high cholesterol. Having obstructive sleep apnea. Age. The risk increases with age. What are the signs or symptoms? High blood pressure may not cause symptoms. Very high blood pressure (hypertensive crisis) may cause: Headache. Fast or irregular heartbeats (palpitations). Shortness of breath. Nosebleed. Nausea and vomiting. Vision changes. Severe chest pain, dizziness, and seizures. How is this diagnosed? This condition is diagnosed by  measuring your blood pressure while you are seated, with your arm resting on a flat surface, your legs uncrossed, and your feet flat on the floor. The cuff of the blood pressure monitor will be placed directly against the skin of your upper arm at the level of your heart. Blood pressure should be measured at least twice using the same arm. Certain conditions can cause a difference in blood pressure between your right and left arms. If you have a high blood pressure reading during one visit or you have normal blood pressure with other risk factors, you may be asked to: Return on a different day to have your blood pressure checked again. Monitor your blood pressure at home for 1 week or longer. If you are diagnosed with hypertension, you may have other blood or imaging tests to help your health care provider understand your overall risk for other conditions. How is this treated? This condition is treated by making healthy lifestyle changes, such as eating healthy foods, exercising more, and reducing your alcohol intake. You may be referred for counseling on a healthy diet and physical activity. Your health care provider may prescribe medicine if lifestyle changes are not enough to get your blood pressure under control and if: Your systolic blood pressure is above 130. Your diastolic blood pressure is above 80. Your personal target blood pressure may vary depending on your medical conditions, your age, and other factors. Follow these instructions at home: Eating and drinking  Eat a diet that is high in fiber and potassium, and low in sodium, added sugar, and fat. An example of this eating plan is called the DASH diet. DASH stands for Dietary Approaches to Stop Hypertension. To eat this way: Eat   plenty of fresh fruits and vegetables. Try to fill one half of your plate at each meal with fruits and vegetables. Eat whole grains, such as whole-wheat pasta, brown rice, or whole-grain bread. Fill about one  fourth of your plate with whole grains. Eat or drink low-fat dairy products, such as skim milk or low-fat yogurt. Avoid fatty cuts of meat, processed or cured meats, and poultry with skin. Fill about one fourth of your plate with lean proteins, such as fish, chicken without skin, beans, eggs, or tofu. Avoid pre-made and processed foods. These tend to be higher in sodium, added sugar, and fat. Reduce your daily sodium intake. Many people with hypertension should eat less than 1,500 mg of sodium a day. Do not drink alcohol if: Your health care provider tells you not to drink. You are pregnant, may be pregnant, or are planning to become pregnant. If you drink alcohol: Limit how much you have to: 0-1 drink a day for women. 0-2 drinks a day for men. Know how much alcohol is in your drink. In the [place] one drink equals one 12 oz bottle of beer (355 mL), one 5 oz glass of wine (148 mL), or one 1 oz glass of hard liquor (44 mL). Lifestyle  Work with your health care provider to maintain a healthy body weight or to lose weight. Ask what an ideal weight is for you. Get at least 30 minutes of exercise that causes your heart to beat faster (aerobic exercise) most days of the week. Activities may include walking, swimming, or biking. Include exercise to strengthen your muscles (resistance exercise), such as Pilates or lifting weights, as part of your weekly exercise routine. Try to do these types of exercises for 30 minutes at least 3 days a week. Do not use any products that contain nicotine or tobacco. These products include cigarettes, chewing tobacco, and vaping devices, such as e-cigarettes. If you need help quitting, ask your health care provider. Monitor your blood pressure at home as told by your health care provider. Keep all follow-up visits. This is important. Medicines Take over-the-counter and prescription medicines only as told by your health care provider. Follow directions carefully. Blood  pressure medicines must be taken as prescribed. Do not skip doses of blood pressure medicine. Doing this puts you at risk for problems and can make the medicine less effective. Ask your health care provider about side effects or reactions to medicines that you should watch for. Contact a health care provider if you: Think you are having a reaction to a medicine you are taking. Have headaches that keep coming back (recurring). Feel dizzy. Have swelling in your ankles. Have trouble with your vision. Get help right away if you: Develop a severe headache or confusion. Have unusual weakness or numbness. Feel faint. Have severe pain in your chest or abdomen. Vomit repeatedly. Have trouble breathing. These symptoms may be an emergency. Get help right away. Call 911. Do not wait to see if the symptoms will go away. Do not drive yourself to the hospital. Summary Hypertension is when the force of blood pumping through your arteries is too strong. If this condition is not controlled, it may put you at risk for serious complications. Your personal target blood pressure may vary depending on your medical conditions, your age, and other factors. For most people, a normal blood pressure is less than 120/80. Hypertension is treated with lifestyle changes, medicines, or a combination of both. Lifestyle changes include losing weight, eating a healthy,   low-sodium diet, exercising more, and limiting alcohol. This information is not intended to replace advice given to you by your health care provider. Make sure you discuss any questions you have with your health care provider. Document Revised: 04/05/2021 Document Reviewed: 04/05/2021 Elsevier Patient Education  2023 Elsevier Inc.  

## 2022-01-16 NOTE — Progress Notes (Addendum)
Subjective:    Patient ID: Jared Cruz, male    DOB: [birth year], 88 y.o.   MRN: [id number]  Chief Complaint  Patient presents with   Medical Management of Chronic Issues   Pt presents to the office today for CPE and chronic follow up. Pt is followed by Cardiologists 6 months for CAD, PVD, and mitral valve prolapse.  He is followed by Nephrologists for CKD. He avoids NSAID's and this is stable.  Hypertension This is a chronic problem. The current episode started more than 1 year ago. The problem has been waxing and waning since onset. The problem is uncontrolled. Associated symptoms include malaise/fatigue. Pertinent negatives include no peripheral edema or shortness of breath. Risk factors for coronary artery disease include dyslipidemia, obesity and male gender. The current treatment provides mild improvement. Hypertensive end-organ damage includes kidney disease, CAD/MI and heart failure.  Benign Prostatic Hypertrophy This is a chronic problem. The current episode started more than 1 year ago. The problem has been waxing and waning since onset. Irritative symptoms include nocturia (3). Obstructive symptoms do not include an intermittent stream, a slower stream or straining. Past treatments include tamsulosin. The treatment provided moderate relief.  Hyperlipidemia This is a chronic problem. The current episode started more than 1 year ago. The problem is controlled. He has no history of obesity. Pertinent negatives include no shortness of breath. Current antihyperlipidemic treatment includes fibric acid derivatives. The current treatment provides moderate improvement of lipids. Risk factors for coronary artery disease include dyslipidemia, male sex, hypertension and a sedentary lifestyle.  Back Pain This is a chronic problem. The current episode started more than 1 year ago. The problem occurs intermittently. The problem has been waxing and waning since onset. The pain is present in the  lumbar spine. The pain is at a severity of 5/10. The pain is moderate. He has tried bed rest and walking for the symptoms. The treatment provided moderate relief.  Asthma He complains of wheezing. There is no cough or shortness of breath. This is a chronic problem. The current episode started more than 1 year ago. The problem occurs intermittently. The problem has been waxing and waning. Associated symptoms include malaise/fatigue. He reports moderate improvement on treatment. His symptoms are not alleviated by leukotriene antagonist. His past medical history is significant for asthma.      Review of Systems  Constitutional:  Positive for malaise/fatigue.  Respiratory:  Positive for wheezing. Negative for cough and shortness of breath.   Genitourinary:  Positive for nocturia (3).  Musculoskeletal:  Positive for back pain.  All other systems reviewed and are negative.      Objective:   Physical Exam Vitals reviewed.  Constitutional:      General: He is not in acute distress.    Appearance: He is well-developed.  HENT:     Head: Normocephalic.     Right Ear: Tympanic membrane normal.     Left Ear: Tympanic membrane normal.  Eyes:     General:        Right eye: No discharge.        Left eye: No discharge.     Pupils: Pupils are equal, round, and reactive to light.  Neck:     Thyroid: No thyromegaly.  Cardiovascular:     Rate and Rhythm: Normal rate and regular rhythm.     Heart sounds: Murmur heard.  Pulmonary:     Effort: Pulmonary effort is normal. No respiratory distress.     Breath sounds:  Normal breath sounds. No wheezing.  Abdominal:     General: Bowel sounds are normal. There is no distension.     Palpations: Abdomen is soft.     Tenderness: There is no abdominal tenderness.  Musculoskeletal:        General: No tenderness. Normal range of motion.     Cervical back: Normal range of motion and neck supple.  Skin:    General: Skin is warm and dry.     Findings: No  erythema or rash.  Neurological:     Mental Status: He is alert and oriented to person, place, and time.     Cranial Nerves: No cranial nerve deficit.     Deep Tendon Reflexes: Reflexes are normal and symmetric.  Psychiatric:        Behavior: Behavior normal.        Thought Content: Thought content normal.        Judgment: Judgment normal.       BP (!) 146/68   Pulse 99   Temp 97.8 F (36.6 C) (Temporal)   Ht '5\' 8"'$  (1.727 m)   Wt 188 lb 3.2 oz (85.4 kg)   SpO2 98%   BMI 28.62 kg/m      Assessment & Plan:  Jared Cruz comes in today with chief complaint of Medical Management of Chronic Issues   Diagnosis and orders addressed:  1. Essential hypertension  - losartan (COZAAR) 50 MG tablet; Take 1 tablet (50 mg total) by mouth daily.  Dispense: 90 tablet; Refill: 0  2. Hyperlipidemia, unspecified hyperlipidemia type - fenofibrate (TRICOR) 48 MG tablet; Take 1 tablet (48 mg total) by mouth daily.  Dispense: 90 tablet; Refill: 1  3. Uncomplicated asthma, unspecified asthma severity, unspecified whether persistent  - montelukast (SINGULAIR) 10 MG tablet; Take 1 tablet (10 mg total) by mouth daily.  Dispense: 90 tablet; Refill: 1 - albuterol (VENTOLIN HFA) 108 (90 Base) MCG/ACT inhaler; 2 PUFFS EVERY 6 HOURS AS NEEDED FOR WHEEZING OR SHORTNESS OF BREATH  Dispense: 18 g; Refill: 2 - albuterol (PROVENTIL) (2.5 MG/3ML) 0.083% nebulizer solution; NEBULIZE 1 VIAL TWICE A DAY  Dispense: 180 mL; Refill: 2  4. Coronary artery disease involving coronary bypass graft of native heart without angina pectoris  5. Primary hypertension  6. Peripheral vascular disease ([place])  7. Benign paroxysmal positional vertigo, unspecified laterality  8. Chronic kidney disease (CKD), stage IV (severe) ([place])  9. Refusal of statin medication by patient   10. Overweight (BMI 25.0-29.9)  11. Bilateral low back pain, unspecified chronicity, unspecified whether sciatica present   Labs reviewed  from Nephrologists  BP at home this morning was normal  Health Maintenance reviewed Diet and exercise encouraged  Follow up plan: 6 months   Evelina Dun, FNP

## 2022-06-16 ENCOUNTER — Other Ambulatory Visit: Payer: Self-pay | Admitting: Family

## 2022-06-16 DIAGNOSIS — I10 Essential (primary) hypertension: Secondary | ICD-10-CM

## 2022-07-10 DIAGNOSIS — R69 Illness, unspecified: Secondary | ICD-10-CM | POA: Diagnosis not present

## 2022-07-10 DIAGNOSIS — R5381 Other malaise: Secondary | ICD-10-CM | POA: Diagnosis not present

## 2022-07-20 ENCOUNTER — Encounter: Payer: Self-pay | Admitting: Family

## 2022-07-20 ENCOUNTER — Ambulatory Visit (INDEPENDENT_AMBULATORY_CARE_PROVIDER_SITE_OTHER): Payer: 59 | Admitting: Family

## 2022-07-20 VITALS — BP 133/74 | HR 94 | Temp 97.0°F | Ht 68.0 in | Wt 196.3 lb

## 2022-07-20 DIAGNOSIS — J45909 Unspecified asthma, uncomplicated: Secondary | ICD-10-CM

## 2022-07-20 DIAGNOSIS — M545 Low back pain, unspecified: Secondary | ICD-10-CM | POA: Diagnosis not present

## 2022-07-20 DIAGNOSIS — N184 Chronic kidney disease, stage 4 (severe): Secondary | ICD-10-CM | POA: Diagnosis not present

## 2022-07-20 DIAGNOSIS — I059 Rheumatic mitral valve disease, unspecified: Secondary | ICD-10-CM | POA: Diagnosis not present

## 2022-07-20 DIAGNOSIS — I129 Hypertensive chronic kidney disease with stage 1 through stage 4 chronic kidney disease, or unspecified chronic kidney disease: Secondary | ICD-10-CM | POA: Diagnosis not present

## 2022-07-20 DIAGNOSIS — N400 Enlarged prostate without lower urinary tract symptoms: Secondary | ICD-10-CM

## 2022-07-20 DIAGNOSIS — Z0001 Encounter for general adult medical examination with abnormal findings: Secondary | ICD-10-CM

## 2022-07-20 DIAGNOSIS — E559 Vitamin D deficiency, unspecified: Secondary | ICD-10-CM | POA: Diagnosis not present

## 2022-07-20 DIAGNOSIS — Z0000 Encounter for general adult medical examination without abnormal findings: Secondary | ICD-10-CM

## 2022-07-20 DIAGNOSIS — E663 Overweight: Secondary | ICD-10-CM

## 2022-07-20 DIAGNOSIS — I739 Peripheral vascular disease, unspecified: Secondary | ICD-10-CM | POA: Diagnosis not present

## 2022-07-20 DIAGNOSIS — I10 Essential (primary) hypertension: Secondary | ICD-10-CM

## 2022-07-20 DIAGNOSIS — I25810 Atherosclerosis of coronary artery bypass graft(s) without angina pectoris: Secondary | ICD-10-CM | POA: Diagnosis not present

## 2022-07-20 DIAGNOSIS — E785 Hyperlipidemia, unspecified: Secondary | ICD-10-CM | POA: Diagnosis not present

## 2022-07-20 NOTE — Progress Notes (Signed)
Subjective:    Patient ID: Jared Cruz, male    DOB: June 12, 1934, 89 y.o.   MRN: [id number]  Chief Complaint  Patient presents with   Medical Management of Chronic Issues   Pt presents to the office today for  CPE and chronic follow up. Pt is followed by Cardiologists 6 months for CAD, PVD, and mitral valve prolapse.  He is followed by Nephrologists for CKD. He avoids NSAID's and this is stable.   Hypertension This is a chronic problem. The current episode started more than 1 year ago. The problem has been resolved since onset. The problem is controlled. Associated symptoms include shortness of breath. Pertinent negatives include no malaise/fatigue or peripheral edema. Risk factors for coronary artery disease include dyslipidemia, male gender and sedentary lifestyle. The current treatment provides moderate improvement.  Asthma He complains of shortness of breath and wheezing ("every now and then"). There is no cough or frequent throat clearing. This is a chronic problem. The current episode started more than 1 year ago. The problem occurs intermittently. Pertinent negatives include no malaise/fatigue. His symptoms are alleviated by rest. He reports moderate improvement on treatment. His symptoms are not alleviated by rest. His past medical history is significant for asthma.  Dizziness This is a chronic problem. The current episode started more than 1 year ago. The problem occurs intermittently. The problem has been waxing and waning. Pertinent negatives include no coughing. The symptoms are aggravated by bending. He has tried rest for the symptoms. The treatment provided moderate relief.  Hyperlipidemia This is a chronic problem. The current episode started more than 1 year ago. The problem is uncontrolled. He has no history of obesity. Associated symptoms include shortness of breath. Current antihyperlipidemic treatment includes diet change. The current treatment provides no improvement of  lipids. Compliance problems include medication side effects.  Risk factors for coronary artery disease include dyslipidemia and male sex.  Back Pain This is a chronic problem. The current episode started more than 1 year ago. The problem occurs intermittently. The problem has been waxing and waning since onset. The pain is present in the lumbar spine. The quality of the pain is described as aching. The pain is at a severity of 5/10. The pain is mild. He has tried bed rest and home exercises for the symptoms. The treatment provided mild relief.      Review of Systems  Constitutional:  Negative for malaise/fatigue.  Respiratory:  Positive for shortness of breath and wheezing ("every now and then"). Negative for cough.   Musculoskeletal:  Positive for back pain.  Neurological:  Positive for dizziness.  All other systems reviewed and are negative.  Family History  Problem Relation Age of Onset   Diabetes Mother    Hypertension Mother    Diabetes Sister    Asthma Son    Hypertension Brother    Heart disease Brother    Hypertension Brother    Diabetes Brother    Peripheral vascular disease Sister    Hypertension Son    Social History   Socioeconomic History   Marital status: Legally Separated    Spouse name: Not on file   Number of children: 5   Years of education: 10   Highest education level: 10th grade  Occupational History   Occupation: Retired    Comment: Patent examiner  Tobacco Use   Smoking status: Never   Smokeless tobacco: Never  Vaping Use   Vaping Use: Never used  Substance and Sexual Activity  Alcohol use: No   Drug use: No   Sexual activity: Not Currently  Other Topics Concern   Not on file  Social History Narrative   Lives in a house with his his niece. His wife lives in an apartment and he can visit during the day. He is a felon and can't live in the apartments that she's in.        Social Determinants of Health   Financial Resource Strain: Low Risk   (08/08/2021)   Overall Financial Resource Strain (CARDIA)    Difficulty of Paying Living Expenses: Not hard at all  Food Insecurity: No Food Insecurity (08/08/2021)   Hunger Vital Sign    Worried About Running Out of Food in the Last Year: Never true    Ran Out of Food in the Last Year: Never true  Transportation Needs: No Transportation Needs (08/08/2021)   PRAPARE - Hydrologist (Medical): No    Lack of Transportation (Non-Medical): No  Physical Activity: Insufficiently Active (08/08/2021)   Exercise Vital Sign    Days of Exercise per Week: 5 days    Minutes of Exercise per Session: 20 min  Stress: No Stress Concern Present (08/08/2021)   [place]    Feeling of Stress : Not at all  Social Connections: [place] (08/08/2021)   Social Connection and Isolation Panel [NHANES]    Frequency of Communication with Friends and Family: More than three times a week    Frequency of Social Gatherings with Friends and Family: Three times a week    Attends Religious Services: 1 to 4 times per year    Active Member of Clubs or Organizations: Yes    Attends Archivist Meetings: 1 to 4 times per year    Marital Status: Married       Objective:   Physical Exam Vitals reviewed.  Constitutional:      General: He is not in acute distress.    Appearance: He is well-developed.  HENT:     Head: Normocephalic.     Right Ear: Tympanic membrane normal.     Left Ear: Tympanic membrane normal.  Eyes:     General:        Right eye: No discharge.        Left eye: No discharge.     Pupils: Pupils are equal, round, and reactive to light.  Neck:     Thyroid: No thyromegaly.  Cardiovascular:     Rate and Rhythm: Normal rate and regular rhythm.     Heart sounds: Murmur heard.  Pulmonary:     Effort: Pulmonary effort is normal. No respiratory distress.     Breath sounds: Normal breath sounds.  No wheezing.  Abdominal:     General: Bowel sounds are normal. There is no distension.     Palpations: Abdomen is soft.     Tenderness: There is no abdominal tenderness.  Musculoskeletal:        General: No tenderness. Normal range of motion.     Cervical back: Normal range of motion and neck supple.  Skin:    General: Skin is warm and dry.     Findings: No erythema or rash.  Neurological:     Mental Status: He is alert and oriented to person, place, and time.     Cranial Nerves: No cranial nerve deficit.     Deep Tendon Reflexes: Reflexes are normal and symmetric.  Psychiatric:  Behavior: Behavior normal.        Thought Content: Thought content normal.        Judgment: Judgment normal.       BP 133/74   Pulse 94   Temp (!) 97 F (36.1 C) (Temporal)   Ht '5\' 8"'$  (1.727 m)   Wt 196 lb 4.8 oz (89 kg)   SpO2 98%   BMI 29.85 kg/m      Assessment & Plan:  Jared Cruz comes in today with chief complaint of Medical Management of Chronic Issues   Diagnosis and orders addressed:  1. Uncomplicated asthma, unspecified asthma severity, unspecified whether persistent Spacer given and instructions given - CMP14+EGFR - CBC with Differential/Platelet  2. Bilateral low back pain, unspecified chronicity, unspecified whether sciatica present - CMP14+EGFR - CBC with Differential/Platelet  3. Benign prostatic hyperplasia without lower urinary tract symptoms - CMP14+EGFR - CBC with Differential/Platelet  4. Coronary artery disease involving coronary bypass graft of native heart without angina pectoris - CMP14+EGFR - CBC with Differential/Platelet  5. Chronic kidney disease (CKD), stage IV (severe) ([facility]) - CMP14+EGFR - CBC with Differential/Platelet  6. Hyperlipidemia, unspecified hyperlipidemia type - CMP14+EGFR - CBC with Differential/Platelet - Lipid panel  7. Primary hypertension - CMP14+EGFR - CBC with Differential/Platelet  8. Mitral valve disease -  CMP14+EGFR - CBC with Differential/Platelet  9. Overweight (BMI 25.0-29.9) - CMP14+EGFR - CBC with Differential/Platelet  10. Peripheral vascular disease ([place])  - CMP14+EGFR - CBC with Differential/Platelet  11. Vitamin D deficiency - CMP14+EGFR - CBC with Differential/Platelet - VITAMIN D 25 Hydroxy (Vit-D Deficiency, Fractures)  12. Annual physical exam - CMP14+EGFR - CBC with Differential/Platelet - Lipid panel - TSH - VITAMIN D 25 Hydroxy (Vit-D Deficiency, Fractures)   Labs pending Health Maintenance reviewed Diet and exercise encouraged  Follow up plan: 6 months   Evelina Dun, FNP

## 2022-07-20 NOTE — Patient Instructions (Signed)
Health Maintenance After Age 87 After age 87, you are at a higher risk for certain long-term diseases and infections as well as injuries from falls. Falls are a major cause of broken bones and head injuries in people who are older than age 87. Getting regular preventive care can help to keep you healthy and well. Preventive care includes getting regular testing and making lifestyle changes as recommended by your health care provider. Talk with your health care provider about: Which screenings and tests you should have. A screening is a test that checks for a disease when you have no symptoms. A diet and exercise plan that is right for you. What should I know about screenings and tests to prevent falls? Screening and testing are the best ways to find a health problem early. Early diagnosis and treatment give you the best chance of managing medical conditions that are common after age 87. Certain conditions and lifestyle choices may make you more likely to have a fall. Your health care provider may recommend: Regular vision checks. Poor vision and conditions such as cataracts can make you more likely to have a fall. If you wear glasses, make sure to get your prescription updated if your vision changes. Medicine review. Work with your health care provider to regularly review all of the medicines you are taking, including over-the-counter medicines. Ask your health care provider about any side effects that may make you more likely to have a fall. Tell your health care provider if any medicines that you take make you feel dizzy or sleepy. Strength and balance checks. Your health care provider may recommend certain tests to check your strength and balance while standing, walking, or changing positions. Foot health exam. Foot pain and numbness, as well as not wearing proper footwear, can make you more likely to have a fall. Screenings, including: Osteoporosis screening. Osteoporosis is a condition that causes  the bones to get weaker and break more easily. Blood pressure screening. Blood pressure changes and medicines to control blood pressure can make you feel dizzy. Depression screening. You may be more likely to have a fall if you have a fear of falling, feel depressed, or feel unable to do activities that you used to do. Alcohol use screening. Using too much alcohol can affect your balance and may make you more likely to have a fall. Follow these instructions at home: Lifestyle Do not drink alcohol if: Your health care provider tells you not to drink. If you drink alcohol: Limit how much you have to: 0-1 drink a day for women. 0-2 drinks a day for men. Know how much alcohol is in your drink. In the [place] one drink equals one 12 oz bottle of beer (355 mL), one 5 oz glass of wine (148 mL), or one 1 oz glass of hard liquor (44 mL). Do not use any products that contain nicotine or tobacco. These products include cigarettes, chewing tobacco, and vaping devices, such as e-cigarettes. If you need help quitting, ask your health care provider. Activity  Follow a regular exercise program to stay fit. This will help you maintain your balance. Ask your health care provider what types of exercise are appropriate for you. If you need a cane or walker, use it as recommended by your health care provider. Wear supportive shoes that have nonskid soles. Safety  Remove any tripping hazards, such as rugs, cords, and clutter. Install safety equipment such as grab bars in bathrooms and safety rails on stairs. Keep rooms and walkways   well-lit. General instructions Talk with your health care provider about your risks for falling. Tell your health care provider if: You fall. Be sure to tell your health care provider about all falls, even ones that seem minor. You feel dizzy, tiredness (fatigue), or off-balance. Take over-the-counter and prescription medicines only as told by your health care provider. These include  supplements. Eat a healthy diet and maintain a healthy weight. A healthy diet includes low-fat dairy products, low-fat (lean) meats, and fiber from whole grains, beans, and lots of fruits and vegetables. Stay current with your vaccines. Schedule regular health, dental, and eye exams. Summary Having a healthy lifestyle and getting preventive care can help to protect your health and wellness after age 87. Screening and testing are the best way to find a health problem early and help you avoid having a fall. Early diagnosis and treatment give you the best chance for managing medical conditions that are more common for people who are older than age 87. Falls are a major cause of broken bones and head injuries in people who are older than age 87. Take precautions to prevent a fall at home. Work with your health care provider to learn what changes you can make to improve your health and wellness and to prevent falls. This information is not intended to replace advice given to you by your health care provider. Make sure you discuss any questions you have with your health care provider. Document Revised: 10/18/2020 Document Reviewed: 10/18/2020 Elsevier Patient Education  2023 Elsevier Inc.  

## 2022-07-21 ENCOUNTER — Other Ambulatory Visit: Payer: Self-pay | Admitting: Family Medicine

## 2022-07-21 DIAGNOSIS — E875 Hyperkalemia: Secondary | ICD-10-CM

## 2022-07-21 LAB — CMP14+EGFR
ALT: 13 IU/L (ref 0–44)
AST: 18 IU/L (ref 0–40)
Albumin/Globulin Ratio: 1.4 (ref 1.2–2.2)
Albumin: 4.3 g/dL (ref 3.7–4.7)
Alkaline Phosphatase: 49 IU/L (ref 44–121)
BUN/Creatinine Ratio: 16 (ref 10–24)
BUN: 28 mg/dL — ABNORMAL HIGH (ref 8–27)
Bilirubin Total: 0.3 mg/dL (ref 0.0–1.2)
CO2: 23 mmol/L (ref 20–29)
Calcium: 10.3 mg/dL — ABNORMAL HIGH (ref 8.6–10.2)
Chloride: 103 mmol/L (ref 96–106)
Creatinine, Ser: 1.79 mg/dL — ABNORMAL HIGH (ref 0.76–1.27)
Globulin, Total: 3 g/dL (ref 1.5–4.5)
Glucose: 107 mg/dL — ABNORMAL HIGH (ref 70–99)
Potassium: 5.8 mmol/L — ABNORMAL HIGH (ref 3.5–5.2)
Sodium: 139 mmol/L (ref 134–144)
Total Protein: 7.3 g/dL (ref 6.0–8.5)
eGFR: 36 mL/min/{1.73_m2} — ABNORMAL LOW (ref >59)

## 2022-07-21 LAB — CBC WITH DIFFERENTIAL/PLATELET
Basophils Absolute: 0.1 10*3/uL (ref 0.0–0.2)
Basos: 1 %
EOS (ABSOLUTE): 0.3 10*3/uL (ref 0.0–0.4)
Eos: 4 %
Hematocrit: 45 % (ref 37.5–51.0)
Hemoglobin: 14.7 g/dL (ref 13.0–17.7)
Immature Grans (Abs): 0 10*3/uL (ref 0.0–0.1)
Immature Granulocytes: 0 %
Lymphocytes Absolute: 2.2 10*3/uL (ref 0.7–3.1)
Lymphs: 33 %
MCH: 30.2 pg (ref 26.6–33.0)
MCHC: 32.7 g/dL (ref 31.5–35.7)
MCV: 93 fL (ref 79–97)
Monocytes Absolute: 0.7 10*3/uL (ref 0.1–0.9)
Monocytes: 11 %
Neutrophils Absolute: 3.5 10*3/uL (ref 1.4–7.0)
Neutrophils: 51 %
Platelets: 210 10*3/uL (ref 150–450)
RBC: 4.86 x10E6/uL (ref 4.14–5.80)
RDW: 12.9 % (ref 11.6–15.4)
WBC: 6.8 10*3/uL (ref 3.4–10.8)

## 2022-07-21 LAB — LIPID PANEL
Chol/HDL Ratio: 4.4 ratio (ref 0.0–5.0)
Cholesterol, Total: 238 mg/dL — ABNORMAL HIGH (ref 100–199)
HDL: 54 mg/dL (ref >39)
LDL Chol Calc (NIH): 169 mg/dL — ABNORMAL HIGH (ref 0–99)
Triglycerides: 87 mg/dL (ref 0–149)
VLDL Cholesterol Cal: 15 mg/dL (ref 5–40)

## 2022-07-21 LAB — VITAMIN D 25 HYDROXY (VIT D DEFICIENCY, FRACTURES): Vit D, 25-Hydroxy: 33.3 ng/mL (ref 30.0–100.0)

## 2022-07-21 LAB — TSH: TSH: 3.21 u[IU]/mL (ref 0.450–4.500)

## 2022-07-27 ENCOUNTER — Other Ambulatory Visit: Payer: 59

## 2022-07-27 DIAGNOSIS — E875 Hyperkalemia: Secondary | ICD-10-CM

## 2022-07-28 ENCOUNTER — Other Ambulatory Visit: Payer: Self-pay | Admitting: Family Medicine

## 2022-07-28 ENCOUNTER — Other Ambulatory Visit: Payer: Self-pay | Admitting: Family

## 2022-07-28 DIAGNOSIS — E875 Hyperkalemia: Secondary | ICD-10-CM

## 2022-07-28 LAB — BMP8+EGFR
BUN/Creatinine Ratio: 10 (ref 10–24)
BUN: 20 mg/dL (ref 8–27)
CO2: 23 mmol/L (ref 20–29)
Calcium: 9.9 mg/dL (ref 8.6–10.2)
Chloride: 106 mmol/L (ref 96–106)
Creatinine, Ser: 1.93 mg/dL — ABNORMAL HIGH (ref 0.76–1.27)
Glucose: 91 mg/dL (ref 70–99)
Potassium: 5.8 mmol/L — ABNORMAL HIGH (ref 3.5–5.2)
Sodium: 145 mmol/L — ABNORMAL HIGH (ref 134–144)
eGFR: 33 mL/min/{1.73_m2} — ABNORMAL LOW (ref >59)

## 2022-07-28 MED ORDER — SODIUM POLYSTYRENE SULFONATE 15 GM/60ML PO SUSP: 30.0000 g | Freq: Once | ORAL | 0 refills | Status: AC

## 2022-07-28 MED ORDER — AMLODIPINE BESYLATE 10 MG PO TABS: 10.0000 mg | ORAL_TABLET | Freq: Every day | ORAL | 1 refills | Status: DC

## 2022-07-31 ENCOUNTER — Other Ambulatory Visit: Payer: 59

## 2022-07-31 ENCOUNTER — Other Ambulatory Visit: Payer: Self-pay | Admitting: Family Medicine

## 2022-07-31 DIAGNOSIS — E875 Hyperkalemia: Secondary | ICD-10-CM | POA: Diagnosis not present

## 2022-07-31 DIAGNOSIS — J45909 Unspecified asthma, uncomplicated: Secondary | ICD-10-CM

## 2022-07-31 LAB — BMP8+EGFR
BUN/Creatinine Ratio: 12 (ref 10–24)
BUN: 21 mg/dL (ref 8–27)
CO2: 21 mmol/L (ref 20–29)
Calcium: 9.9 mg/dL (ref 8.6–10.2)
Chloride: 104 mmol/L (ref 96–106)
Creatinine, Ser: 1.8 mg/dL — ABNORMAL HIGH (ref 0.76–1.27)
Glucose: 99 mg/dL (ref 70–99)
Potassium: 5.3 mmol/L — ABNORMAL HIGH (ref 3.5–5.2)
Sodium: 140 mmol/L (ref 134–144)
eGFR: 36 mL/min/{1.73_m2} — ABNORMAL LOW (ref >59)

## 2022-07-31 MED ORDER — ALBUTEROL SULFATE HFA 108 (90 BASE) MCG/ACT IN AERS: INHALATION_SPRAY | RESPIRATORY_TRACT | 2 refills | Status: DC

## 2022-08-09 ENCOUNTER — Telehealth: Payer: Self-pay | Admitting: Family

## 2022-08-09 ENCOUNTER — Ambulatory Visit (INDEPENDENT_AMBULATORY_CARE_PROVIDER_SITE_OTHER): Payer: 59

## 2022-08-09 VITALS — Ht 68.0 in | Wt 196.0 lb

## 2022-08-09 DIAGNOSIS — Z0000 Encounter for general adult medical examination without abnormal findings: Secondary | ICD-10-CM

## 2022-08-09 NOTE — Telephone Encounter (Signed)
[place] to schedule their annual wellness visit. Appointment made for 08/09/2022.  Thank you,  Colletta [place],  [place] [facility] Direct Dial ??[phone]
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
## 2022-08-09 NOTE — Patient Instructions (Signed)
Jared Cruz , Thank you for taking time to come for your Medicare Wellness Visit. I appreciate your ongoing commitment to your health goals. Please review the following plan we discussed and let me know if I can assist you in the future.   These are the goals we discussed:  Goals       Patient Stated (pt-stated)      Continue healthy diet and exercise habits.      Patient Stated      Patient will continue to use exercise bike 30-60 mins a day        This is a list of the screening recommended for you and due dates:  Health Maintenance  Topic Date Due   COVID-19 Vaccine (7 - 2023-24 season) 05/16/2022   Medicare Annual Wellness Visit  08/10/2023   DTaP/Tdap/Td vaccine (5 - Td or Tdap) 12/07/2027   Pneumonia Vaccine  Completed   Flu Shot  Completed   Zoster (Shingles) Vaccine  Completed   HPV Vaccine  Aged Out    Advanced directives: Forms are available if you choose in the future to pursue completion.  This is recommended in order to make sure that your health wishes are honored in the event that you are unable to verbalize them to the provider.    Conditions/risks identified: Aim for 30 minutes of exercise or brisk walking, 6-8 glasses of water, and 5 servings of fruits and vegetables each day.   Next appointment: Follow up in one year for your annual wellness visit.   Preventive Care 46 Years and Older, Male  Preventive care refers to lifestyle choices and visits with your health care provider that can promote health and wellness. What does preventive care include? A yearly physical exam. This is also called an annual well check. Dental exams once or twice a year. Routine eye exams. Ask your health care provider how often you should have your eyes checked. Personal lifestyle choices, including: Daily care of your teeth and gums. Regular physical activity. Eating a healthy diet. Avoiding tobacco and drug use. Limiting alcohol use. Practicing safe sex. Taking low doses of  aspirin every day. Taking vitamin and mineral supplements as recommended by your health care provider. What happens during an annual well check? The services and screenings done by your health care provider during your annual well check will depend on your age, overall health, lifestyle risk factors, and family history of disease. Counseling  Your health care provider may ask you questions about your: Alcohol use. Tobacco use. Drug use. Emotional well-being. Home and relationship well-being. Sexual activity. Eating habits. History of falls. Memory and ability to understand (cognition). Work and work Statistician. Screening  You may have the following tests or measurements: Height, weight, and BMI. Blood pressure. Lipid and cholesterol levels. These may be checked every 5 years, or more frequently if you are over 51 years old. Skin check. Lung cancer screening. You may have this screening every year starting at age 64 if you have a 30-pack-year history of smoking and currently smoke or have quit within the past 15 years. Fecal occult blood test (FOBT) of the stool. You may have this test every year starting at age 35. Flexible sigmoidoscopy or colonoscopy. You may have a sigmoidoscopy every 5 years or a colonoscopy every 10 years starting at age 62. Prostate cancer screening. Recommendations will vary depending on your family history and other risks. Hepatitis C blood test. Hepatitis B blood test. Sexually transmitted disease (STD) testing. Diabetes screening.  This is done by checking your blood sugar (glucose) after you have not eaten for a while (fasting). You may have this done every 1-3 years. Abdominal aortic aneurysm (AAA) screening. You may need this if you are a current or former smoker. Osteoporosis. You may be screened starting at age 82 if you are at high risk. Talk with your health care provider about your test results, treatment options, and if necessary, the need for more  tests. Vaccines  Your health care provider may recommend certain vaccines, such as: Influenza vaccine. This is recommended every year. Tetanus, diphtheria, and acellular pertussis (Tdap, Td) vaccine. You may need a Td booster every 10 years. Zoster vaccine. You may need this after age 86. Pneumococcal 13-valent conjugate (PCV13) vaccine. One dose is recommended after age 5. Pneumococcal polysaccharide (PPSV23) vaccine. One dose is recommended after age 53. Talk to your health care provider about which screenings and vaccines you need and how often you need them. This information is not intended to replace advice given to you by your health care provider. Make sure you discuss any questions you have with your health care provider. Document Released: 06/25/2015 Document Revised: 02/16/2016 Document Reviewed: 03/30/2015 Elsevier Interactive Patient Education  2017 [employer] Prevention in the Home Falls can cause injuries. They can happen to people of all ages. There are many things you can do to make your home safe and to help prevent falls. What can I do on the outside of my home? Regularly fix the edges of walkways and driveways and fix any cracks. Remove anything that might make you trip as you walk through a door, such as a raised step or threshold. Trim any bushes or trees on the path to your home. Use bright outdoor lighting. Clear any walking paths of anything that might make someone trip, such as rocks or tools. Regularly check to see if handrails are loose or broken. Make sure that both sides of any steps have handrails. Any raised decks and porches should have guardrails on the edges. Have any leaves, snow, or ice cleared regularly. Use sand or salt on walking paths during winter. Clean up any spills in your garage right away. This includes oil or grease spills. What can I do in the bathroom? Use night lights. Install grab bars by the toilet and in the tub and shower.  Do not use towel bars as grab bars. Use non-skid mats or decals in the tub or shower. If you need to sit down in the shower, use a plastic, non-slip stool. Keep the floor dry. Clean up any water that spills on the floor as soon as it happens. Remove soap buildup in the tub or shower regularly. Attach bath mats securely with double-sided non-slip rug tape. Do not have throw rugs and other things on the floor that can make you trip. What can I do in the bedroom? Use night lights. Make sure that you have a light by your bed that is easy to reach. Do not use any sheets or blankets that are too big for your bed. They should not hang down onto the floor. Have a firm chair that has side arms. You can use this for support while you get dressed. Do not have throw rugs and other things on the floor that can make you trip. What can I do in the kitchen? Clean up any spills right away. Avoid walking on wet floors. Keep items that you use a lot in easy-to-reach places. If  you need to reach something above you, use a strong step stool that has a grab bar. Keep electrical cords out of the way. Do not use floor polish or wax that makes floors slippery. If you must use wax, use non-skid floor wax. Do not have throw rugs and other things on the floor that can make you trip. What can I do with my stairs? Do not leave any items on the stairs. Make sure that there are handrails on both sides of the stairs and use them. Fix handrails that are broken or loose. Make sure that handrails are as long as the stairways. Check any carpeting to make sure that it is firmly attached to the stairs. Fix any carpet that is loose or worn. Avoid having throw rugs at the top or bottom of the stairs. If you do have throw rugs, attach them to the floor with carpet tape. Make sure that you have a light switch at the top of the stairs and the bottom of the stairs. If you do not have them, ask someone to add them for you. What else  can I do to help prevent falls? Wear shoes that: Do not have high heels. Have rubber bottoms. Are comfortable and fit you well. Are closed at the toe. Do not wear sandals. If you use a stepladder: Make sure that it is fully opened. Do not climb a closed stepladder. Make sure that both sides of the stepladder are locked into place. Ask someone to hold it for you, if possible. Clearly mark and make sure that you can see: Any grab bars or handrails. First and last steps. Where the edge of each step is. Use tools that help you move around (mobility aids) if they are needed. These include: Canes. Walkers. Scooters. Crutches. Turn on the lights when you go into a dark area. Replace any light bulbs as soon as they burn out. Set up your furniture so you have a clear path. Avoid moving your furniture around. If any of your floors are uneven, fix them. If there are any pets around you, be aware of where they are. Review your medicines with your doctor. Some medicines can make you feel dizzy. This can increase your chance of falling. Ask your doctor what other things that you can do to help prevent falls. This information is not intended to replace advice given to you by your health care provider. Make sure you discuss any questions you have with your health care provider. Document Released: 03/25/2009 Document Revised: 11/04/2015 Document Reviewed: 07/03/2014 Elsevier Interactive Patient Education  2017 [employer].

## 2022-10-28 ENCOUNTER — Other Ambulatory Visit: Payer: Self-pay | Admitting: Family

## 2022-11-27 ENCOUNTER — Other Ambulatory Visit: Payer: Self-pay | Admitting: Family

## 2022-11-27 DIAGNOSIS — J45909 Unspecified asthma, uncomplicated: Secondary | ICD-10-CM

## 2023-01-09 ENCOUNTER — Other Ambulatory Visit: Payer: Self-pay | Admitting: Family

## 2023-01-09 DIAGNOSIS — E785 Hyperlipidemia, unspecified: Secondary | ICD-10-CM

## 2023-01-09 DIAGNOSIS — I10 Essential (primary) hypertension: Secondary | ICD-10-CM

## 2023-01-12 ENCOUNTER — Other Ambulatory Visit: Payer: Self-pay | Admitting: Family

## 2023-01-19 ENCOUNTER — Ambulatory Visit: Payer: 59 | Admitting: Family

## 2023-01-19 ENCOUNTER — Encounter: Payer: Self-pay | Admitting: Family

## 2023-01-19 VITALS — BP 136/75 | HR 88 | Temp 97.4°F | Ht 68.0 in | Wt 191.0 lb

## 2023-01-19 DIAGNOSIS — H8110 Benign paroxysmal vertigo, unspecified ear: Secondary | ICD-10-CM | POA: Diagnosis not present

## 2023-01-19 DIAGNOSIS — N1832 Chronic kidney disease, stage 3b: Secondary | ICD-10-CM

## 2023-01-19 DIAGNOSIS — E785 Hyperlipidemia, unspecified: Secondary | ICD-10-CM | POA: Diagnosis not present

## 2023-01-19 DIAGNOSIS — N400 Enlarged prostate without lower urinary tract symptoms: Secondary | ICD-10-CM | POA: Diagnosis not present

## 2023-01-19 DIAGNOSIS — E559 Vitamin D deficiency, unspecified: Secondary | ICD-10-CM | POA: Diagnosis not present

## 2023-01-19 DIAGNOSIS — M545 Low back pain, unspecified: Secondary | ICD-10-CM

## 2023-01-19 DIAGNOSIS — J45909 Unspecified asthma, uncomplicated: Secondary | ICD-10-CM | POA: Diagnosis not present

## 2023-01-19 DIAGNOSIS — E663 Overweight: Secondary | ICD-10-CM

## 2023-01-19 DIAGNOSIS — I10 Essential (primary) hypertension: Secondary | ICD-10-CM | POA: Diagnosis not present

## 2023-01-19 DIAGNOSIS — I25810 Atherosclerosis of coronary artery bypass graft(s) without angina pectoris: Secondary | ICD-10-CM | POA: Diagnosis not present

## 2023-01-19 DIAGNOSIS — I739 Peripheral vascular disease, unspecified: Secondary | ICD-10-CM

## 2023-01-19 DIAGNOSIS — Z5320 Procedure and treatment not carried out because of patient's decision for unspecified reasons: Secondary | ICD-10-CM

## 2023-01-19 LAB — CBC WITH DIFFERENTIAL/PLATELET
Basophils Absolute: 0 10*3/uL (ref 0.0–0.2)
Basos: 1 %
EOS (ABSOLUTE): 0.2 10*3/uL (ref 0.0–0.4)
Eos: 4 %
Hematocrit: 42.1 % (ref 37.5–51.0)
Hemoglobin: 13.7 g/dL (ref 13.0–17.7)
Immature Grans (Abs): 0 10*3/uL (ref 0.0–0.1)
Immature Granulocytes: 0 %
Lymphocytes Absolute: 2.3 10*3/uL (ref 0.7–3.1)
Lymphs: 37 %
MCH: 28.7 pg (ref 26.6–33.0)
MCHC: 32.5 g/dL (ref 31.5–35.7)
MCV: 88 fL (ref 79–97)
Monocytes Absolute: 0.5 10*3/uL (ref 0.1–0.9)
Monocytes: 8 %
Neutrophils Absolute: 3.2 10*3/uL (ref 1.4–7.0)
Neutrophils: 50 %
Platelets: 218 10*3/uL (ref 150–450)
RBC: 4.77 x10E6/uL (ref 4.14–5.80)
RDW: 13 % (ref 11.6–15.4)
WBC: 6.4 10*3/uL (ref 3.4–10.8)

## 2023-01-19 LAB — CMP14+EGFR
ALT: 11 IU/L (ref 0–44)
AST: 15 IU/L (ref 0–40)
Albumin: 4.3 g/dL (ref 3.7–4.7)
Alkaline Phosphatase: 51 IU/L (ref 44–121)
BUN/Creatinine Ratio: 11 (ref 10–24)
BUN: 17 mg/dL (ref 8–27)
Bilirubin Total: 0.4 mg/dL (ref 0.0–1.2)
CO2: 21 mmol/L (ref 20–29)
Calcium: 9.7 mg/dL (ref 8.6–10.2)
Chloride: 104 mmol/L (ref 96–106)
Creatinine, Ser: 1.57 mg/dL — ABNORMAL HIGH (ref 0.76–1.27)
Globulin, Total: 2.9 g/dL (ref 1.5–4.5)
Glucose: 100 mg/dL — ABNORMAL HIGH (ref 70–99)
Potassium: 4 mmol/L (ref 3.5–5.2)
Sodium: 140 mmol/L (ref 134–144)
Total Protein: 7.2 g/dL (ref 6.0–8.5)
eGFR: 43 mL/min/{1.73_m2} — ABNORMAL LOW (ref >59)

## 2023-01-19 MED ORDER — MECLIZINE HCL 25 MG PO TABS: ORAL_TABLET | ORAL | 2 refills | Status: DC

## 2023-01-19 NOTE — Progress Notes (Signed)
Subjective:    Patient ID: Jared Cruz, male    DOB: [birth year], 89 y.o.   MRN: [id number]  Pt presents to the office today for chronic follow up. Pt is followed by Cardiologists 6 months for CAD, PVD, and mitral valve prolapse.  He is followed by Nephrologists for CKD. He avoids NSAID's and this is stable.   Asthma He complains of cough and wheezing. There is no shortness of breath. This is a chronic problem. The current episode started more than 1 year ago. The problem has been waxing and waning. Pertinent negatives include no headaches or malaise/fatigue. His symptoms are alleviated by rest. He reports moderate improvement on treatment. His past medical history is significant for asthma.  Hypertension This is a chronic problem. The current episode started more than 1 year ago. The problem has been resolved since onset. Pertinent negatives include no headaches, malaise/fatigue, peripheral edema or shortness of breath. Risk factors for coronary artery disease include dyslipidemia, obesity and sedentary lifestyle. The current treatment provides moderate improvement.  Hyperlipidemia This is a chronic problem. The current episode started more than 1 year ago. The problem is uncontrolled. Pertinent negatives include no shortness of breath. Current antihyperlipidemic treatment includes diet change. The current treatment provides mild improvement of lipids. Risk factors for coronary artery disease include dyslipidemia, hypertension and a sedentary lifestyle.  Dizziness This is a recurrent problem. The current episode started more than 1 month ago. The problem occurs intermittently. Associated symptoms include coughing. Pertinent negatives include no headaches. The symptoms are aggravated by bending. He has tried rest for the symptoms. The treatment provided mild relief.  Benign Prostatic Hypertrophy This is a chronic problem. The current episode started more than 1 year ago. Irritative symptoms  include nocturia. The treatment provided mild relief.      Review of Systems  Constitutional:  Negative for malaise/fatigue.  Respiratory:  Positive for cough and wheezing. Negative for shortness of breath.   Genitourinary:  Positive for nocturia.  Neurological:  Positive for dizziness. Negative for headaches.  All other systems reviewed and are negative.      Objective:   Physical Exam Vitals reviewed.  Constitutional:      General: He is not in acute distress.    Appearance: He is well-developed.  HENT:     Head: Normocephalic.     Right Ear: Tympanic membrane normal.     Left Ear: Tympanic membrane normal.  Eyes:     General:        Right eye: No discharge.        Left eye: No discharge.     Pupils: Pupils are equal, round, and reactive to light.  Neck:     Thyroid: No thyromegaly.  Cardiovascular:     Rate and Rhythm: Normal rate and regular rhythm.     Heart sounds: Murmur heard.  Pulmonary:     Effort: Pulmonary effort is normal. No respiratory distress.     Breath sounds: Normal breath sounds. No wheezing.  Abdominal:     General: Bowel sounds are normal. There is no distension.     Palpations: Abdomen is soft.     Tenderness: There is no abdominal tenderness.  Musculoskeletal:        General: No tenderness. Normal range of motion.     Cervical back: Normal range of motion and neck supple.  Skin:    General: Skin is warm and dry.     Findings: No erythema or rash.  Neurological:  Mental Status: He is alert and oriented to person, place, and time.     Cranial Nerves: No cranial nerve deficit.     Deep Tendon Reflexes: Reflexes are normal and symmetric.  Psychiatric:        Behavior: Behavior normal.        Thought Content: Thought content normal.        Judgment: Judgment normal.       BP 136/75   Pulse 88   Temp (!) 97.4 F (36.3 C) (Temporal)   Ht 5\' 8"  (1.727 m)   Wt 191 lb (86.6 kg)   SpO2 100%   BMI 29.04 kg/m      Assessment &  Plan:  Jared Cruz comes in today with chief complaint of Medical Management of Chronic Issues (Patient complains of vertigo this morning has gotten really bad since June. Needs refill of meclizine )   Diagnosis and orders addressed:  1. Uncomplicated asthma, unspecified asthma severity, unspecified whether persistent - CBC with Differential/Platelet - CMP14+EGFR  2. Bilateral low back pain, unspecified chronicity, unspecified whether sciatica present - CBC with Differential/Platelet - CMP14+EGFR  3. Benign prostatic hyperplasia without lower urinary tract symptoms - CBC with Differential/Platelet - CMP14+EGFR  4. Benign paroxysmal positional vertigo, unspecified laterality - meclizine (ANTIVERT) 25 MG tablet; TAKE 1 TABLET EVERY 8 HOURS AS NEEDED FOR DIZZINESS  Dispense: 45 tablet; Refill: 2 - CBC with Differential/Platelet - CMP14+EGFR - Ambulatory referral to Physical Therapy  5. Coronary artery disease involving coronary bypass graft of native heart without angina pectoris  - CBC with Differential/Platelet - CMP14+EGFR  6. Primary hypertension - CBC with Differential/Platelet - CMP14+EGFR  7. Hyperlipidemia, unspecified hyperlipidemia type - CBC with Differential/Platelet - CMP14+EGFR  8. Overweight (BMI 25.0-29.9) - CBC with Differential/Platelet - CMP14+EGFR  9. Peripheral vascular disease (HCC) - CBC with Differential/Platelet - CMP14+EGFR  10. Vitamin D deficiency - CBC with Differential/Platelet - CMP14+EGFR  11. Refusal of statin medication by patient - CBC with Differential/Platelet - CMP14+EGFR  12. Stage 3b chronic kidney disease (HCC) - CBC with Differential/Platelet - CMP14+EGFR   Labs pending Referral to PT for BPPV, antivert as needed  Fall precautions  Health Maintenance reviewed Diet and exercise encouraged  Follow up plan: 6 months    Jannifer Rodney, FNP

## 2023-01-19 NOTE — Patient Instructions (Signed)
Benign Positional Vertigo Vertigo is the feeling that you or your surroundings are moving when they are not. Benign positional vertigo is the most common form of vertigo. This is usually a harmless condition (benign). This condition is positional. This means that symptoms are triggered by certain movements and positions. This condition can be dangerous if it occurs while you are doing something that could cause harm to yourself or others. This includes activities such as driving or operating machinery. What are the causes? The inner ear has fluid-filled canals that help your brain sense movement and balance. When the fluid moves, the brain receives messages about your body's position. With benign positional vertigo, calcium crystals in the inner ear break free and disturb the inner ear area. This causes your brain to receive confusing messages about your body's position. What increases the risk? You are more likely to develop this condition if: You are a woman. You are 50 years of age or older. You have recently had a head injury. You have an inner ear disease. What are the signs or symptoms? Symptoms of this condition usually happen when you move your head or your eyes in different directions. Symptoms may start suddenly and usually last for less than a minute. They include: Loss of balance and falling. Feeling like you are spinning or moving. Feeling like your surroundings are spinning or moving. Nausea and vomiting. Blurred vision. Dizziness. Involuntary eye movement (nystagmus). Symptoms can be mild and cause only minor problems, or they can be severe and interfere with daily life. Episodes of benign positional vertigo may return (recur) over time. Symptoms may also improve over time. How is this diagnosed? This condition may be diagnosed based on: Your medical history. A physical exam of the head, neck, and ears. Positional tests to check for or stimulate vertigo. You may be asked to  turn your head and change positions, such as going from sitting to lying down. A health care provider will watch for symptoms of vertigo. You may be referred to a health care provider who specializes in ear, nose, and throat problems (ENT or otolaryngologist) or a provider who specializes in disorders of the nervous system (neurologist). How is this treated?  This condition may be treated in a session in which your health care provider moves your head in specific positions to help the displaced crystals in your inner ear move. Treatment for this condition may take several sessions. Surgery may be needed in severe cases, but this is rare. In some cases, benign positional vertigo may resolve on its own in 2-4 weeks. Follow these instructions at home: Safety Move slowly. Avoid sudden body or head movements or certain positions, as told by your health care provider. Avoid driving or operating machinery until your health care provider says it is safe. Avoid doing any tasks that would be dangerous to you or others if vertigo occurs. If you have trouble walking or keeping your balance, try using a cane for stability. If you feel dizzy or unstable, sit down right away. Return to your normal activities as told by your health care provider. Ask your health care provider what activities are safe for you. General instructions Take over-the-counter and prescription medicines only as told by your health care provider. Drink enough fluid to keep your urine pale yellow. Keep all follow-up visits. This is important. Contact a health care provider if: You have a fever. Your condition gets worse or you develop new symptoms. Your family or friends notice any behavioral changes.   You have nausea or vomiting that gets worse. You have numbness or a prickling and tingling sensation. Get help right away if you: Have difficulty speaking or moving. Are always dizzy or faint. Develop severe headaches. Have weakness in  your legs or arms. Have changes in your hearing or vision. Develop a stiff neck. Develop sensitivity to light. These symptoms may represent a serious problem that is an emergency. Do not wait to see if the symptoms will go away. Get medical help right away. Call your local emergency services (911 in the [place] Do not drive yourself to the hospital. Summary Vertigo is the feeling that you or your surroundings are moving when they are not. Benign positional vertigo is the most common form of vertigo. This condition is caused by calcium crystals in the inner ear that become displaced. This causes a disturbance in an area of the inner ear that helps your brain sense movement and balance. Symptoms include loss of balance and falling, feeling that you or your surroundings are moving, nausea and vomiting, and blurred vision. This condition can be diagnosed based on symptoms, a physical exam, and positional tests. Follow safety instructions as told by your health care provider and keep all follow-up visits. This is important. This information is not intended to replace advice given to you by your health care provider. Make sure you discuss any questions you have with your health care provider. Document Revised: 04/28/2020 Document Reviewed: 04/28/2020 Elsevier Patient Education  2024 [employer].

## 2023-02-05 ENCOUNTER — Other Ambulatory Visit: Payer: Self-pay

## 2023-02-05 ENCOUNTER — Ambulatory Visit (HOSPITAL_COMMUNITY): Payer: 59 | Admitting: Physical Therapy

## 2023-02-05 DIAGNOSIS — R42 Dizziness and giddiness: Secondary | ICD-10-CM

## 2023-02-05 DIAGNOSIS — H8110 Benign paroxysmal vertigo, unspecified ear: Secondary | ICD-10-CM | POA: Insufficient documentation
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
## 2023-02-13 ENCOUNTER — Ambulatory Visit (HOSPITAL_COMMUNITY): Payer: 59 | Attending: Family

## 2023-02-13 DIAGNOSIS — R42 Dizziness and giddiness: Secondary | ICD-10-CM | POA: Diagnosis not present
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
## 2023-02-14 ENCOUNTER — Encounter (HOSPITAL_COMMUNITY): Payer: Self-pay

## 2023-02-14 NOTE — Therapy (Signed)
[facility] Outpatient Rehabilitation at [facility] [address] [place], [place], [address] Phone: [phone]   Fax:  [phone]  Patient Details  Name: Jared Cruz MRN: [id number] Date of Birth: October 07, 1933 Referring Provider:  No ref. provider found  Encounter Date: 02/14/2023 PHYSICAL THERAPY DISCHARGE SUMMARY  Visits from Start of Care: 2  Current functional level related to goals / functional outcomes: unknown   Remaining deficits: unknown   Education / Equipment: HEP   Patient agrees to discharge. Patient goals were not met. Patient is being discharged due to the patient's request.   Marisue Brooklyn, PT 02/14/2023, 8:18 AM  [facility] Outpatient Rehabilitation at [facility] [address] [place], [place], [address] Phone: [phone]   Fax:  [phone]

## 2023-02-21 ENCOUNTER — Encounter (HOSPITAL_COMMUNITY): Payer: 59 | Admitting: Physical Therapy

## 2023-02-27 ENCOUNTER — Encounter (HOSPITAL_COMMUNITY): Payer: 59 | Admitting: Physical Therapy

## 2023-03-08 ENCOUNTER — Ambulatory Visit (HOSPITAL_COMMUNITY): Payer: 59

## 2023-07-18 ENCOUNTER — Telehealth: Payer: Self-pay

## 2023-07-18 NOTE — Telephone Encounter (Signed)
 Copied from CRM [id number]. Topic: General - Other >> Jul 18, 2023 12:54 PM Elle L wrote: Reason for CRM: NP Derrick with [employer] was wanting to report that he did a quantaflo DT on the patient and got a result of 0.47 in the left foot 0.78 in the right foot. He is having no complications at this time. Their call back number is [phone] if needed.

## 2023-07-19 NOTE — Telephone Encounter (Signed)
 Ok

## 2023-07-23 ENCOUNTER — Encounter: Payer: Self-pay | Admitting: Family

## 2023-07-23 ENCOUNTER — Ambulatory Visit (INDEPENDENT_AMBULATORY_CARE_PROVIDER_SITE_OTHER): Payer: 59 | Admitting: Family

## 2023-07-23 VITALS — BP 120/64 | HR 99 | Temp 98.1°F | Wt 186.2 lb

## 2023-07-23 DIAGNOSIS — Z23 Encounter for immunization: Secondary | ICD-10-CM | POA: Diagnosis not present

## 2023-07-23 DIAGNOSIS — E785 Hyperlipidemia, unspecified: Secondary | ICD-10-CM

## 2023-07-23 DIAGNOSIS — Z5320 Procedure and treatment not carried out because of patient's decision for unspecified reasons: Secondary | ICD-10-CM

## 2023-07-23 DIAGNOSIS — N1832 Chronic kidney disease, stage 3b: Secondary | ICD-10-CM | POA: Diagnosis not present

## 2023-07-23 DIAGNOSIS — J45909 Unspecified asthma, uncomplicated: Secondary | ICD-10-CM | POA: Diagnosis not present

## 2023-07-23 DIAGNOSIS — I739 Peripheral vascular disease, unspecified: Secondary | ICD-10-CM | POA: Diagnosis not present

## 2023-07-23 DIAGNOSIS — I10 Essential (primary) hypertension: Secondary | ICD-10-CM | POA: Diagnosis not present

## 2023-07-23 DIAGNOSIS — H8110 Benign paroxysmal vertigo, unspecified ear: Secondary | ICD-10-CM

## 2023-07-23 DIAGNOSIS — Z0001 Encounter for general adult medical examination with abnormal findings: Secondary | ICD-10-CM

## 2023-07-23 DIAGNOSIS — E559 Vitamin D deficiency, unspecified: Secondary | ICD-10-CM

## 2023-07-23 DIAGNOSIS — N400 Enlarged prostate without lower urinary tract symptoms: Secondary | ICD-10-CM

## 2023-07-23 DIAGNOSIS — M545 Low back pain, unspecified: Secondary | ICD-10-CM | POA: Diagnosis not present

## 2023-07-23 DIAGNOSIS — Z0000 Encounter for general adult medical examination without abnormal findings: Secondary | ICD-10-CM

## 2023-07-23 DIAGNOSIS — I25810 Atherosclerosis of coronary artery bypass graft(s) without angina pectoris: Secondary | ICD-10-CM | POA: Diagnosis not present

## 2023-07-23 DIAGNOSIS — E663 Overweight: Secondary | ICD-10-CM

## 2023-07-23 NOTE — Progress Notes (Signed)
 Subjective:    Patient ID: Jared Cruz, male    DOB: 1933-08-29, 89 y.o.   MRN: [id number]  Chief Complaint  Patient presents with   Follow-up    6 month follow up    Pt presents to the office today for CPE and chronic follow up.   Pt is followed by Cardiologists 6 months for CAD, PVD, and mitral valve prolapse.  He is followed by Nephrologists for CKD. He avoids NSAID's and this is stable.   Asthma There is no cough, shortness of breath or wheezing. This is a chronic problem. The current episode started more than 1 year ago. The problem has been waxing and waning. Pertinent negatives include no headaches or malaise/fatigue. His symptoms are alleviated by rest. He reports moderate improvement on treatment. His past medical history is significant for asthma.  Hypertension This is a chronic problem. The current episode started more than 1 year ago. The problem has been waxing and waning since onset. The problem is uncontrolled. Pertinent negatives include no headaches, malaise/fatigue, peripheral edema or shortness of breath. Risk factors for coronary artery disease include dyslipidemia, obesity and sedentary lifestyle. The current treatment provides moderate improvement.  Hyperlipidemia This is a chronic problem. The current episode started more than 1 year ago. The problem is uncontrolled. Exacerbating diseases include obesity. Pertinent negatives include no shortness of breath. Current antihyperlipidemic treatment includes diet change. The current treatment provides mild improvement of lipids. Risk factors for coronary artery disease include dyslipidemia, hypertension and a sedentary lifestyle.  Dizziness This is a chronic problem. The current episode started more than 1 year ago. The problem occurs intermittently. Pertinent negatives include no coughing or headaches. The symptoms are aggravated by bending. He has tried rest for the symptoms. The treatment provided mild relief.  Benign  Prostatic Hypertrophy This is a chronic problem. The current episode started more than 1 year ago. Irritative symptoms include nocturia. The treatment provided mild relief.      Review of Systems  Constitutional:  Negative for malaise/fatigue.  Respiratory:  Negative for cough, shortness of breath and wheezing.   Genitourinary:  Positive for nocturia.  Neurological:  Positive for dizziness. Negative for headaches.  All other systems reviewed and are negative.  Family History  Problem Relation Age of Onset   Diabetes Mother    Hypertension Mother    Diabetes Sister    Asthma Son    Hypertension Brother    Heart disease Brother    Hypertension Brother    Diabetes Brother    Peripheral vascular disease Sister    Hypertension Son    Social History   Socioeconomic History   Marital status: Legally Separated    Spouse name: Not on file   Number of children: 5   Years of education: 10   Highest education level: 10th grade  Occupational History   Occupation: Retired    Comment: Media planner  Tobacco Use   Smoking status: Never   Smokeless tobacco: Never  Vaping Use   Vaping status: Never Used  Substance and Sexual Activity   Alcohol use: No   Drug use: No   Sexual activity: Not Currently  Other Topics Concern   Not on file  Social History Narrative   Lives in a house with his his niece. His wife lives in an apartment and he can visit during the day. He is a felon and can't live in the apartments that she's in.        Social  Drivers of Health   Financial Resource Strain: Low Risk  (08/09/2022)   Overall Financial Resource Strain (CARDIA)    Difficulty of Paying Living Expenses: Not hard at all  Food Insecurity: No Food Insecurity (08/09/2022)   Hunger Vital Sign    Worried About Running Out of Food in the Last Year: Never true    Ran Out of Food in the Last Year: Never true  Transportation Needs: No Transportation Needs (08/09/2022)   PRAPARE - Therapist, art (Medical): No    Lack of Transportation (Non-Medical): No  Physical Activity: Sufficiently Active (08/09/2022)   Exercise Vital Sign    Days of Exercise per Week: 5 days    Minutes of Exercise per Session: 60 min  Stress: No Stress Concern Present (08/09/2022)   [name]    Feeling of Stress : Not at all  Social Connections: Moderately Integrated (08/09/2022)   Social Connection and Isolation Panel [NHANES]    Frequency of Communication with Friends and Family: More than three times a week    Frequency of Social Gatherings with Friends and Family: Three times a week    Attends Religious Services: 1 to 4 times per year    Active Member of Clubs or Organizations: Yes    Attends Banker Meetings: More than 4 times per year    Marital Status: Separated       Objective:   Physical Exam Vitals reviewed.  Constitutional:      General: He is not in acute distress.    Appearance: He is well-developed.  HENT:     Head: Normocephalic.     Right Ear: Tympanic membrane normal.     Left Ear: Tympanic membrane normal.  Eyes:     General:        Right eye: No discharge.        Left eye: No discharge.     Pupils: Pupils are equal, round, and reactive to light.  Neck:     Thyroid : No thyromegaly.  Cardiovascular:     Rate and Rhythm: Normal rate and regular rhythm.     Heart sounds: Murmur heard.  Pulmonary:     Effort: Pulmonary effort is normal. No respiratory distress.     Breath sounds: Normal breath sounds. No wheezing.  Abdominal:     General: Bowel sounds are normal. There is no distension.     Palpations: Abdomen is soft.     Tenderness: There is no abdominal tenderness.  Musculoskeletal:        General: No tenderness. Normal range of motion.     Cervical back: Normal range of motion and neck supple.  Skin:    General: Skin is warm and dry.     Findings: No erythema or rash.   Neurological:     Mental Status: He is alert and oriented to person, place, and time.     Cranial Nerves: No cranial nerve deficit.     Deep Tendon Reflexes: Reflexes are normal and symmetric.  Psychiatric:        Behavior: Behavior normal.        Thought Content: Thought content normal.        Judgment: Judgment normal.       BP (!) 158/77   Pulse 99   Temp 98.1 F (36.7 C)   Wt 186 lb 3.2 oz (84.5 kg)   SpO2 96%   BMI 28.31 kg/m  Assessment & Plan:  Adi E Yetman comes in today with chief complaint of Follow-up (6 month follow up)   Diagnosis and orders addressed:  1. Encounter for immunization - Flu Vaccine Trivalent High Dose (Fluad) - CMP14+EGFR - CBC with Differential/Platelet  2. Annual physical exam (Primary) - CMP14+EGFR - CBC with Differential/Platelet - Lipid panel  3. Uncomplicated asthma, unspecified asthma severity, unspecified whether persistent - CMP14+EGFR - CBC with Differential/Platelet  4. Bilateral low back pain, unspecified chronicity, unspecified whether sciatica present - CMP14+EGFR - CBC with Differential/Platelet  5. Benign prostatic hyperplasia without lower urinary tract symptoms - CMP14+EGFR - CBC with Differential/Platelet  6. Benign paroxysmal positional vertigo, unspecified laterality - CMP14+EGFR - CBC with Differential/Platelet  7. Coronary artery disease involving coronary bypass graft of native heart without angina pectoris - CMP14+EGFR - CBC with Differential/Platelet  8. Stage 3b chronic kidney disease (HCC) - CMP14+EGFR - CBC with Differential/Platelet  9. Hyperlipidemia, unspecified hyperlipidemia type - CMP14+EGFR - CBC with Differential/Platelet  10. Primary hypertension - CMP14+EGFR - CBC with Differential/Platelet  11. Overweight (BMI 25.0-29.9) - CMP14+EGFR - CBC with Differential/Platelet  12. Peripheral vascular disease (HCC) - CMP14+EGFR - CBC with Differential/Platelet  13. Refusal  of statin medication by patient - CMP14+EGFR - CBC with Differential/Platelet  14. Vitamin D  deficiency - CMP14+EGFR - CBC with Differential/Platelet  Labs pending Continue current medications  Fall precautions  Health Maintenance reviewed Diet and exercise encouraged  Follow up plan: 6 months    Tommas Fragmin, FNP

## 2023-07-23 NOTE — Patient Instructions (Signed)
 Health Maintenance After Age 88 After age 4, you are at a higher risk for certain long-term diseases and infections as well as injuries from falls. Falls are a major cause of broken bones and head injuries in people who are older than age 47. Getting regular preventive care can help to keep you healthy and well. Preventive care includes getting regular testing and making lifestyle changes as recommended by your health care provider. Talk with your health care provider about: Which screenings and tests you should have. A screening is a test that checks for a disease when you have no symptoms. A diet and exercise plan that is right for you. What should I know about screenings and tests to prevent falls? Screening and testing are the best ways to find a health problem early. Early diagnosis and treatment give you the best chance of managing medical conditions that are common after age 37. Certain conditions and lifestyle choices may make you more likely to have a fall. Your health care provider may recommend: Regular vision checks. Poor vision and conditions such as cataracts can make you more likely to have a fall. If you wear glasses, make sure to get your prescription updated if your vision changes. Medicine review. Work with your health care provider to regularly review all of the medicines you are taking, including over-the-counter medicines. Ask your health care provider about any side effects that may make you more likely to have a fall. Tell your health care provider if any medicines that you take make you feel dizzy or sleepy. Strength and balance checks. Your health care provider may recommend certain tests to check your strength and balance while standing, walking, or changing positions. Foot health exam. Foot pain and numbness, as well as not wearing proper footwear, can make you more likely to have a fall. Screenings, including: Osteoporosis screening. Osteoporosis is a condition that causes  the bones to get weaker and break more easily. Blood pressure screening. Blood pressure changes and medicines to control blood pressure can make you feel dizzy. Depression screening. You may be more likely to have a fall if you have a fear of falling, feel depressed, or feel unable to do activities that you used to do. Alcohol use screening. Using too much alcohol can affect your balance and may make you more likely to have a fall. Follow these instructions at home: Lifestyle Do not drink alcohol if: Your health care provider tells you not to drink. If you drink alcohol: Limit how much you have to: 0-1 drink a day for women. 0-2 drinks a day for men. Know how much alcohol is in your drink. In the [place] one drink equals one 12 oz bottle of beer (355 mL), one 5 oz glass of wine (148 mL), or one 1 oz glass of hard liquor (44 mL). Do not use any products that contain nicotine or tobacco. These products include cigarettes, chewing tobacco, and vaping devices, such as e-cigarettes. If you need help quitting, ask your health care provider. Activity  Follow a regular exercise program to stay fit. This will help you maintain your balance. Ask your health care provider what types of exercise are appropriate for you. If you need a cane or walker, use it as recommended by your health care provider. Wear supportive shoes that have nonskid soles. Safety  Remove any tripping hazards, such as rugs, cords, and clutter. Install safety equipment such as grab bars in bathrooms and safety rails on stairs. Keep rooms and walkways  well-lit. General instructions Talk with your health care provider about your risks for falling. Tell your health care provider if: You fall. Be sure to tell your health care provider about all falls, even ones that seem minor. You feel dizzy, tiredness (fatigue), or off-balance. Take over-the-counter and prescription medicines only as told by your health care provider. These include  supplements. Eat a healthy diet and maintain a healthy weight. A healthy diet includes low-fat dairy products, low-fat (lean) meats, and fiber from whole grains, beans, and lots of fruits and vegetables. Stay current with your vaccines. Schedule regular health, dental, and eye exams. Summary Having a healthy lifestyle and getting preventive care can help to protect your health and wellness after age 11. Screening and testing are the best way to find a health problem early and help you avoid having a fall. Early diagnosis and treatment give you the best chance for managing medical conditions that are more common for people who are older than age 28. Falls are a major cause of broken bones and head injuries in people who are older than age 48. Take precautions to prevent a fall at home. Work with your health care provider to learn what changes you can make to improve your health and wellness and to prevent falls. This information is not intended to replace advice given to you by your health care provider. Make sure you discuss any questions you have with your health care provider. Document Revised: 10/18/2020 Document Reviewed: 10/18/2020 Elsevier Patient Education  2024 [employer].

## 2023-07-24 ENCOUNTER — Other Ambulatory Visit: Payer: Self-pay | Admitting: Family

## 2023-07-24 LAB — CBC WITH DIFFERENTIAL/PLATELET
Basophils Absolute: 0.1 10*3/uL (ref 0.0–0.2)
Basos: 1 %
EOS (ABSOLUTE): 0.3 10*3/uL (ref 0.0–0.4)
Eos: 4 %
Hematocrit: 44.3 % (ref 37.5–51.0)
Hemoglobin: 14.5 g/dL (ref 13.0–17.7)
Immature Grans (Abs): 0 10*3/uL (ref 0.0–0.1)
Immature Granulocytes: 0 %
Lymphocytes Absolute: 2.3 10*3/uL (ref 0.7–3.1)
Lymphs: 33 %
MCH: 29.8 pg (ref 26.6–33.0)
MCHC: 32.7 g/dL (ref 31.5–35.7)
MCV: 91 fL (ref 79–97)
Monocytes Absolute: 0.6 10*3/uL (ref 0.1–0.9)
Monocytes: 9 %
Neutrophils Absolute: 3.7 10*3/uL (ref 1.4–7.0)
Neutrophils: 53 %
Platelets: 213 10*3/uL (ref 150–450)
RBC: 4.86 x10E6/uL (ref 4.14–5.80)
RDW: 13.8 % (ref 11.6–15.4)
WBC: 6.9 10*3/uL (ref 3.4–10.8)

## 2023-07-24 LAB — CMP14+EGFR
ALT: 12 IU/L (ref 0–44)
AST: 17 IU/L (ref 0–40)
Albumin: 4.5 g/dL (ref 3.7–4.7)
Alkaline Phosphatase: 54 IU/L (ref 44–121)
BUN/Creatinine Ratio: 14 (ref 10–24)
BUN: 23 mg/dL (ref 8–27)
Bilirubin Total: 0.5 mg/dL (ref 0.0–1.2)
CO2: 19 mmol/L — ABNORMAL LOW (ref 20–29)
Calcium: 10 mg/dL (ref 8.6–10.2)
Chloride: 106 mmol/L (ref 96–106)
Creatinine, Ser: 1.65 mg/dL — ABNORMAL HIGH (ref 0.76–1.27)
Globulin, Total: 3 g/dL (ref 1.5–4.5)
Glucose: 95 mg/dL (ref 70–99)
Potassium: 5.2 mmol/L (ref 3.5–5.2)
Sodium: 144 mmol/L (ref 134–144)
Total Protein: 7.5 g/dL (ref 6.0–8.5)
eGFR: 40 mL/min/{1.73_m2} — ABNORMAL LOW (ref >59)

## 2023-07-24 LAB — LIPID PANEL
Chol/HDL Ratio: 5.2 ratio — ABNORMAL HIGH (ref 0.0–5.0)
Cholesterol, Total: 292 mg/dL — ABNORMAL HIGH (ref 100–199)
HDL: 56 mg/dL (ref >39)
LDL Chol Calc (NIH): 217 mg/dL — ABNORMAL HIGH (ref 0–99)
Triglycerides: 110 mg/dL (ref 0–149)
VLDL Cholesterol Cal: 19 mg/dL (ref 5–40)

## 2023-09-19 ENCOUNTER — Other Ambulatory Visit: Payer: Self-pay | Admitting: Family

## 2023-09-19 DIAGNOSIS — J45909 Unspecified asthma, uncomplicated: Secondary | ICD-10-CM

## 2023-10-09 ENCOUNTER — Ambulatory Visit (INDEPENDENT_AMBULATORY_CARE_PROVIDER_SITE_OTHER)

## 2023-10-09 VITALS — BP 120/64 | Temp 99.0°F | Ht 68.0 in | Wt 186.0 lb

## 2023-10-09 DIAGNOSIS — Z0000 Encounter for general adult medical examination without abnormal findings: Secondary | ICD-10-CM
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
## 2023-10-09 NOTE — Patient Instructions (Signed)
 [name] , Thank you for taking time to come for your Medicare Wellness Visit. I appreciate your ongoing commitment to your health goals. Please review the following plan we discussed and let me know if I can assist you in the future.   Referrals/Orders/Follow-Ups/Clinician Recommendations: n/a  This is a list of the screening recommended for you and due dates:  Health Maintenance  Topic Date Due   COVID-19 Vaccine (7 - 2024-25 season) 10/24/2024*   Flu Shot  01/11/2024   Medicare Annual Wellness Visit  10/08/2024   DTaP/Tdap/Td vaccine (5 - Td or Tdap) 12/07/2027   Pneumonia Vaccine  Completed   Zoster (Shingles) Vaccine  Completed   HPV Vaccine  Aged Out   Meningitis B Vaccine  Aged Out  *Topic was postponed. The date shown is not the original due date.    Advanced directives: (Declined) Advance directive discussed with you today. Even though you declined this today, please call our office should you change your mind, and we can give you the proper paperwork for you to fill out.  Next Medicare Annual Wellness Visit scheduled for next year: Yes

## 2023-10-15 ENCOUNTER — Other Ambulatory Visit: Payer: Self-pay | Admitting: Family

## 2023-10-15 DIAGNOSIS — J45909 Unspecified asthma, uncomplicated: Secondary | ICD-10-CM

## 2023-10-15 DIAGNOSIS — E785 Hyperlipidemia, unspecified: Secondary | ICD-10-CM

## 2023-10-16 ENCOUNTER — Other Ambulatory Visit: Payer: Self-pay | Admitting: Family Medicine

## 2023-10-16 MED ORDER — LOSARTAN POTASSIUM 50 MG PO TABS: 50.0000 mg | ORAL_TABLET | Freq: Every day | ORAL | 1 refills | Status: DC

## 2023-10-16 MED ORDER — AMLODIPINE BESYLATE 10 MG PO TABS: 10.0000 mg | ORAL_TABLET | Freq: Every day | ORAL | 0 refills | Status: DC

## 2023-10-22 ENCOUNTER — Encounter (HOSPITAL_COMMUNITY): Payer: Self-pay

## 2023-10-22 ENCOUNTER — Other Ambulatory Visit: Payer: Self-pay

## 2023-10-22 ENCOUNTER — Emergency Department (HOSPITAL_COMMUNITY)
Admission: EM | Admit: 2023-10-22 | Discharge: 2023-10-22 | Disposition: A | Discharge status: Home / Self Care | Attending: Emergency Medicine | Admitting: Emergency Medicine

## 2023-10-22 DIAGNOSIS — T22242A Burn of second degree of left axilla, initial encounter: Secondary | ICD-10-CM | POA: Insufficient documentation

## 2023-10-22 DIAGNOSIS — X102XXA Contact with fats and cooking oils, initial encounter: Secondary | ICD-10-CM | POA: Diagnosis not present

## 2023-10-22 DIAGNOSIS — T2122XA Burn of second degree of abdominal wall, initial encounter: Secondary | ICD-10-CM | POA: Diagnosis not present

## 2023-10-22 DIAGNOSIS — T22212A Burn of second degree of left forearm, initial encounter: Secondary | ICD-10-CM | POA: Insufficient documentation

## 2023-10-22 DIAGNOSIS — T22211A Burn of second degree of right forearm, initial encounter: Secondary | ICD-10-CM | POA: Insufficient documentation

## 2023-10-22 DIAGNOSIS — Z7982 Long term (current) use of aspirin: Secondary | ICD-10-CM | POA: Diagnosis not present

## 2023-10-22 DIAGNOSIS — T310 Burns involving less than 10% of body surface: Secondary | ICD-10-CM | POA: Diagnosis not present

## 2023-10-22 DIAGNOSIS — T300 Burn of unspecified body region, unspecified degree: Secondary | ICD-10-CM

## 2023-10-22 MED ORDER — BACITRACIN ZINC 500 UNIT/GM EX OINT: 1.0000 | TOPICAL_OINTMENT | Freq: Two times a day (BID) | CUTANEOUS | 0 refills | Status: DC

## 2023-10-22 MED ORDER — OXYCODONE-ACETAMINOPHEN 5-325 MG PO TABS: 1.0000 | ORAL_TABLET | Freq: Four times a day (QID) | ORAL | 0 refills | Status: DC | PRN

## 2023-10-22 MED ORDER — BACITRACIN ZINC 500 UNIT/GM EX OINT
TOPICAL_OINTMENT | Freq: Once | CUTANEOUS | Status: AC
  Administered 2023-10-22: 1 via TOPICAL
  Filled 2023-10-22: qty 0.9
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
## 2023-10-26 ENCOUNTER — Encounter: Payer: Self-pay | Admitting: Family

## 2023-10-26 ENCOUNTER — Ambulatory Visit (INDEPENDENT_AMBULATORY_CARE_PROVIDER_SITE_OTHER): Admitting: Family

## 2023-10-26 VITALS — BP 133/67 | HR 96 | Temp 97.0°F | Ht 68.0 in | Wt 190.6 lb

## 2023-10-26 DIAGNOSIS — H8110 Benign paroxysmal vertigo, unspecified ear: Secondary | ICD-10-CM | POA: Diagnosis not present

## 2023-10-26 DIAGNOSIS — Z09 Encounter for follow-up examination after completed treatment for conditions other than malignant neoplasm: Secondary | ICD-10-CM | POA: Diagnosis not present

## 2023-10-26 DIAGNOSIS — T3120 Burns involving 20-29% of body surface with 0% to 9% third degree burns: Secondary | ICD-10-CM | POA: Diagnosis not present

## 2023-10-26 DIAGNOSIS — R42 Dizziness and giddiness: Secondary | ICD-10-CM

## 2023-10-26 NOTE — Patient Instructions (Signed)
 Burn Care, Adult A burn is an injury to the skin or the tissues under the skin. It may be caused by a fire, hot liquid or steam, chemicals, electricity, or the sun. There are three types of burns: First degree. These burns are similar to a sunburn. They may cause your skin to be red, slightly swollen, and tender. They may be treated at home. Second degree. These burns are very painful. They may cause your skin to turn very red, swell, leak fluid, look shiny, and blister. In many cases, these burns may be treated at home. If they cover your hands, feet, face, or genitals, get help from a health care provider. Third degree. These burns are the most severe. They may not be painful, but you may feel pain around the edges of them. Your skin may turn white or black and may look charred, dry, and leathery. These burns cause lasting damage. If you get a third-degree burn, get help right away. Treatment will depend on the type of burn you have. Taking care of your burn can help to prevent pain and infection. It can also help the burn heal more quickly. How to care for a first-degree burn Right after the burn: Rinse or soak the burn under cool water for 5 minutes or more. Put a cool, wet cloth (cool compress) on your skin. This may help with pain. Do not put ice on your burn. This can cause more damage. Caring for the burn Clean and care for the burn as told by your provider. You may be told to: Use mild soap and water to clean the area. Use a clean cloth to pat the burned area dry after cleaning it. Do not rub or scrub the burn. Put lotion or aloe vera gel on your skin. How to care for a second-degree burn Right after the burn: Rinse or soak the burn under cool water. Do this for 5-10 minutes. Do not put ice on your burn. This can cause more damage. Take off any jewelry or clothing near the burn. Lightly cover the burn with a clean cloth. Caring for the burn Clean and care for the burn as told by your  provider. You may be told to: Clean or rinse out the burned area. Put a cream or ointment on the burn. You may need to use an antibiotic cream that has silver in it. This can kill bacteria. Place a germ-free (sterile) dressing over the burn. A dressing is a bandage that is put over a burn to help it heal. Raise (elevate) the injured area above the level of your heart while you are sitting or lying down. How to care for a third-degree burn Right after the burn: Lightly cover the burn with a clean, dry cloth. Get help right away. You may need to: Stay in the hospital. Have surgery to remove burned tissue or get a skin graft. Get fluids through an IV. Caring for the burn Clean and care for the burn as told by your provider. You may be told to: Clean or rinse out the burn. Put a cream or ointment on the burn. Put a sterile dressing in the burned area (packing). Put a sterile dressing over the burn. Use pressure (compression) dressings. Elevate the injured area above the level of your heart while you are sitting or lying down. Wear splints or immobilizers as told by your provider. Do exercises as told by your provider. Rest as told by your provider. Do not do sports or  other physical activities until your provider says that you can. How to prevent infection when caring for a burn  Take these steps to prevent infection and more damage to the tissue. Make sure you: Wash your hands with soap and water for at least 20 seconds before and after you care for your burn. If soap and water are not available, use hand sanitizer. Wear clean gloves as told by your provider. Do not put butter, oil, toothpaste, or other home remedies on the burn. Do not scratch or pick at the burn. Do not break any blisters. Do not peel the skin. Do not rub your burn, even when cleaning it. Check your burn every day for signs of infection. Check for: More redness, swelling, or pain. Warmth. Pus or a bad smell. Red  streaks around the burn. Follow these instructions at home Medicines Take over-the-counter and prescription medicines only as told by your provider. If you were prescribed antibiotics, take or apply them as told by your provider. Do not stop using the antibiotic even if you start to feel better. General instructions Do not use any products that contain nicotine or tobacco. These products include cigarettes, chewing tobacco, and vaping devices, such as e-cigarettes. If you need help quitting, ask your provider. Drink enough fluid to keep your pee (urine) pale yellow. Protect your burn from the sun. Contact a health care provider if: Your burn does not get better, or it gets worse. You have any signs of infection. Your burn starts to look different or gets black or red spots. Your pain does not get better with medicine. You have anxiety or depression after the injury. Get help right away if: You have red streaks near the burn. You are in severe pain. This information is not intended to replace advice given to you by your health care provider. Make sure you discuss any questions you have with your health care provider. Document Revised: 06/15/2022 Document Reviewed: 06/14/2022 Elsevier Patient Education  2024 [employer].
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
## 2023-12-13 ENCOUNTER — Other Ambulatory Visit: Payer: Self-pay | Admitting: Family

## 2023-12-13 ENCOUNTER — Encounter (INDEPENDENT_AMBULATORY_CARE_PROVIDER_SITE_OTHER): Payer: Self-pay | Admitting: Otolaryngology

## 2023-12-13 DIAGNOSIS — H8110 Benign paroxysmal vertigo, unspecified ear: Secondary | ICD-10-CM

## 2023-12-20 ENCOUNTER — Other Ambulatory Visit: Payer: Self-pay | Admitting: Family

## 2024-01-22 ENCOUNTER — Ambulatory Visit (INDEPENDENT_AMBULATORY_CARE_PROVIDER_SITE_OTHER): Payer: 59 | Admitting: Family

## 2024-01-22 ENCOUNTER — Encounter: Payer: Self-pay | Admitting: Family

## 2024-01-22 VITALS — BP 144/76 | HR 89 | Temp 97.3°F | Ht 68.0 in | Wt 184.8 lb

## 2024-01-22 DIAGNOSIS — N1832 Chronic kidney disease, stage 3b: Secondary | ICD-10-CM

## 2024-01-22 DIAGNOSIS — I25810 Atherosclerosis of coronary artery bypass graft(s) without angina pectoris: Secondary | ICD-10-CM

## 2024-01-22 DIAGNOSIS — E785 Hyperlipidemia, unspecified: Secondary | ICD-10-CM

## 2024-01-22 DIAGNOSIS — Z5320 Procedure and treatment not carried out because of patient's decision for unspecified reasons: Secondary | ICD-10-CM

## 2024-01-22 DIAGNOSIS — I10 Essential (primary) hypertension: Secondary | ICD-10-CM

## 2024-01-22 DIAGNOSIS — N400 Enlarged prostate without lower urinary tract symptoms: Secondary | ICD-10-CM

## 2024-01-22 DIAGNOSIS — J45909 Unspecified asthma, uncomplicated: Secondary | ICD-10-CM

## 2024-01-22 DIAGNOSIS — E559 Vitamin D deficiency, unspecified: Secondary | ICD-10-CM

## 2024-01-22 DIAGNOSIS — T2122XS Burn of second degree of abdominal wall, sequela: Secondary | ICD-10-CM | POA: Diagnosis not present

## 2024-01-22 DIAGNOSIS — E663 Overweight: Secondary | ICD-10-CM

## 2024-01-22 NOTE — Patient Instructions (Signed)
 Burn Care, Adult A burn is an injury to the skin or the tissues under the skin. It may be caused by a fire, hot liquid or steam, chemicals, electricity, or the sun. There are three types of burns: First degree. These burns are similar to a sunburn. They may cause your skin to be red, slightly swollen, and tender. They may be treated at home. Second degree. These burns are very painful. They may cause your skin to turn very red, swell, leak fluid, look shiny, and blister. In many cases, these burns may be treated at home. If they cover your hands, feet, face, or genitals, get help from a health care provider. Third degree. These burns are the most severe. They may not be painful, but you may feel pain around the edges of them. Your skin may turn white or black and may look charred, dry, and leathery. These burns cause lasting damage. If you get a third-degree burn, get help right away. Treatment will depend on the type of burn you have. Taking care of your burn can help to prevent pain and infection. It can also help the burn heal more quickly. How to care for a first-degree burn Right after the burn: Rinse or soak the burn under cool water for 5 minutes or more. Put a cool, wet cloth (cool compress) on your skin. This may help with pain. Do not put ice on your burn. This can cause more damage. Caring for the burn Clean and care for the burn as told by your provider. You may be told to: Use mild soap and water to clean the area. Use a clean cloth to pat the burned area dry after cleaning it. Do not rub or scrub the burn. Put lotion or aloe vera gel on your skin. How to care for a second-degree burn Right after the burn: Rinse or soak the burn under cool water. Do this for 5-10 minutes. Do not put ice on your burn. This can cause more damage. Take off any jewelry or clothing near the burn. Lightly cover the burn with a clean cloth. Caring for the burn Clean and care for the burn as told by your  provider. You may be told to: Clean or rinse out the burned area. Put a cream or ointment on the burn. You may need to use an antibiotic cream that has silver in it. This can kill bacteria. Place a germ-free (sterile) dressing over the burn. A dressing is a bandage that is put over a burn to help it heal. Raise (elevate) the injured area above the level of your heart while you are sitting or lying down. How to care for a third-degree burn Right after the burn: Lightly cover the burn with a clean, dry cloth. Get help right away. You may need to: Stay in the hospital. Have surgery to remove burned tissue or get a skin graft. Get fluids through an IV. Caring for the burn Clean and care for the burn as told by your provider. You may be told to: Clean or rinse out the burn. Put a cream or ointment on the burn. Put a sterile dressing in the burned area (packing). Put a sterile dressing over the burn. Use pressure (compression) dressings. Elevate the injured area above the level of your heart while you are sitting or lying down. Wear splints or immobilizers as told by your provider. Do exercises as told by your provider. Rest as told by your provider. Do not do sports or  other physical activities until your provider says that you can. How to prevent infection when caring for a burn  Take these steps to prevent infection and more damage to the tissue. Make sure you: Wash your hands with soap and water for at least 20 seconds before and after you care for your burn. If soap and water are not available, use hand sanitizer. Wear clean gloves as told by your provider. Do not put butter, oil, toothpaste, or other home remedies on the burn. Do not scratch or pick at the burn. Do not break any blisters. Do not peel the skin. Do not rub your burn, even when cleaning it. Check your burn every day for signs of infection. Check for: More redness, swelling, or pain. Warmth. Pus or a bad smell. Red  streaks around the burn. Follow these instructions at home Medicines Take over-the-counter and prescription medicines only as told by your provider. If you were prescribed antibiotics, take or apply them as told by your provider. Do not stop using the antibiotic even if you start to feel better. General instructions Do not use any products that contain nicotine or tobacco. These products include cigarettes, chewing tobacco, and vaping devices, such as e-cigarettes. If you need help quitting, ask your provider. Drink enough fluid to keep your pee (urine) pale yellow. Protect your burn from the sun. Contact a health care provider if: Your burn does not get better, or it gets worse. You have any signs of infection. Your burn starts to look different or gets black or red spots. Your pain does not get better with medicine. You have anxiety or depression after the injury. Get help right away if: You have red streaks near the burn. You are in severe pain. This information is not intended to replace advice given to you by your health care provider. Make sure you discuss any questions you have with your health care provider. Document Revised: 06/15/2022 Document Reviewed: 06/14/2022 Elsevier Patient Education  2024 [employer].
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
## 2024-01-23 LAB — CMP14+EGFR
ALT: 5 IU/L (ref 0–44)
AST: 14 IU/L (ref 0–40)
Albumin: 4.5 g/dL (ref 3.7–4.7)
Alkaline Phosphatase: 51 IU/L (ref 44–121)
BUN/Creatinine Ratio: 15 (ref 10–24)
BUN: 28 mg/dL — ABNORMAL HIGH (ref 8–27)
Bilirubin Total: 0.5 mg/dL (ref 0.0–1.2)
CO2: 17 mmol/L — ABNORMAL LOW (ref 20–29)
Calcium: 10.2 mg/dL (ref 8.6–10.2)
Chloride: 106 mmol/L (ref 96–106)
Creatinine, Ser: 1.89 mg/dL — ABNORMAL HIGH (ref 0.76–1.27)
Globulin, Total: 3 g/dL (ref 1.5–4.5)
Glucose: 95 mg/dL (ref 70–99)
Potassium: 5.6 mmol/L — ABNORMAL HIGH (ref 3.5–5.2)
Sodium: 142 mmol/L (ref 134–144)
Total Protein: 7.5 g/dL (ref 6.0–8.5)
eGFR: 34 mL/min/1.73 — ABNORMAL LOW (ref >59)

## 2024-01-24 ENCOUNTER — Ambulatory Visit: Payer: Self-pay | Admitting: Family

## 2024-01-28 ENCOUNTER — Ambulatory Visit (INDEPENDENT_AMBULATORY_CARE_PROVIDER_SITE_OTHER): Admitting: Family

## 2024-01-28 ENCOUNTER — Encounter: Payer: Self-pay | Admitting: Family

## 2024-01-28 VITALS — BP 128/76 | HR 111 | Temp 97.8°F | Ht 68.0 in | Wt 185.4 lb

## 2024-01-28 DIAGNOSIS — T2122XD Burn of second degree of abdominal wall, subsequent encounter: Secondary | ICD-10-CM | POA: Diagnosis not present

## 2024-01-28 DIAGNOSIS — T2122XS Burn of second degree of abdominal wall, sequela: Secondary | ICD-10-CM | POA: Diagnosis not present

## 2024-01-28 NOTE — Patient Instructions (Signed)
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
## 2024-02-05 ENCOUNTER — Encounter: Payer: Self-pay | Admitting: Family

## 2024-02-05 ENCOUNTER — Ambulatory Visit (INDEPENDENT_AMBULATORY_CARE_PROVIDER_SITE_OTHER): Admitting: Family

## 2024-02-05 VITALS — BP 146/76 | HR 97 | Temp 97.3°F | Ht 68.0 in | Wt 184.8 lb

## 2024-02-05 DIAGNOSIS — T2122XS Burn of second degree of abdominal wall, sequela: Secondary | ICD-10-CM

## 2024-02-05 DIAGNOSIS — T2122XD Burn of second degree of abdominal wall, subsequent encounter: Secondary | ICD-10-CM

## 2024-02-05 NOTE — Patient Instructions (Signed)
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
## 2024-02-05 NOTE — Addendum Note (Signed)
 Addended by: LAVELL LYE A on: 02/05/2024 04:28 PM   Modules accepted: Level of Service

## 2024-02-05 NOTE — Addendum Note (Signed)
 Addended by: LAVELL LYE A on: 02/05/2024 04:32 PM   Modules accepted: Level of Service

## 2024-02-15 ENCOUNTER — Encounter: Payer: Self-pay | Admitting: Family

## 2024-02-15 ENCOUNTER — Ambulatory Visit (INDEPENDENT_AMBULATORY_CARE_PROVIDER_SITE_OTHER): Admitting: Family

## 2024-02-15 VITALS — BP 152/72 | HR 102 | Temp 97.3°F | Ht 68.0 in | Wt 184.2 lb

## 2024-02-15 DIAGNOSIS — T2122XD Burn of second degree of abdominal wall, subsequent encounter: Secondary | ICD-10-CM

## 2024-02-15 DIAGNOSIS — T2122XS Burn of second degree of abdominal wall, sequela: Secondary | ICD-10-CM | POA: Diagnosis not present

## 2024-02-15 NOTE — Patient Instructions (Signed)
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
## 2024-03-04 ENCOUNTER — Encounter: Payer: Self-pay | Admitting: Family

## 2024-03-04 ENCOUNTER — Ambulatory Visit: Admitting: Family

## 2024-03-04 VITALS — BP 145/80 | HR 90 | Temp 97.3°F | Ht 68.0 in | Wt 183.2 lb

## 2024-03-04 DIAGNOSIS — T2122XD Burn of second degree of abdominal wall, subsequent encounter: Secondary | ICD-10-CM

## 2024-03-04 DIAGNOSIS — T2122XS Burn of second degree of abdominal wall, sequela: Secondary | ICD-10-CM
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
## 2024-03-13 ENCOUNTER — Telehealth: Payer: Self-pay

## 2024-03-13 MED ORDER — LOSARTAN POTASSIUM 50 MG PO TABS: 50.0000 mg | ORAL_TABLET | Freq: Every day | ORAL | 1 refills | Status: DC

## 2024-03-13 NOTE — Addendum Note (Signed)
 Addended by: MICHELINE KNEE F on: 03/13/2024 12:18 PM   Modules accepted: Orders

## 2024-03-13 NOTE — Telephone Encounter (Signed)
90 DAY SUPPLY SENT 

## 2024-03-13 NOTE — Telephone Encounter (Signed)
 Copied from CRM [id number]. Topic: Clinical - Medication Question >> Mar 13, 2024 11:25 AM Antwanette L wrote: Reason for CRM: Antoinette, a Pharmacologist from [employer], called to request a refill for losartan  (COZAAR ) 50 MG tablet. She is requesting a 90-day supply with additional refills. Please sent refill to [facility] and [facility]. Tedi can be contacted at [phone].  Pharmacy Details [facility] and [facility] [address] [place] [address] Phone: [phone] Fax: [phone]

## 2024-03-31 ENCOUNTER — Other Ambulatory Visit: Payer: Self-pay | Admitting: Family

## 2024-03-31 DIAGNOSIS — E785 Hyperlipidemia, unspecified: Secondary | ICD-10-CM

## 2024-04-03 ENCOUNTER — Ambulatory Visit (INDEPENDENT_AMBULATORY_CARE_PROVIDER_SITE_OTHER): Admitting: Family

## 2024-04-03 ENCOUNTER — Encounter: Payer: Self-pay | Admitting: Family

## 2024-04-03 VITALS — BP 140/65 | HR 100 | Temp 96.8°F | Ht 68.0 in | Wt 181.8 lb

## 2024-04-03 DIAGNOSIS — T2122XD Burn of second degree of abdominal wall, subsequent encounter: Secondary | ICD-10-CM

## 2024-04-03 DIAGNOSIS — T2122XS Burn of second degree of abdominal wall, sequela: Secondary | ICD-10-CM | POA: Diagnosis not present

## 2024-04-03 NOTE — Patient Instructions (Signed)
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
## 2024-04-07 ENCOUNTER — Telehealth: Payer: Self-pay | Admitting: Family

## 2024-04-07 MED ORDER — SILVER SULFADIAZINE 1 % EX CREA: 1.0000 | TOPICAL_CREAM | Freq: Every day | CUTANEOUS | 3 refills | Status: AC

## 2024-04-07 NOTE — Telephone Encounter (Signed)
  Prescription Request  04/07/2024  Is this a Controlled Substance medicine? no  Have you seen your PCP in the last 2 weeks? YES 10-23  If YES, route message to pool  -  If NO, patient needs to be scheduled for appointment.  What is the name of the medication or equipment? Silvadene Cream  Have you contacted your pharmacy to request a refill? yes   Which pharmacy would you like this sent to? [facility]   Patient notified that their request is being sent to the clinical staff for review and that they should receive a response within 2 business days.

## 2024-04-07 NOTE — Telephone Encounter (Signed)
 Prescription sent to pharmacy.

## 2024-04-23 ENCOUNTER — Other Ambulatory Visit (HOSPITAL_COMMUNITY): Payer: Self-pay

## 2024-05-01 ENCOUNTER — Ambulatory Visit (INDEPENDENT_AMBULATORY_CARE_PROVIDER_SITE_OTHER): Admitting: Family

## 2024-05-01 ENCOUNTER — Encounter: Payer: Self-pay | Admitting: Family

## 2024-05-01 VITALS — BP 149/70 | HR 80 | Temp 97.1°F | Ht 68.0 in | Wt 182.4 lb

## 2024-05-01 DIAGNOSIS — N1832 Chronic kidney disease, stage 3b: Secondary | ICD-10-CM

## 2024-05-01 DIAGNOSIS — T2122XS Burn of second degree of abdominal wall, sequela: Secondary | ICD-10-CM

## 2024-05-01 DIAGNOSIS — E663 Overweight: Secondary | ICD-10-CM | POA: Diagnosis not present

## 2024-05-01 DIAGNOSIS — I10 Essential (primary) hypertension: Secondary | ICD-10-CM | POA: Diagnosis not present

## 2024-05-01 NOTE — Patient Instructions (Signed)
 Health Maintenance After Age 88 After age 27, you are at a higher risk for certain long-term diseases and infections as well as injuries from falls. Falls are a major cause of broken bones and head injuries in people who are older than age 73. Getting regular preventive care can help to keep you healthy and well. Preventive care includes getting regular testing and making lifestyle changes as recommended by your health care provider. Talk with your health care provider about: Which screenings and tests you should have. A screening is a test that checks for a disease when you have no symptoms. A diet and exercise plan that is right for you. What should I know about screenings and tests to prevent falls? Screening and testing are the best ways to find a health problem early. Early diagnosis and treatment give you the best chance of managing medical conditions that are common after age 90. Certain conditions and lifestyle choices may make you more likely to have a fall. Your health care provider may recommend: Regular vision checks. Poor vision and conditions such as cataracts can make you more likely to have a fall. If you wear glasses, make sure to get your prescription updated if your vision changes. Medicine review. Work with your health care provider to regularly review all of the medicines you are taking, including over-the-counter medicines. Ask your health care provider about any side effects that may make you more likely to have a fall. Tell your health care provider if any medicines that you take make you feel dizzy or sleepy. Strength and balance checks. Your health care provider may recommend certain tests to check your strength and balance while standing, walking, or changing positions. Foot health exam. Foot pain and numbness, as well as not wearing proper footwear, can make you more likely to have a fall. Screenings, including: Osteoporosis screening. Osteoporosis is a condition that causes  the bones to get weaker and break more easily. Blood pressure screening. Blood pressure changes and medicines to control blood pressure can make you feel dizzy. Depression screening. You may be more likely to have a fall if you have a fear of falling, feel depressed, or feel unable to do activities that you used to do. Alcohol  use screening. Using too much alcohol  can affect your balance and may make you more likely to have a fall. Follow these instructions at home: Lifestyle Do not drink alcohol  if: Your health care provider tells you not to drink. If you drink alcohol : Limit how much you have to: 0-1 drink a day for women. 0-2 drinks a day for men. Know how much alcohol  is in your drink. In the [place] one drink equals one 12 oz bottle of beer (355 mL), one 5 oz glass of wine (148 mL), or one 1 oz glass of hard liquor (44 mL). Do not use any products that contain nicotine or tobacco. These products include cigarettes, chewing tobacco, and vaping devices, such as e-cigarettes. If you need help quitting, ask your health care provider. Activity  Follow a regular exercise program to stay fit. This will help you maintain your balance. Ask your health care provider what types of exercise are appropriate for you. If you need a cane or walker, use it as recommended by your health care provider. Wear supportive shoes that have nonskid soles. Safety  Remove any tripping hazards, such as rugs, cords, and clutter. Install safety equipment such as grab bars in bathrooms and safety rails on stairs. Keep rooms and walkways  well-lit. General instructions Talk with your health care provider about your risks for falling. Tell your health care provider if: You fall. Be sure to tell your health care provider about all falls, even ones that seem minor. You feel dizzy, tiredness (fatigue), or off-balance. Take over-the-counter and prescription medicines only as told by your health care provider. These include  supplements. Eat a healthy diet and maintain a healthy weight. A healthy diet includes low-fat dairy products, low-fat (lean) meats, and fiber from whole grains, beans, and lots of fruits and vegetables. Stay current with your vaccines. Schedule regular health, dental, and eye exams. Summary Having a healthy lifestyle and getting preventive care can help to protect your health and wellness after age 15. Screening and testing are the best way to find a health problem early and help you avoid having a fall. Early diagnosis and treatment give you the best chance for managing medical conditions that are more common for people who are older than age 42. Falls are a major cause of broken bones and head injuries in people who are older than age 64. Take precautions to prevent a fall at home. Work with your health care provider to learn what changes you can make to improve your health and wellness and to prevent falls. This information is not intended to replace advice given to you by your health care provider. Make sure you discuss any questions you have with your health care provider. Document Revised: 10/18/2020 Document Reviewed: 10/18/2020 Elsevier Patient Education  2024 [employer].
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
## 2024-05-20 ENCOUNTER — Other Ambulatory Visit: Payer: Self-pay | Admitting: Family

## 2024-07-11 ENCOUNTER — Encounter (HOSPITAL_COMMUNITY): Payer: Self-pay

## 2024-07-11 ENCOUNTER — Emergency Department (HOSPITAL_COMMUNITY)

## 2024-07-11 ENCOUNTER — Other Ambulatory Visit: Payer: Self-pay

## 2024-07-11 ENCOUNTER — Emergency Department (HOSPITAL_COMMUNITY)
Admission: EM | Admit: 2024-07-11 | Discharge: 2024-07-12 | Disposition: A | Attending: Emergency Medicine | Admitting: Emergency Medicine

## 2024-07-11 DIAGNOSIS — J45909 Unspecified asthma, uncomplicated: Secondary | ICD-10-CM | POA: Insufficient documentation

## 2024-07-11 DIAGNOSIS — N189 Chronic kidney disease, unspecified: Secondary | ICD-10-CM | POA: Insufficient documentation

## 2024-07-11 DIAGNOSIS — Z79899 Other long term (current) drug therapy: Secondary | ICD-10-CM | POA: Insufficient documentation

## 2024-07-11 DIAGNOSIS — J189 Pneumonia, unspecified organism: Secondary | ICD-10-CM | POA: Insufficient documentation

## 2024-07-11 DIAGNOSIS — Z951 Presence of aortocoronary bypass graft: Secondary | ICD-10-CM | POA: Insufficient documentation

## 2024-07-11 DIAGNOSIS — R55 Syncope and collapse: Secondary | ICD-10-CM | POA: Insufficient documentation

## 2024-07-11 DIAGNOSIS — I129 Hypertensive chronic kidney disease with stage 1 through stage 4 chronic kidney disease, or unspecified chronic kidney disease: Secondary | ICD-10-CM | POA: Insufficient documentation

## 2024-07-11 DIAGNOSIS — Z7982 Long term (current) use of aspirin: Secondary | ICD-10-CM | POA: Insufficient documentation

## 2024-07-11 DIAGNOSIS — I2510 Atherosclerotic heart disease of native coronary artery without angina pectoris: Secondary | ICD-10-CM | POA: Insufficient documentation

## 2024-07-11 LAB — CBC
HCT: 44.6 % (ref 39.0–52.0)
Hemoglobin: 14.3 g/dL (ref 13.0–17.0)
MCH: 30 pg (ref 26.0–34.0)
MCHC: 32.1 g/dL (ref 30.0–36.0)
MCV: 93.5 fL (ref 80.0–100.0)
Platelets: 205 10*3/uL (ref 150–400)
RBC: 4.77 MIL/uL (ref 4.22–5.81)
RDW: 13.3 % (ref 11.5–15.5)
WBC: 8.8 10*3/uL (ref 4.0–10.5)
nRBC: 0 % (ref 0.0–0.2)

## 2024-07-11 LAB — COMPREHENSIVE METABOLIC PANEL WITH GFR
ALT: <5 U/L (ref 0–44)
AST: 18 U/L (ref 15–41)
Albumin: 4.4 g/dL (ref 3.5–5.0)
Alkaline Phosphatase: 53 U/L (ref 38–126)
Anion gap: 14 (ref 5–15)
BUN: 23 mg/dL (ref 8–23)
CO2: 22 mmol/L (ref 22–32)
Calcium: 9.8 mg/dL (ref 8.9–10.3)
Chloride: 105 mmol/L (ref 98–111)
Creatinine, Ser: 1.67 mg/dL — ABNORMAL HIGH (ref 0.61–1.24)
GFR, Estimated: 39 mL/min — ABNORMAL LOW
Glucose, Bld: 110 mg/dL — ABNORMAL HIGH (ref 70–99)
Potassium: 4.4 mmol/L (ref 3.5–5.1)
Sodium: 141 mmol/L (ref 135–145)
Total Bilirubin: 0.3 mg/dL (ref 0.0–1.2)
Total Protein: 7.9 g/dL (ref 6.5–8.1)

## 2024-07-11 LAB — TROPONIN T, HIGH SENSITIVITY
Troponin T High Sensitivity: 24 ng/L — ABNORMAL HIGH (ref 0–19)
Troponin T High Sensitivity: 23 ng/L — ABNORMAL HIGH (ref 0–19)

## 2024-07-11 LAB — URINALYSIS, ROUTINE W REFLEX MICROSCOPIC
Bilirubin Urine: NEGATIVE
Glucose, UA: 50 mg/dL — AB
Hgb urine dipstick: NEGATIVE
Ketones, ur: NEGATIVE mg/dL
Leukocytes,Ua: NEGATIVE
Nitrite: NEGATIVE
Protein, ur: NEGATIVE mg/dL
Specific Gravity, Urine: 1.013 (ref 1.005–1.030)
pH: 6 (ref 5.0–8.0)

## 2024-07-11 LAB — CK: Total CK: 115 U/L (ref 49–397)

## 2024-07-11 LAB — MAGNESIUM: Magnesium: 2.4 mg/dL (ref 1.7–2.4)

## 2024-07-11 LAB — CBG MONITORING, ED: Glucose-Capillary: 132 mg/dL — ABNORMAL HIGH (ref 70–99)

## 2024-07-11 NOTE — ED Notes (Signed)
 Pt is Aox4 during head to toe assessment.

## 2024-07-11 NOTE — ED Notes (Signed)
CBG 98  

## 2024-07-11 NOTE — ED Notes (Signed)
 When pt was found after passing out pt had a urinated on himself. Pt is aware he needs to give a urine sample. Urinal at bedside.
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
## 2024-07-11 NOTE — ED Triage Notes (Signed)
 Pt arrived via home and was visiting with his wife in a treatment room when the Pts wife began yelling for help. Per Pts wife, Pt stood up reporting he was feeling warm and then slumped over and passed over. Pt diaphoretic upon initial assessment, did lose control of bowel but is now A+O x 4.

## 2024-07-12 ENCOUNTER — Observation Stay (HOSPITAL_COMMUNITY)
Admission: EM | Admit: 2024-07-12 | Discharge: 2024-07-14 | Disposition: A | Attending: Family Medicine | Admitting: Family Medicine

## 2024-07-12 ENCOUNTER — Encounter (HOSPITAL_COMMUNITY): Payer: Self-pay | Admitting: Family Medicine

## 2024-07-12 ENCOUNTER — Observation Stay (HOSPITAL_COMMUNITY)

## 2024-07-12 ENCOUNTER — Other Ambulatory Visit (HOSPITAL_COMMUNITY): Payer: Self-pay | Admitting: *Deleted

## 2024-07-12 ENCOUNTER — Other Ambulatory Visit: Payer: Self-pay

## 2024-07-12 DIAGNOSIS — I129 Hypertensive chronic kidney disease with stage 1 through stage 4 chronic kidney disease, or unspecified chronic kidney disease: Secondary | ICD-10-CM | POA: Insufficient documentation

## 2024-07-12 DIAGNOSIS — I2510 Atherosclerotic heart disease of native coronary artery without angina pectoris: Secondary | ICD-10-CM | POA: Insufficient documentation

## 2024-07-12 DIAGNOSIS — E785 Hyperlipidemia, unspecified: Secondary | ICD-10-CM | POA: Diagnosis present

## 2024-07-12 DIAGNOSIS — Z79899 Other long term (current) drug therapy: Secondary | ICD-10-CM | POA: Insufficient documentation

## 2024-07-12 DIAGNOSIS — Z5320 Procedure and treatment not carried out because of patient's decision for unspecified reasons: Secondary | ICD-10-CM | POA: Insufficient documentation

## 2024-07-12 DIAGNOSIS — N1832 Chronic kidney disease, stage 3b: Secondary | ICD-10-CM | POA: Diagnosis not present

## 2024-07-12 DIAGNOSIS — R55 Syncope and collapse: Principal | ICD-10-CM | POA: Diagnosis present

## 2024-07-12 DIAGNOSIS — I25810 Atherosclerosis of coronary artery bypass graft(s) without angina pectoris: Secondary | ICD-10-CM | POA: Diagnosis present

## 2024-07-12 DIAGNOSIS — E870 Hyperosmolality and hypernatremia: Secondary | ICD-10-CM | POA: Diagnosis present

## 2024-07-12 DIAGNOSIS — I4510 Unspecified right bundle-branch block: Secondary | ICD-10-CM | POA: Diagnosis present

## 2024-07-12 DIAGNOSIS — N400 Enlarged prostate without lower urinary tract symptoms: Secondary | ICD-10-CM | POA: Diagnosis present

## 2024-07-12 DIAGNOSIS — H8110 Benign paroxysmal vertigo, unspecified ear: Secondary | ICD-10-CM | POA: Diagnosis not present

## 2024-07-12 DIAGNOSIS — D72829 Elevated white blood cell count, unspecified: Secondary | ICD-10-CM | POA: Diagnosis present

## 2024-07-12 DIAGNOSIS — I951 Orthostatic hypotension: Secondary | ICD-10-CM | POA: Diagnosis not present

## 2024-07-12 DIAGNOSIS — I10 Essential (primary) hypertension: Secondary | ICD-10-CM | POA: Diagnosis present

## 2024-07-12 DIAGNOSIS — I059 Rheumatic mitral valve disease, unspecified: Secondary | ICD-10-CM

## 2024-07-12 DIAGNOSIS — I340 Nonrheumatic mitral (valve) insufficiency: Secondary | ICD-10-CM | POA: Insufficient documentation

## 2024-07-12 DIAGNOSIS — J45909 Unspecified asthma, uncomplicated: Secondary | ICD-10-CM | POA: Diagnosis present

## 2024-07-12 DIAGNOSIS — Z7982 Long term (current) use of aspirin: Secondary | ICD-10-CM | POA: Insufficient documentation

## 2024-07-12 DIAGNOSIS — I739 Peripheral vascular disease, unspecified: Secondary | ICD-10-CM | POA: Diagnosis present

## 2024-07-12 LAB — CBC WITH DIFFERENTIAL/PLATELET
Abs Immature Granulocytes: 0.05 10*3/uL (ref 0.00–0.07)
Basophils Absolute: 0 10*3/uL (ref 0.0–0.1)
Basophils Relative: 0 %
Eosinophils Absolute: 0.1 10*3/uL (ref 0.0–0.5)
Eosinophils Relative: 1 %
HCT: 46.2 % (ref 39.0–52.0)
Hemoglobin: 14.8 g/dL (ref 13.0–17.0)
Immature Granulocytes: 0 %
Lymphocytes Relative: 19 %
Lymphs Abs: 2.1 10*3/uL (ref 0.7–4.0)
MCH: 29.8 pg (ref 26.0–34.0)
MCHC: 32 g/dL (ref 30.0–36.0)
MCV: 93 fL (ref 80.0–100.0)
Monocytes Absolute: 0.9 10*3/uL (ref 0.1–1.0)
Monocytes Relative: 8 %
Neutro Abs: 8 10*3/uL — ABNORMAL HIGH (ref 1.7–7.7)
Neutrophils Relative %: 72 %
Platelets: 209 10*3/uL (ref 150–400)
RBC: 4.97 MIL/uL (ref 4.22–5.81)
RDW: 13.3 % (ref 11.5–15.5)
WBC: 11.1 10*3/uL — ABNORMAL HIGH (ref 4.0–10.5)
nRBC: 0 % (ref 0.0–0.2)

## 2024-07-12 LAB — COMPREHENSIVE METABOLIC PANEL WITH GFR
ALT: 6 U/L (ref 0–44)
AST: 16 U/L (ref 15–41)
Albumin: 4.1 g/dL (ref 3.5–5.0)
Alkaline Phosphatase: 54 U/L (ref 38–126)
Anion gap: 12 (ref 5–15)
BUN: 20 mg/dL (ref 8–23)
CO2: 28 mmol/L (ref 22–32)
Calcium: 9.7 mg/dL (ref 8.9–10.3)
Chloride: 106 mmol/L (ref 98–111)
Creatinine, Ser: 1.54 mg/dL — ABNORMAL HIGH (ref 0.61–1.24)
GFR, Estimated: 43 mL/min — ABNORMAL LOW
Glucose, Bld: 134 mg/dL — ABNORMAL HIGH (ref 70–99)
Potassium: 5.1 mmol/L (ref 3.5–5.1)
Sodium: 146 mmol/L — ABNORMAL HIGH (ref 135–145)
Total Bilirubin: 0.5 mg/dL (ref 0.0–1.2)
Total Protein: 7.5 g/dL (ref 6.5–8.1)

## 2024-07-12 LAB — URINALYSIS, ROUTINE W REFLEX MICROSCOPIC
Bacteria, UA: NONE SEEN
Bilirubin Urine: NEGATIVE
Glucose, UA: 50 mg/dL — AB
Hgb urine dipstick: NEGATIVE
Ketones, ur: NEGATIVE mg/dL
Leukocytes,Ua: NEGATIVE
Nitrite: NEGATIVE
Protein, ur: 30 mg/dL — AB
Specific Gravity, Urine: 1.018 (ref 1.005–1.030)
pH: 5 (ref 5.0–8.0)

## 2024-07-12 LAB — ECHOCARDIOGRAM COMPLETE
Area-P 1/2: 2.87 cm2
Height: 68 in
P 1/2 time: 397 ms
S' Lateral: 2 cm
Weight: 2800 [oz_av]

## 2024-07-12 LAB — TROPONIN T, HIGH SENSITIVITY: Troponin T High Sensitivity: 22 ng/L — ABNORMAL HIGH (ref 0–19)

## 2024-07-12 LAB — GLUCOSE, CAPILLARY: Glucose-Capillary: 88 mg/dL (ref 70–99)

## 2024-07-12 MED ORDER — FAMOTIDINE IN NACL 20-0.9 MG/50ML-% IV SOLN
20.0000 mg | INTRAVENOUS | Status: DC
  Administered 2024-07-12: 20 mg via INTRAVENOUS
  Filled 2024-07-12: qty 50

## 2024-07-12 MED ORDER — AMLODIPINE BESYLATE 5 MG PO TABS
10.0000 mg | ORAL_TABLET | Freq: Every day | ORAL | Status: DC
  Administered 2024-07-12 – 2024-07-14 (×3): 10 mg via ORAL
  Filled 2024-07-12 (×3): qty 2

## 2024-07-12 MED ORDER — ENOXAPARIN SODIUM 40 MG/0.4ML IJ SOSY
40.0000 mg | PREFILLED_SYRINGE | INTRAMUSCULAR | Status: DC
  Administered 2024-07-12 – 2024-07-14 (×3): 40 mg via SUBCUTANEOUS
  Filled 2024-07-12 (×3): qty 0.4

## 2024-07-12 MED ORDER — ONDANSETRON HCL 4 MG/2ML IJ SOLN: 4.0000 mg | Freq: Four times a day (QID) | INTRAMUSCULAR | Status: DC | PRN

## 2024-07-12 MED ORDER — ALBUTEROL SULFATE (2.5 MG/3ML) 0.083% IN NEBU
2.5000 mg | INHALATION_SOLUTION | Freq: Three times a day (TID) | RESPIRATORY_TRACT | Status: DC
  Administered 2024-07-12 – 2024-07-14 (×6): 2.5 mg via RESPIRATORY_TRACT
  Filled 2024-07-12 (×6): qty 3

## 2024-07-12 MED ORDER — FENOFIBRATE 54 MG PO TABS
54.0000 mg | ORAL_TABLET | Freq: Every day | ORAL | Status: DC
  Administered 2024-07-13 – 2024-07-14 (×2): 54 mg via ORAL
  Filled 2024-07-12 (×3): qty 1

## 2024-07-12 MED ORDER — ONDANSETRON HCL 4 MG PO TABS: 4.0000 mg | ORAL_TABLET | Freq: Four times a day (QID) | ORAL | Status: DC | PRN

## 2024-07-12 MED ORDER — MECLIZINE HCL 12.5 MG PO TABS: 25.0000 mg | ORAL_TABLET | Freq: Three times a day (TID) | ORAL | Status: DC | PRN

## 2024-07-12 MED ORDER — MONTELUKAST SODIUM 10 MG PO TABS
10.0000 mg | ORAL_TABLET | Freq: Every day | ORAL | Status: DC
  Administered 2024-07-12 – 2024-07-13 (×2): 10 mg via ORAL
  Filled 2024-07-12 (×2): qty 1

## 2024-07-12 MED ORDER — SODIUM CHLORIDE 0.9% FLUSH
3.0000 mL | Freq: Two times a day (BID) | INTRAVENOUS | Status: DC
  Administered 2024-07-12 – 2024-07-14 (×4): 3 mL via INTRAVENOUS

## 2024-07-12 MED ORDER — ACETAMINOPHEN 325 MG PO TABS: 650.0000 mg | ORAL_TABLET | Freq: Four times a day (QID) | ORAL | Status: DC | PRN

## 2024-07-12 MED ORDER — SODIUM CHLORIDE 0.9 % IV BOLUS
500.0000 mL | Freq: Once | INTRAVENOUS | Status: AC
  Administered 2024-07-12: 500 mL via INTRAVENOUS

## 2024-07-12 MED ORDER — LOSARTAN POTASSIUM 25 MG PO TABS
25.0000 mg | ORAL_TABLET | Freq: Every day | ORAL | Status: DC
  Administered 2024-07-12 – 2024-07-14 (×3): 25 mg via ORAL
  Filled 2024-07-12 (×3): qty 1

## 2024-07-12 MED ORDER — BISACODYL 5 MG PO TBEC: 5.0000 mg | DELAYED_RELEASE_TABLET | Freq: Every day | ORAL | Status: DC | PRN

## 2024-07-12 MED ORDER — FAMOTIDINE 20 MG PO TABS: 20.0000 mg | ORAL_TABLET | Freq: Every day | ORAL | Status: DC | PRN

## 2024-07-12 MED ORDER — TRAZODONE HCL 50 MG PO TABS: 25.0000 mg | ORAL_TABLET | Freq: Every evening | ORAL | Status: DC | PRN

## 2024-07-12 MED ORDER — ASPIRIN 81 MG PO TBEC
81.0000 mg | DELAYED_RELEASE_TABLET | Freq: Every day | ORAL | Status: DC
  Administered 2024-07-12 – 2024-07-14 (×3): 81 mg via ORAL
  Filled 2024-07-12 (×3): qty 1

## 2024-07-12 MED ORDER — PERFLUTREN LIPID MICROSPHERE
1.0000 mL | INTRAVENOUS | Status: AC | PRN
  Administered 2024-07-12: 3 mL via INTRAVENOUS

## 2024-07-12 MED ORDER — LACTATED RINGERS IV SOLN: INTRAVENOUS | Status: DC

## 2024-07-12 MED ORDER — AZITHROMYCIN 250 MG PO TABS: 250.0000 mg | ORAL_TABLET | Freq: Every day | ORAL | 0 refills | Status: DC

## 2024-07-12 MED ORDER — ACETAMINOPHEN 650 MG RE SUPP: 650.0000 mg | Freq: Four times a day (QID) | RECTAL | Status: DC | PRN
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
## 2024-07-12 NOTE — ED Notes (Signed)
 ICU nurse called reports patient was visiting his wife when wife called out for help patient was found on the floor, alert patient has had syncope events in the past and seen yesterday. CBG 133. Potentially hit his head and unsure of blood thinners.

## 2024-07-12 NOTE — Hospital Course (Addendum)
 89 year old male with history of asthma, BPPV, coronary artery disease, hyperlipidemia, hypertension, PVD, RBBB, stage IIIb CKD, mitral valve disease and vitamin D  deficiency has been visiting his wife who is currently hospitalized yesterday evening when he had a near syncopal episode in her room and was subsequently transferred to the ED for evaluation.  He reported that he was feeling very hot and subsequently slumped over and was briefly unresponsive.  He was diaphoretic afterwards.  He was incontinent of urine.  There was no witnessed seizure activities and was not felt to be postictal.  He denied chest pain, palpitations and shortness of breath.  He had an unremarkable workup in the ED and subsequently discharged home.  He ended up staying in his wife's hospital room overnight.  His wife called out this morning to help the patient as he was found on the floor.  He was unable to get up.  His blood sugar was 133.  He is unsure if he hit his head.  He was sent back to the ED.  His orthostatic vital signs were positive.  His sodium was 146.  His high-sensitivity troponin testing was 22.  His white blood cell count was 11.1 with a normal hemoglobin of 14.8.  His urinalysis did not show signs of infection.  His EKG was stable with sinus rhythm with a right bundle branch block.  He was given a small bolus of fluids in the ED and he felt better and back to baseline at that time.  Given this is the second recurrence of syncope admission into the hospital was requested for further workup testing.  Pt reports he did not have vertigo symptoms before passing out.
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
## 2024-07-12 NOTE — Progress Notes (Signed)
*  PRELIMINARY RESULTS* Echocardiogram 2D Echocardiogram has been performed with Definity .  Teresa Aida PARAS 07/12/2024, 3:02 PM

## 2024-07-12 NOTE — ED Triage Notes (Signed)
 Pt brought to ED from ICU by nurse tech; tech reports he was found on the floor, diaphoretic, unable to stand. Pt states he tasted a sandwich from yesterday and then said he had to sit down. Vital WNL; pt A&Ox4.

## 2024-07-12 NOTE — Plan of Care (Signed)

## 2024-07-12 NOTE — Discharge Instructions (Addendum)
 It was a pleasure caring for you today in the emergency department.  Your x-ray shows possibly a mild pneumonia.  You are started on antibiotics to treat this.  Have someone stay with you until you feel stable. Do not drive, operate machinery, or play sports until feeling better. Keep all follow-up appointments as directed. Lie down right away if you start feeling like you might faint. Breathe deeply and steadily. Wait until all the symptoms have passed.Drink enough fluids to keep your urine clear or pale yellow. If you are taking blood pressure or heart medicine, get up slowly, taking several minutes to sit and then stand. This can reduce dizziness. SEEK IMMEDIATE MEDICAL CARE IF: You have a severe headache. You have unusual pain in the chest, abdomen, or back. You are bleeding from the mouth or rectum, or you have a black or tarry stool. You have an irregular or very fast heartbeat. You have pain with breathing. You have repeated fainting or seizure-like jerking during an episode. You faint when sitting or lying down. You have confusion. You have difficulty walking. You have severe weakness. You have vision problems. If you fainted, call your local emergency services - do not drive yourself to the hospital.   Please return to the emergency department immediately for any new or concerning symptoms, or if you get worse.

## 2024-07-13 ENCOUNTER — Observation Stay (HOSPITAL_COMMUNITY)

## 2024-07-13 DIAGNOSIS — E870 Hyperosmolality and hypernatremia: Secondary | ICD-10-CM | POA: Diagnosis not present

## 2024-07-13 DIAGNOSIS — I951 Orthostatic hypotension: Secondary | ICD-10-CM | POA: Diagnosis not present

## 2024-07-13 DIAGNOSIS — D72829 Elevated white blood cell count, unspecified: Secondary | ICD-10-CM

## 2024-07-13 DIAGNOSIS — I10 Essential (primary) hypertension: Secondary | ICD-10-CM

## 2024-07-13 DIAGNOSIS — N1832 Chronic kidney disease, stage 3b: Secondary | ICD-10-CM | POA: Diagnosis not present

## 2024-07-13 LAB — BASIC METABOLIC PANEL WITH GFR
Anion gap: 12 (ref 5–15)
BUN: 18 mg/dL (ref 8–23)
CO2: 24 mmol/L (ref 22–32)
Calcium: 8.9 mg/dL (ref 8.9–10.3)
Chloride: 106 mmol/L (ref 98–111)
Creatinine, Ser: 1.4 mg/dL — ABNORMAL HIGH (ref 0.61–1.24)
GFR, Estimated: 48 mL/min — ABNORMAL LOW
Glucose, Bld: 88 mg/dL (ref 70–99)
Potassium: 4.2 mmol/L (ref 3.5–5.1)
Sodium: 142 mmol/L (ref 135–145)

## 2024-07-13 LAB — LIPID PANEL
Cholesterol: 193 mg/dL (ref 0–200)
HDL: 47 mg/dL
LDL Cholesterol: 127 mg/dL — ABNORMAL HIGH (ref 0–99)
Total CHOL/HDL Ratio: 4.1 ratio
Triglycerides: 97 mg/dL
VLDL: 19 mg/dL (ref 0–40)

## 2024-07-13 LAB — CBC WITH DIFFERENTIAL/PLATELET
Abs Immature Granulocytes: 0.02 10*3/uL (ref 0.00–0.07)
Basophils Absolute: 0.1 10*3/uL (ref 0.0–0.1)
Basophils Relative: 1 %
Eosinophils Absolute: 0.2 10*3/uL (ref 0.0–0.5)
Eosinophils Relative: 3 %
HCT: 40.2 % (ref 39.0–52.0)
Hemoglobin: 13.2 g/dL (ref 13.0–17.0)
Immature Granulocytes: 0 %
Lymphocytes Relative: 30 %
Lymphs Abs: 2.5 10*3/uL (ref 0.7–4.0)
MCH: 30.5 pg (ref 26.0–34.0)
MCHC: 32.8 g/dL (ref 30.0–36.0)
MCV: 92.8 fL (ref 80.0–100.0)
Monocytes Absolute: 0.8 10*3/uL (ref 0.1–1.0)
Monocytes Relative: 9 %
Neutro Abs: 4.8 10*3/uL (ref 1.7–7.7)
Neutrophils Relative %: 57 %
Platelets: 176 10*3/uL (ref 150–400)
RBC: 4.33 MIL/uL (ref 4.22–5.81)
RDW: 13.3 % (ref 11.5–15.5)
WBC: 8.4 10*3/uL (ref 4.0–10.5)
nRBC: 0 % (ref 0.0–0.2)

## 2024-07-13 LAB — GLUCOSE, CAPILLARY: Glucose-Capillary: 100 mg/dL — ABNORMAL HIGH (ref 70–99)

## 2024-07-13 LAB — MAGNESIUM: Magnesium: 2.4 mg/dL (ref 1.7–2.4)

## 2024-07-13 MED ORDER — FAMOTIDINE 20 MG PO TABS
20.0000 mg | ORAL_TABLET | Freq: Every day | ORAL | Status: DC
  Administered 2024-07-13 – 2024-07-14 (×2): 20 mg via ORAL
  Filled 2024-07-13 (×2): qty 1

## 2024-07-13 NOTE — Plan of Care (Signed)
   Problem: Clinical Measurements: Goal: Will remain free from infection Outcome: Progressing Goal: Respiratory complications will improve Outcome: Progressing

## 2024-07-13 NOTE — Progress Notes (Signed)
 " PROGRESS NOTE   Jared Cruz  FMW:[id number] DOB: 1933/11/05 DOA: 07/12/2024 PCP: Lavell Bari LABOR, FNP   Chief Complaint  Patient presents with   Weakness   Level of care: Telemetry  Brief Admission History:  89 year old male with history of asthma, BPPV, coronary artery disease, hyperlipidemia, hypertension, PVD, RBBB, stage IIIb CKD, mitral valve disease and vitamin D  deficiency has been visiting his wife who is currently hospitalized yesterday evening when he had a near syncopal episode in her room and was subsequently transferred to the ED for evaluation.  He reported that he was feeling very hot and subsequently slumped over and was briefly unresponsive.  He was diaphoretic afterwards.  He was incontinent of urine.  There was no witnessed seizure activities and was not felt to be postictal.  He denied chest pain, palpitations and shortness of breath.  He had an unremarkable workup in the ED and subsequently discharged home.  He ended up staying in his wife's hospital room overnight.  His wife called out this morning to help the patient as he was found on the floor.  He was unable to get up.  His blood sugar was 133.  He is unsure if he hit his head.  He was sent back to the ED.  His orthostatic vital signs were positive.  His sodium was 146.  His high-sensitivity troponin testing was 22.  His white blood cell count was 11.1 with a normal hemoglobin of 14.8.  His urinalysis did not show signs of infection.  His EKG was stable with sinus rhythm with a right bundle branch block.  He was given a small bolus of fluids in the ED and he felt better and back to baseline at that time.  Given this is the second recurrence of syncope admission into the hospital was requested for further workup testing.  Pt reports he did not have vertigo symptoms before passing out.     Assessment and Plan:  Recurrent syncope  Orthostatic syncope Concern for severe MR  -- Pt had 2 syncopal episodes while visiting  wife in past 24 hours -- his orthostatic vitals were positive on admission  -- treated with IV fluid hydration -- resumed home blood pressure medications as his flat BPs are high -- syncope work up ordered with TTE, carotid dopplers study, EEG -- PT/OT evaluation requested  -- TTE suggesting concern for severe MR and need to consider TEE, will consult cardiology to see in AM to determine if TEE will be needed as part of syncope work up - -Pt agreeable to this .    Essential hypertension -- Pt says he hadn't planned on staying in hospital in wife's room overnight and so his BP meds are all at home and he hadn't taken them today -- we will restart his BP meds and monitor closely  -- requested home med reconciliation from pharmacy   Hyperlipidemia -- lipid panel pending -- he refuses statins but is on fenofibrate  which is reordered   CAD -- resume daily aspirin  81 mg   GERD -- famotidine  20 mg daily    Asthma -- stable  -- resume montelukast  daily  -- resume albuterol  bronchodilators from home   BPH -- has been on finasteride  in the past, he is not sure if still on it -- wait for pharmacy home med reconciliation   Leukocytosis -- RESOLVED  -- suspect reactive given recent fall and syncope   Hypernatremia--RESOLVED  -- secondary to dehydration -- hydrated   Stage  3b CKD -- stable, will follow   DVT prophylaxis: enoxaparin   Code Status: Full  Family Communication:  Disposition:   Consultants:  Cardiology to see 07/14/24 Procedures:   Antimicrobials:    Subjective: Pt denies any recurrent syncopal episodes; no CP, palpitations or SOB.    Objective: Vitals:   07/13/24 0124 07/13/24 0504 07/13/24 0508 07/13/24 0904  BP: (!) 141/78 135/75    Pulse: 79 69    Resp: 20 20    Temp: 98.1 F (36.7 C) 97.8 F (36.6 C)    TempSrc: Oral Oral    SpO2: 96% 99%  100%  Weight:   79.5 kg   Height:        Intake/Output Summary (Last 24 hours) at 07/13/2024 1138 Last data  filed at 07/13/2024 0800 Gross per 24 hour  Intake 1683.43 ml  Output --  Net 1683.43 ml   Filed Weights   07/12/24 1041 07/12/24 1343 07/13/24 0508  Weight: 79.4 kg 80.6 kg 79.5 kg   Examination:  General exam: Appears calm and comfortable  Respiratory system: Clear to auscultation. Respiratory effort normal. Cardiovascular system: normal S1 & S2 heard. No JVD, murmurs, rubs, gallops or clicks. No pedal edema. Gastrointestinal system: Abdomen is nondistended, soft and nontender. No organomegaly or masses felt. Normal bowel sounds heard. Central nervous system: Alert and oriented. No focal neurological deficits. Extremities: Symmetric 5 x 5 power. Skin: No rashes, lesions or ulcers. Psychiatry: Judgement and insight appear normal. Mood & affect appropriate.   Data Reviewed: I have personally reviewed following labs and imaging studies  CBC: Recent Labs  Lab 07/11/24 2040 07/12/24 1139 07/13/24 0355  WBC 8.8 11.1* 8.4  NEUTROABS  --  8.0* 4.8  HGB 14.3 14.8 13.2  HCT 44.6 46.2 40.2  MCV 93.5 93.0 92.8  PLT 205 209 176    Basic Metabolic Panel: Recent Labs  Lab 07/11/24 2040 07/12/24 1102 07/13/24 0355  NA 141 146* 142  K 4.4 5.1 4.2  CL 105 106 106  CO2 22 28 24   GLUCOSE 110* 134* 88  BUN 23 20 18   CREATININE 1.67* 1.54* 1.40*  CALCIUM  9.8 9.7 8.9  MG 2.4  --  2.4    CBG: Recent Labs  Lab 07/11/24 2351 07/12/24 1624 07/13/24 0502  GLUCAP 132* 88 100*    No results found for this or any previous visit (from the past 240 hours).   Radiology Studies: ECHOCARDIOGRAM COMPLETE Result Date: 07/12/2024    ECHOCARDIOGRAM REPORT   Patient Name:   Jared Cruz Date of Exam: 07/12/2024 Medical Rec #:  [id number]       Height:       68.0 in Accession #:    [id number]      Weight:       175.0 lb Date of Birth:  February 04, 1934      BSA:          1.931 m Patient Age:    89 years        BP:           164/76 mmHg Patient Gender: M               HR:           81 bpm. Exam  Location:  [name] Procedure: 2D Echo, Cardiac Doppler, Color Doppler and Intracardiac            Opacification Agent (Both Spectral and Color Flow Doppler were  utilized during procedure). Indications:    Syncope R55  History:        Patient has prior history of Echocardiogram examinations, most                 recent 11/03/2020. CAD, Arrythmias:RBBB; Risk Factors:Non-Smoker,                 Hypertension and Dyslipidemia. Hx of Near syncope.  Sonographer:    Aida Pizza RCS Referring Phys: [phone] Zoriah Pulice L Sharlett Lienemann IMPRESSIONS  1. Left ventricular ejection fraction, by estimation, is 65 to 70%. The left ventricle has normal function. The left ventricle has no regional wall motion abnormalities. There is moderate left ventricular hypertrophy. Left ventricular diastolic parameters are consistent with Grade I diastolic dysfunction (impaired relaxation).  2. Right ventricular systolic function is normal. The right ventricular size is mildly enlarged. There is moderately elevated pulmonary artery systolic pressure. The estimated right ventricular systolic pressure is 46.3 mmHg.  3. Tricuspid valve regurgitation is moderate.  4. The aortic valve is tricuspid. Aortic valve regurgitation is mild. Aortic valve sclerosis is present, with no evidence of aortic valve stenosis.  5. The inferior vena cava is normal in size with greater than 50% respiratory variability, suggesting right atrial pressure of 3 mmHg.  6. The mitral valve is abnormal. Moderate mitral valve regurgitation. There is mitral valve prolapse with eccentric posterior directed MR jet. Difficult to quantify MR severity due to eccentric jet. Appears significant regurgitation on MV SAX view, but low E wave velocity and normal LA/LV size argues against severe MR. Consider TEE or cardiac MRI for further evaluation FINDINGS  Left Ventricle: Left ventricular ejection fraction, by estimation, is 65 to 70%. The left ventricle has normal function. The left  ventricle has no regional wall motion abnormalities. Definity  contrast agent was given IV to delineate the left ventricular  endocardial borders. The left ventricular internal cavity size was normal in size. There is moderate left ventricular hypertrophy. Left ventricular diastolic parameters are consistent with Grade I diastolic dysfunction (impaired relaxation). Right Ventricle: The right ventricular size is mildly enlarged. No increase in right ventricular wall thickness. Right ventricular systolic function is normal. There is moderately elevated pulmonary artery systolic pressure. The tricuspid regurgitant velocity is 3.29 m/s, and with an assumed right atrial pressure of 3 mmHg, the estimated right ventricular systolic pressure is 46.3 mmHg. Left Atrium: Left atrial size was normal in size. Right Atrium: Right atrial size was normal in size. Pericardium: There is no evidence of pericardial effusion. Mitral Valve: The mitral valve is abnormal. Moderate mitral valve regurgitation. Tricuspid Valve: The tricuspid valve is normal in structure. Tricuspid valve regurgitation is moderate. Aortic Valve: The aortic valve is tricuspid. Aortic valve regurgitation is mild. Aortic regurgitation PHT measures 397 msec. Aortic valve sclerosis is present, with no evidence of aortic valve stenosis. Pulmonic Valve: The pulmonic valve was not well visualized. Pulmonic valve regurgitation is trivial. Aorta: The aortic root is normal in size and structure. Venous: The inferior vena cava is normal in size with greater than 50% respiratory variability, suggesting right atrial pressure of 3 mmHg. IAS/Shunts: The atrial septum is grossly normal.  LEFT VENTRICLE PLAX 2D LVIDd:         4.50 cm Diastology LVIDs:         2.00 cm LV e' medial:    6.22 cm/s LV PW:         1.20 cm LV E/e' medial:  13.2 LV IVS:  1.30 cm LV e' lateral:   10.60 cm/s                        LV E/e' lateral: 7.8  RIGHT VENTRICLE RV S prime:     20.30 cm/s TAPSE  (M-mode): 3.0 cm LEFT ATRIUM             Index        RIGHT ATRIUM           Index LA diam:        3.10 cm 1.61 cm/m   RA Area:     18.10 cm LA Vol (A2C):   49.5 ml 25.63 ml/m  RA Volume:   52.40 ml  27.14 ml/m LA Vol (A4C):   40.6 ml 21.03 ml/m LA Biplane Vol: 45.5 ml 23.56 ml/m  AORTIC VALVE LVOT Vmax:   84.20 cm/s LVOT Vmean:  47.900 cm/s LVOT VTI:    0.120 m AI PHT:      397 msec  AORTA Ao Root diam: 3.70 cm MITRAL VALVE                TRICUSPID VALVE MV Area (PHT): 2.87 cm     TR Peak grad:   43.3 mmHg MV Decel Time: 264 msec     TR Vmax:        329.00 cm/s MV E velocity: 82.30 cm/s MV A velocity: 102.00 cm/s  SHUNTS MV E/A ratio:  0.81         Systemic VTI: 0.12 m Lonni Nanas MD Electronically signed by Lonni Nanas MD Signature Date/Time: 07/12/2024/3:26:10 PM    Final    CT Head Wo Contrast Result Date: 07/11/2024 CLINICAL DATA:  Syncope, diaphoresis EXAM: CT HEAD WITHOUT CONTRAST TECHNIQUE: Contiguous axial images were obtained from the base of the skull through the vertex without intravenous contrast. RADIATION DOSE REDUCTION: This exam was performed according to the departmental dose-optimization program which includes automated exposure control, adjustment of the mA and/or kV according to patient size and/or use of iterative reconstruction technique. COMPARISON:  04/23/2016 FINDINGS: Brain: Diffuse cerebral cortical atrophy. No acute infarct or hemorrhage. Lateral ventricles and midline structures are unremarkable. No acute extra-axial fluid collections. No mass effect. Vascular: No hyperdense vessel or unexpected calcification. Skull: Normal. Negative for fracture or focal lesion. Sinuses/Orbits: No acute finding. Other: None. IMPRESSION: 1. No acute intracranial process. Electronically Signed   By: Ozell Daring M.D.   On: 07/11/2024 23:45   DG Chest Portable 1 View Result Date: 07/11/2024 EXAM: 1 VIEW XRAY OF THE CHEST 07/11/2024 09:18:26 PM COMPARISON: None available.  CLINICAL HISTORY: Syncope FINDINGS: LUNGS AND PLEURA: Left retrocardiac opacity. No pleural effusion or  pneumothorax. HEART AND MEDIASTINUM: No acute abnormality of the cardiac and mediastinal silhouettes. BONES AND SOFT TISSUES: Shrapnel in left chest wall. Remote left rib fractures. IMPRESSION: 1. Left retrocardiac opacity may be chronic. Pneumonia is not excluded. Electronically signed by: Norman Gatlin MD 07/11/2024 10:32 PM EST RP Workstation: [id number]   Scheduled Meds:  albuterol   2.5 mg Nebulization TID   amLODipine   10 mg Oral Daily   aspirin  EC  81 mg Oral Daily   enoxaparin  (LOVENOX ) injection  40 mg Subcutaneous Q24H   fenofibrate   54 mg Oral Daily   losartan   25 mg Oral Daily   montelukast   10 mg Oral QHS   sodium chloride  flush  3 mL Intravenous Q12H   Continuous Infusions:  famotidine  (PEPCID ) IV Stopped (07/12/24 1615)  LOS: 0 days   Time spent: 55 mins  Bahar Shelden Vicci, MD How to contact the [facility] Attending or Consulting provider 7A - 7P or covering provider during after hours 7P -7A, for this patient?  Check the care team in [facility] and look for a) attending/consulting [facility] provider listed and b) the [facility] team listed Log into [email or web address] to find provider on call.  Locate the [facility] provider you are looking for under [facility] and page to a number that you can be directly reached. If you still have difficulty reaching the provider, please page the [facility] (Director on Call) for the Hospitalists listed on amion for assistance.  07/13/2024, 11:38 AM    "

## 2024-07-13 NOTE — Care Management Obs Status (Signed)
 MEDICARE OBSERVATION STATUS NOTIFICATION   Patient Details  Name: Jared Cruz MRN: [id number] Date of Birth: 20-Jul-1933   Medicare Observation Status Notification Given:  Yes    Ronnald MARLA Sil, RN 07/13/2024, 4:32 PM

## 2024-07-13 NOTE — Evaluation (Signed)
 Physical Therapy Evaluation Patient Details Name: Jared Cruz MRN: [id number] DOB: [birth year] Today's Date: 07/13/2024  History of Present Illness  Jared Cruz is a 89 year old male with history of asthma, BPPV, coronary artery disease, hyperlipidemia, hypertension, PVD, RBBB, stage IIIb CKD, mitral valve disease and vitamin D  deficiency has been visiting his wife who is currently hospitalized yesterday evening when he had a near syncopal episode in her room and was subsequently transferred to the ED for evaluation.  He reported that he was feeling very hot and subsequently slumped over and was briefly unresponsive.  He was diaphoretic afterwards.  He was incontinent of urine.  There was no witnessed seizure activities and was not felt to be postictal.  He denied chest pain, palpitations and shortness of breath.  He had an unremarkable workup in the ED and subsequently discharged home.  He ended up staying in his wife's hospital room overnight.  His wife called out this morning to help the patient as he was found on the floor.  He was unable to get up.  His blood sugar was 133.  He is unsure if he hit his head.  He was sent back to the ED.  His orthostatic vital signs were positive.  His sodium was 146.  His high-sensitivity troponin testing was 22.  His white blood cell count was 11.1 with a normal hemoglobin of 14.8.  His urinalysis did not show signs of infection.  His EKG was stable with sinus rhythm with a right bundle branch block.  He was given a small bolus of fluids in the ED and he felt better and back to baseline at that time.  Given this is the second recurrence of syncope admission into the hospital was requested for further workup testing.  Pt reports he did not have vertigo symptoms before passing out.    Clinical Impression  Patient lying in bed on therapist arrival.  His son is at bedside.  He is agreeable to therapist assessment.  Patient performs supine to sit modified  independent/independently.  He demonstrates good sitting balance and grossly good lower extremity strength 4+ to 5/5.  Patient performs sit to stand to RW with Supervision. PT walks with him out of the room x 150 ft; at about halfway through the walk;the patient is not relying on the RW for balance so we discarded and he finished his walk without AD with no loss of balance or path deviation noted.  Patient is at baseline and does not require any further skilled PT services at this time.          If plan is discharge home, recommend the following: Assist for transportation   Can travel by private vehicle        Equipment Recommendations None recommended by PT  Recommendations for Other Services       Functional Status Assessment Patient has had a recent decline in their functional status and demonstrates the ability to make significant improvements in function in a reasonable and predictable amount of time.     Precautions / Restrictions Precautions Precautions: Fall Recall of Precautions/Restrictions: Intact Restrictions Weight Bearing Restrictions Per Provider Order: No      Mobility  Bed Mobility Overal bed mobility: Independent               Patient Response: Cooperative  Transfers Overall transfer level: Modified independent Equipment used: Rolling walker (2 wheels)  Ambulation/Gait Ambulation/Gait assistance: Supervision Gait Distance (Feet): 150 Feet Assistive device: Rolling walker (2 wheels), None Gait Pattern/deviations: WFL(Within Functional Limits)       General Gait Details: started with RW for safety but then did not rely on RW so walked 80 ft with AD and Supervision from therapist  Stairs            Wheelchair Mobility     Tilt Bed Tilt Bed Patient Response: Cooperative  Modified Rankin (Stroke Patients Only)       Balance Overall balance assessment: Modified Independent                                            Pertinent Vitals/Pain Pain Assessment Pain Assessment: No/denies pain    Home Living Family/patient expects to be discharged to:: Private residence Living Arrangements: Spouse/significant other Available Help at Discharge: Family;Available PRN/intermittently Type of Home: Apartment Home Access: Level entry       Home Layout: One level Home Equipment: Grab bars - tub/shower;Cane - single point;Cane - [employer] (2 wheels) Additional Comments: wife has equipment in the home    Prior Function Prior Level of Function : Independent/Modified Independent             Mobility Comments: walks without AD ADLs Comments: dressing and bathing independently; does not drive so family takes him shopping and to appointments     Extremity/Trunk Assessment   Upper Extremity Assessment Upper Extremity Assessment: Overall WFL for tasks assessed    Lower Extremity Assessment Lower Extremity Assessment: Overall WFL for tasks assessed    Cervical / Trunk Assessment Cervical / Trunk Assessment: Normal  Communication   Communication Communication: No apparent difficulties    Cognition Arousal: Alert Behavior During Therapy: WFL for tasks assessed/performed                             Following commands: Intact       Cueing       General Comments      Exercises     Assessment/Plan    PT Assessment Patient does not need any further PT services  PT Problem List         PT Treatment Interventions      PT Goals (Current goals can be found in the Care Plan section)  Acute Rehab PT Goals Patient Stated Goal: return home PT Goal Formulation: With patient/family Time For Goal Achievement: 07/27/24 Potential to Achieve Goals: Good    Frequency       Co-evaluation               AM-PAC PT 6 Clicks Mobility  Outcome Measure Help needed turning from your back to your side while in a flat bed without using  bedrails?: None Help needed moving from lying on your back to sitting on the side of a flat bed without using bedrails?: None Help needed moving to and from a bed to a chair (including a wheelchair)?: None Help needed standing up from a chair using your arms (e.g., wheelchair or bedside chair)?: None Help needed to walk in hospital room?: None Help needed climbing 3-5 steps with a railing? : A Little 6 Click Score: 23    End of Session   Activity Tolerance: Patient tolerated treatment well Patient left: in chair;with family/visitor present;with call bell/phone within  reach Nurse Communication: Mobility status PT Visit Diagnosis: Repeated falls (R29.6);History of falling (Z91.81)    Time: 8984-8958 PT Time Calculation (min) (ACUTE ONLY): 26 min   Charges:   PT Evaluation $PT Eval Moderate Complexity: 1 Mod   PT General Charges $$ ACUTE PT VISIT: 1 Visit         11:23 AM, 07/13/24 Jared Cruz Small Jared Cruz MPT [facility] [place] [phone] Ph:[phone]

## 2024-07-14 ENCOUNTER — Observation Stay (HOSPITAL_COMMUNITY)

## 2024-07-14 ENCOUNTER — Other Ambulatory Visit (HOSPITAL_COMMUNITY): Payer: Self-pay

## 2024-07-14 ENCOUNTER — Observation Stay (HOSPITAL_COMMUNITY): Admit: 2024-07-14 | Discharge: 2024-07-14 | Disposition: A | Attending: Family Medicine

## 2024-07-14 ENCOUNTER — Telehealth: Payer: Self-pay

## 2024-07-14 DIAGNOSIS — R55 Syncope and collapse: Secondary | ICD-10-CM

## 2024-07-14 DIAGNOSIS — I951 Orthostatic hypotension: Secondary | ICD-10-CM | POA: Diagnosis not present

## 2024-07-14 DIAGNOSIS — E870 Hyperosmolality and hypernatremia: Secondary | ICD-10-CM | POA: Diagnosis not present

## 2024-07-14 DIAGNOSIS — R569 Unspecified convulsions: Secondary | ICD-10-CM

## 2024-07-14 DIAGNOSIS — E785 Hyperlipidemia, unspecified: Secondary | ICD-10-CM | POA: Diagnosis not present

## 2024-07-14 DIAGNOSIS — H8110 Benign paroxysmal vertigo, unspecified ear: Secondary | ICD-10-CM | POA: Diagnosis not present

## 2024-07-14 DIAGNOSIS — R42 Dizziness and giddiness: Secondary | ICD-10-CM

## 2024-07-14 LAB — D-DIMER, QUANTITATIVE: D-Dimer, Quant: 0.79 ug{FEU}/mL — ABNORMAL HIGH (ref 0.00–0.50)

## 2024-07-14 LAB — GLUCOSE, CAPILLARY: Glucose-Capillary: 96 mg/dL (ref 70–99)

## 2024-07-14 MED ORDER — ALBUTEROL SULFATE (2.5 MG/3ML) 0.083% IN NEBU: 2.5000 mg | INHALATION_SOLUTION | Freq: Two times a day (BID) | RESPIRATORY_TRACT | Status: DC

## 2024-07-14 MED ORDER — NIFEDIPINE ER OSMOTIC RELEASE 30 MG PO TB24: 90.0000 mg | ORAL_TABLET | Freq: Every day | ORAL | Status: DC

## 2024-07-14 MED ORDER — LOSARTAN POTASSIUM 25 MG PO TABS: 25.0000 mg | ORAL_TABLET | Freq: Every day | ORAL | 1 refills | Status: AC

## 2024-07-14 MED ORDER — NIFEDIPINE ER OSMOTIC RELEASE 90 MG PO TB24: 90.0000 mg | ORAL_TABLET | Freq: Every day | ORAL | 1 refills | Status: AC

## 2024-07-14 NOTE — Discharge Instructions (Signed)
IMPORTANT INFORMATION: PAY CLOSE ATTENTION  ? ?PHYSICIAN DISCHARGE INSTRUCTIONS ? ?Follow with Primary care provider  Hawks, Jared Cruz, Jared Cruz  and other consultants as instructed by your Hospitalist Physician ? ?SEEK MEDICAL CARE OR RETURN TO EMERGENCY ROOM IF SYMPTOMS COME BACK, WORSEN OR NEW PROBLEM DEVELOPS  ? ?Please note: ?You were cared for by Cruz hospitalist during your hospital stay. Every effort will be made to forward records to your primary care provider.  You can request that your primary care provider send for your hospital records if they have not received them.  Once you are discharged, your primary care physician will handle any further medical issues. Please note that NO REFILLS for any discharge medications will be authorized once you are discharged, as it is imperative that you return to your primary care physician (or establish Cruz relationship with Cruz primary care physician if you do not have one) for your post hospital discharge needs so that they can reassess your need for medications and monitor your lab values. ? ?Please get Cruz complete blood count and chemistry panel checked by your Primary MD at your next visit, and again as instructed by your Primary MD. ? ?Get Medicines reviewed and adjusted: ?Please take all your medications with you for your next visit with your Primary MD ? ?Laboratory/radiological data: ?Please request your Primary MD to go over all hospital tests and procedure/radiological results at the follow up, please ask your primary care provider to get all Hospital records sent to his/her office. ? ?In some cases, they will be blood work, cultures and biopsy results pending at the time of your discharge. Please request that your primary care provider follow up on these results. ? ?If you are diabetic, please bring your blood sugar readings with you to your follow up appointment with primary care.   ? ?Please call and make your follow up appointments as soon as possible.   ? ?Also  Note the following: ?If you experience worsening of your admission symptoms, develop shortness of breath, life threatening emergency, suicidal or homicidal thoughts you must seek medical attention immediately by calling 911 or calling your MD immediately  if symptoms less severe. ? ?You must read complete instructions/literature along with all the possible adverse reactions/side effects for all the Medicines you take and that have been prescribed to you. Take any new Medicines after you have completely understood and accpet all the possible adverse reactions/side effects.  ? ?Do not drive when taking Pain medications or sleeping medications (Benzodiazepines) ? ?Do not take more than prescribed Pain, Sleep and Anxiety Medications. It is not advisable to combine anxiety,sleep and pain medications without talking with your primary care practitioner ? ?Special Instructions: If you have smoked or chewed Tobacco  in the last 2 yrs please stop smoking, stop any regular Alcohol  and or any Recreational drug use. ? ?Wear Seat belts while driving.  Do not drive if taking any narcotic, mind altering or controlled substances or recreational drugs or alcohol.  ? ? ? ? ? ?

## 2024-07-14 NOTE — Telephone Encounter (Signed)
 Sheron Lorette GRADE, PA-C  P [facility] Triage Per VM, order live zio monitor 14 days for presyncope (Primary Cardiologist: Dr. Mallipeddi).  Thank you.   Order placed, to be mailed to patient.

## 2024-07-14 NOTE — Procedures (Signed)
 Patient Name: Jared Cruz  MRN: [id number]  Epilepsy Attending: Arlin MALVA Krebs  Referring Physician/Provider: Vicci Afton CROME, MD  Date: 07/14/2024 Duration: 22.46 mins  Patient history: 89yo M with syncope. EEG to evaluate for seizure  Level of alertness: Awake, asleep   AEDs during EEG study: None  Technical aspects: This EEG study was done with scalp electrodes positioned according to the 10-20 International system of electrode placement. Electrical activity was reviewed with band pass filter of 1-70Hz , sensitivity of 7 uV/mm, display speed of 45mm/sec with a 60Hz  notched filter applied as appropriate. EEG data were recorded continuously and digitally stored.  Video monitoring was available and reviewed as appropriate.  Description: The posterior dominant rhythm consists of 8-9 Hz activity of moderate voltage (25-35 uV) seen predominantly in posterior head regions, symmetric and reactive to eye opening and eye closing. Sleep was characterized by vertex waves, sleep spindles (12 to 14 Hz), maximal frontocentral region. Hyperventilation and photic stimulation were not performed.     IMPRESSION: This study is within normal limits. No seizures or epileptiform discharges were seen throughout the recording.  A normal interictal EEG does not exclude the diagnosis of epilepsy.   Jared Cruz O [name]

## 2024-07-14 NOTE — Progress Notes (Signed)
 Routine EEG complete. Results pending.

## 2024-07-15 ENCOUNTER — Telehealth: Payer: Self-pay | Admitting: *Deleted

## 2024-07-15 ENCOUNTER — Telehealth: Payer: Self-pay

## 2024-07-15 LAB — CBG MONITORING, ED: Glucose-Capillary: 98 mg/dL (ref 70–99)

## 2024-07-16 NOTE — Transitions of Care (Post Inpatient/ED Visit) (Unsigned)
" ° °  07/16/2024  Name: Jared Cruz MRN: [id number] DOB: September 29, 1933  Today's TOC FU Call Status: Today's TOC FU Call Status:: Unsuccessful Call (2nd Attempt) Unsuccessful Call (1st Attempt) Date: 07/15/24 Unsuccessful Call (2nd Attempt) Date: 07/16/24  Attempted to reach the patient regarding the most recent Inpatient/ED visit.  Follow Up Plan: Additional outreach attempts will be made to reach the patient to complete the Transitions of Care (Post Inpatient/ED visit) call.   Signature Julian Lemmings, LPN [facility] Nurse Health Advisor Direct Dial [phone]  "

## 2024-07-17 NOTE — Transitions of Care (Post Inpatient/ED Visit) (Signed)
" ° °  07/17/2024  Name: Jared Cruz MRN: [id number] DOB: 1933-11-13  Today's TOC FU Call Status: Today's TOC FU Call Status:: Unsuccessful Call (3rd Attempt) Unsuccessful Call (1st Attempt) Date: 07/15/24 Unsuccessful Call (2nd Attempt) Date: 07/16/24 Unsuccessful Call (3rd Attempt) Date: 07/17/24  Attempted to reach the patient regarding the most recent Inpatient/ED visit.  Follow Up Plan: No further outreach attempts will be made at this time. We have been unable to contact the patient.  Signature Julian Lemmings, LPN [facility] Nurse Health Advisor Direct Dial [phone]  "

## 2024-08-25 ENCOUNTER — Ambulatory Visit: Admitting: Physician Assistant

## 2024-10-14 ENCOUNTER — Ambulatory Visit
